# Patient Record
Sex: Female | Born: 1965 | Race: Black or African American | Hispanic: No | Marital: Married | Smoking: Never smoker
Health system: Southern US, Community
[De-identification: ages and names within clinical notes are randomized; demographics above are authoritative.]

## PROBLEM LIST (undated history)

## (undated) DIAGNOSIS — K759 Inflammatory liver disease, unspecified: Secondary | ICD-10-CM

## (undated) DIAGNOSIS — R011 Cardiac murmur, unspecified: Secondary | ICD-10-CM

## (undated) DIAGNOSIS — K219 Gastro-esophageal reflux disease without esophagitis: Secondary | ICD-10-CM

## (undated) DIAGNOSIS — M75100 Unspecified rotator cuff tear or rupture of unspecified shoulder, not specified as traumatic: Secondary | ICD-10-CM

## (undated) DIAGNOSIS — K259 Gastric ulcer, unspecified as acute or chronic, without hemorrhage or perforation: Secondary | ICD-10-CM

## (undated) DIAGNOSIS — Z9289 Personal history of other medical treatment: Secondary | ICD-10-CM

## (undated) DIAGNOSIS — I10 Essential (primary) hypertension: Secondary | ICD-10-CM

## (undated) DIAGNOSIS — R110 Nausea: Secondary | ICD-10-CM

## (undated) DIAGNOSIS — M7750 Other enthesopathy of unspecified foot: Secondary | ICD-10-CM

## (undated) DIAGNOSIS — T7840XA Allergy, unspecified, initial encounter: Secondary | ICD-10-CM

## (undated) DIAGNOSIS — M7660 Achilles tendinitis, unspecified leg: Secondary | ICD-10-CM

## (undated) DIAGNOSIS — M869 Osteomyelitis, unspecified: Secondary | ICD-10-CM

## (undated) DIAGNOSIS — M1990 Unspecified osteoarthritis, unspecified site: Secondary | ICD-10-CM

## (undated) DIAGNOSIS — K8020 Calculus of gallbladder without cholecystitis without obstruction: Secondary | ICD-10-CM

## (undated) HISTORY — DX: Allergy, unspecified, initial encounter: T78.40XA

## (undated) HISTORY — DX: Other enthesopathy of unspecified foot and ankle: M77.50

## (undated) HISTORY — DX: Calculus of gallbladder without cholecystitis without obstruction: K80.20

## (undated) HISTORY — DX: Unspecified rotator cuff tear or rupture of unspecified shoulder, not specified as traumatic: M75.100

## (undated) HISTORY — DX: Achilles tendinitis, unspecified leg: M76.60

## (undated) HISTORY — PX: OTHER SURGICAL HISTORY: SHX169

## (undated) HISTORY — DX: Gastric ulcer, unspecified as acute or chronic, without hemorrhage or perforation: K25.9

## (undated) HISTORY — DX: Nausea: R11.0

---
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
## 1986-01-15 HISTORY — PX: OTHER SURGICAL HISTORY: SHX169

## 1999-11-01 ENCOUNTER — Emergency Department (HOSPITAL_COMMUNITY): Admission: EM | Admit: 1999-11-01 | Discharge: 1999-11-01 | Payer: Self-pay | Admitting: Internal Medicine

## 2004-05-01 ENCOUNTER — Emergency Department (HOSPITAL_COMMUNITY): Admission: EM | Admit: 2004-05-01 | Discharge: 2004-05-01 | Payer: Self-pay | Admitting: Emergency Medicine

## 2004-05-12 ENCOUNTER — Ambulatory Visit: Payer: Self-pay | Admitting: Cardiology

## 2004-05-12 ENCOUNTER — Encounter: Payer: Self-pay | Admitting: Cardiology

## 2004-05-12 ENCOUNTER — Inpatient Hospital Stay (HOSPITAL_COMMUNITY): Admission: AD | Admit: 2004-05-12 | Discharge: 2004-05-16 | Payer: Self-pay | Admitting: Internal Medicine

## 2004-05-17 ENCOUNTER — Other Ambulatory Visit: Admission: RE | Admit: 2004-05-17 | Discharge: 2004-05-17 | Payer: Self-pay | Admitting: Obstetrics and Gynecology

## 2005-03-21 ENCOUNTER — Ambulatory Visit (HOSPITAL_COMMUNITY): Admission: RE | Admit: 2005-03-21 | Discharge: 2005-03-21 | Payer: Self-pay | Admitting: Internal Medicine

## 2005-05-28 ENCOUNTER — Other Ambulatory Visit: Admission: RE | Admit: 2005-05-28 | Discharge: 2005-05-28 | Payer: Self-pay | Admitting: Obstetrics and Gynecology

## 2006-05-30 ENCOUNTER — Other Ambulatory Visit: Admission: RE | Admit: 2006-05-30 | Discharge: 2006-05-30 | Payer: Self-pay | Admitting: Obstetrics and Gynecology

## 2006-05-30 ENCOUNTER — Ambulatory Visit (HOSPITAL_COMMUNITY): Admission: RE | Admit: 2006-05-30 | Discharge: 2006-05-30 | Payer: Self-pay | Admitting: Obstetrics and Gynecology

## 2007-07-12 ENCOUNTER — Emergency Department: Payer: Self-pay | Admitting: Unknown Physician Specialty

## 2007-07-30 ENCOUNTER — Ambulatory Visit (HOSPITAL_COMMUNITY): Admission: RE | Admit: 2007-07-30 | Discharge: 2007-07-30 | Payer: Self-pay | Admitting: Unknown Physician Specialty

## 2008-10-20 ENCOUNTER — Ambulatory Visit (HOSPITAL_COMMUNITY): Admission: RE | Admit: 2008-10-20 | Discharge: 2008-10-20 | Payer: Self-pay | Admitting: Unknown Physician Specialty

## 2008-11-05 ENCOUNTER — Encounter: Admission: RE | Admit: 2008-11-05 | Discharge: 2008-11-05 | Payer: Self-pay | Admitting: Unknown Physician Specialty

## 2009-10-24 ENCOUNTER — Ambulatory Visit (HOSPITAL_COMMUNITY): Admission: RE | Admit: 2009-10-24 | Discharge: 2009-10-24 | Payer: Self-pay | Admitting: Unknown Physician Specialty

## 2010-02-05 ENCOUNTER — Encounter: Payer: Self-pay | Admitting: Unknown Physician Specialty

## 2010-06-02 NOTE — H&P (Signed)
Taylor Hurst, Taylor Hurst             ACCOUNT NO.:  [id number]   MEDICAL RECORD NO.:  [id number]          PATIENT TYPE:  EMS   LOCATION:  MAJO                         FACILITY:  [facility]   PHYSICIAN:  Ladell Pier, M.D.   DATE OF BIRTH:  December 12, 1965   DATE OF ADMISSION:  05/01/2004  DATE OF DISCHARGE:  05/01/2004                                HISTORY & PHYSICAL   CHIEF COMPLAINT:  High blood pressure.   HISTORY OF PRESENT ILLNESS:  The patient is a 45 year old African American  female who has no significant past medical history. She presented to the GYN  office on April 17th with elevated blood pressure. She was sent from GYN  office to the emergency room. In the ED she was given a total of 0.5 mg of  clonidine, blood pressure went from 244/139 to 159/115. She was discharged  on clonidine 0.2 mg b.i.d. In our office her blood pressure was 230/140  after treatment and Diovan/HCTZ 160/25 with blood pressure 240/130. The  patient was admitted for blood pressure control. Blood pressure in the  office also went up as high as 240/170. She had no symptoms of chest pain.  No shortness of breath, headache, dizziness.   PAST MEDICAL HISTORY:  Newly diagnosed hypertension. She also has a history  of osteomyelitis of bilateral lower extremities, the left leg in August 1993  and the right in July 1988.   FAMILY HISTORY:  Her mother has hypertension and her brother has  hypertension. She has a sister in good health.   MEDICATIONS:  1.  Clonidine 0.1 mg b.i.d.  2.  Vitamin C daily.  3.  Zinc daily.  4.  Aspirin daily.   ALLERGIES:  She is allergic to PENICILLIN and BETADINE.   REVIEW OF SYSTEMS:  Negative other than was stated in the HPI.   PHYSICAL EXAMINATION:  VITAL SIGNS: Temperature 98.4, pulse 64 and recheck  136, respirations 12, blood pressure on recheck 230/140. Pulse oximetry 94%  on room air.  HEENT: Head is normocephalic and atraumatic. Pupils equal, round, and  reactive to  light. Throat without erythema.  CARDIOVASCULAR: Regular rate and rhythm. 2/6 systolic murmur.  LUNGS: Clear to auscultation bilaterally. No rales, rhonchi, or wheezes.  ABDOMEN: Soft, nontender, nondistended. Positive bowel sounds.  EXTREMITIES: Trace edema.   LABORATORY DATA:  She is getting CBC, CMP, urine drug screen, fasting lipid  panel, and TSH.   ASSESSMENT/PLAN:  1.  Hypertension. Her blood pressure is severely elevated, even though she      does not have any symptoms. She was recently in the ED, started on      clonidine, and is not responding to clonidine. She was given clonidine      in the office and is not responding to clonidine, so I will admit the      patient to observation, treat her hypertension, and work her up for this      hypertension and put her on good medication control. Obviously we will      have to get her blood pressure under control over a period of time.  2.  Left ventricular hypertrophy with left ventricular strain on      echocardiogram, plus murmur. This will improve with blood pressure      control. Will check 2-D echo and repeat and try to control blood      pressure.      NJ/MEDQ  D:  05/12/2004  T:  05/12/2004  Job:  [id number]

## 2010-06-02 NOTE — Discharge Summary (Signed)
NAMEMALYNDA, Taylor Hurst             ACCOUNT NO.:  [id number]   MEDICAL RECORD NO.:  [id number]          PATIENT TYPE:  INP   LOCATION:  4734                         FACILITY:  [facility]   PHYSICIAN:  Ladell Pier, M.D.   DATE OF BIRTH:  02-05-1965   DATE OF ADMISSION:  05/12/2004  DATE OF DISCHARGE:  05/16/2004                                 DISCHARGE SUMMARY   DISCHARGE DIAGNOSES:  1. Hypertensive urgency.  2. Left ventricular hypertrophy with left ventricular strain.  2-D      echocardiogram showed left ventricular hypertrophy, normal      contractility.     DISCHARGE MEDICATIONS:  1. Diovan/HCTZ 160/25 once a day.  2. Norvasc 10 mg twice a day.     FOLLOW-UP APPOINTMENT:  Patient to follow up in the office within one week.  Patient may return to work on May 18, 2004.   HISTORY OF PRESENT ILLNESS:  The patient is a 45 year old African-American  female.  She had no significant past medical history.  She has represented  to the GYN office on April 17 with elevated blood pressure.  She was sent to  the GYN office to the emergency room where she was treated for hypertension.  She presented to our office with elevated blood pressure in the 240s/130s.  We admitted her to the hospital for treatment of her hypertension.  Past  medical history, family history, social history, medication allergies,  review of systems per admission H&P.   PHYSICAL EXAMINATION:  VITAL SIGNS:  On discharge blood pressure temperature  98.6, pulse 67, respirations 16, blood pressure 125/73, pulse ox 93% on room  air.  HEENT:  Normocephalic, atraumatic.  Pupils are equal, round, and reactive to  light.  Throat without erythema.  CARDIOVASCULAR:  Regular rate and rhythm with a 2/6 systolic murmur.  ABDOMEN:  Positive bowel sounds.  EXTREMITIES:  Without edema.   HOSPITAL COURSE:  #1 - HYPERTENSIVE URGENCY:  She was admitted to the  stepdown unit where she was given IV labetalol to control her blood  pressure.  Her blood pressure took several days to respond.  She developed a  headache during the treatment of her hypertension.  On the second her blood  pressure had normalized to 125/73.  The medications were further adjusted  and she was discharged on the medications listed above.   #2 - HEART MURMUR:  She had an echocardiogram done that showed normal EF.  LVH with mildly dilated left atrium.   DISCHARGE LABORATORIES:  Sodium 139, potassium 3.7, creatinine 1.0, glucose  84.  WBC 6.8, hemoglobin 12.7, platelets 298.  Cholesterol:  Triglycerides  107, total cholesterol 193, HDL 53, LDL 119.  TSH was normal.  UDS was  negative.  TSH 0.842.  UA was also normal.  2-D echocardiogram showed an EF  of 55-65%.  There was no left ventricular regional wall motion abnormality.  LV wall thickness mildly increased and left atrium mildly dilated.      NJ/MEDQ  D:  05/16/2004  T:  05/16/2004  Job:  [id number]

## 2010-09-19 ENCOUNTER — Other Ambulatory Visit (HOSPITAL_COMMUNITY): Payer: Self-pay | Admitting: Unknown Physician Specialty

## 2010-09-19 DIAGNOSIS — Z1231 Encounter for screening mammogram for malignant neoplasm of breast: Secondary | ICD-10-CM

## 2010-10-27 ENCOUNTER — Ambulatory Visit (HOSPITAL_COMMUNITY)
Admission: RE | Admit: 2010-10-27 | Discharge: 2010-10-27 | Disposition: A | Discharge status: Home / Self Care | Payer: Managed Care, Other (non HMO) | Source: Ambulatory Visit | Attending: Unknown Physician Specialty | Admitting: Unknown Physician Specialty

## 2010-10-27 DIAGNOSIS — Z1231 Encounter for screening mammogram for malignant neoplasm of breast: Secondary | ICD-10-CM | POA: Insufficient documentation

## 2011-05-30 ENCOUNTER — Encounter (INDEPENDENT_AMBULATORY_CARE_PROVIDER_SITE_OTHER): Payer: Self-pay | Admitting: Surgery

## 2011-05-30 ENCOUNTER — Ambulatory Visit (INDEPENDENT_AMBULATORY_CARE_PROVIDER_SITE_OTHER): Payer: Managed Care, Other (non HMO) | Admitting: Surgery

## 2011-05-30 VITALS — BP 148/82 | HR 72 | Temp 97.4°F | Resp 18 | Ht 62.0 in | Wt 200.6 lb

## 2011-05-30 DIAGNOSIS — R12 Heartburn: Secondary | ICD-10-CM

## 2011-05-30 DIAGNOSIS — R748 Abnormal levels of other serum enzymes: Secondary | ICD-10-CM

## 2011-05-30 DIAGNOSIS — G4700 Insomnia, unspecified: Secondary | ICD-10-CM | POA: Insufficient documentation

## 2011-05-30 DIAGNOSIS — K732 Chronic active hepatitis, not elsewhere classified: Secondary | ICD-10-CM | POA: Insufficient documentation

## 2011-05-30 DIAGNOSIS — I10 Essential (primary) hypertension: Secondary | ICD-10-CM | POA: Insufficient documentation

## 2011-05-30 DIAGNOSIS — K219 Gastro-esophageal reflux disease without esophagitis: Secondary | ICD-10-CM | POA: Insufficient documentation

## 2011-05-30 DIAGNOSIS — K8020 Calculus of gallbladder without cholecystitis without obstruction: Secondary | ICD-10-CM

## 2011-05-30 DIAGNOSIS — K811 Chronic cholecystitis: Secondary | ICD-10-CM | POA: Insufficient documentation

## 2011-05-30 DIAGNOSIS — E669 Obesity, unspecified: Secondary | ICD-10-CM | POA: Insufficient documentation

## 2011-05-30 HISTORY — DX: Calculus of gallbladder without cholecystitis without obstruction: K80.20

## 2011-05-30 NOTE — Progress Notes (Signed)
Subjective:     Patient ID: Taylor Hurst, female   DOB: 04-22-1965, 46 y.o.   MRN: [id number]  HPI  Taylor Hurst  Nov 15, 1965 [id number]  Patient Care Team: Lavell Islam, MD as PCP - General (Internal Medicine) Theda Belfast, MD as Consulting Physician (Gastroenterology)  This patient is a 46 y.o.female who presents today for surgical evaluation at the request of Dr. Elnoria Howard.   Reason for visit: Elevated liver function tests and gallstones. Possible biliary etiology.  Patient is an obese pleasant female. She's had episodes of intermittent abdominal discomfort and nausea. Occasional pain. Usually supraumbilical. Right or worse than left. She tends to have a high-fiber diet and tries to avoid fatty greasy foods. She went into a care facility. She had elevated liver function test. She'll followup with gastroenterologist. Dr. Elnoria Howard was concern about possible biliary etiology. He sent the patient to [place] for surgical evaluation. An ultrasound showed gallstones. The ultrasound repeated due to imaging issues & I do not have the full report. Her liver function tests were improved but not normal.  The patient notes she gets some mild reflux but no true heartburn (?!). She had a trial of omeprazole and seemed to help. However, she still gets an occasional symptoms of nausea and bloating. It is rather annoying. Feels the right upper quadrant swells up.  Normally has a bowel movement every day. Rather physically active. No history of abdominal surgeries.  Patient Active Problem List  Diagnoses  . HTN (hypertension)  . Insomnia  . Obesity (BMI 30-39.9)  . Abnormal transaminases  . Gallstones, probabale biliary colic  . Heartburn    Past Medical History  Diagnosis Date  . Nausea     Past Surgical History  Procedure Date  . Osteomilitis     right and left leg/surgery; right 1983; left 1988    History   Social History  . Marital Status: Married    Spouse Name: N/A    Number of Children:  N/A  . Years of Education: N/A   Occupational History  . Not on file.   Social History Main Topics  . Smoking status: Never Smoker   . Smokeless tobacco: Not on file  . Alcohol Use: Yes  . Drug Use:   . Sexually Active:    Other Topics Concern  . Not on file   Social History Narrative  . No narrative on file    Family History  Problem Relation Age of Onset  . COPD Mother   . Hypertension Mother   . Hyperlipidemia Mother   . Alcohol abuse Father   . Cancer Maternal Aunt     breast    Current Outpatient Prescriptions  Medication Sig Dispense Refill  . amLODipine (NORVASC) 10 MG tablet Take 10 mg by mouth daily.      Marland Kitchen b complex vitamins tablet Take 1 tablet by mouth daily.      . Black Cohosh 540 MG CAPS Take by mouth daily.      . fish oil-omega-3 fatty acids 1000 MG capsule Take 1,000 mg by mouth daily.      Marland Kitchen levonorgestrel (MIRENA) 20 MCG/24HR IUD 1 each by Intrauterine route once. 20 mcg/24 hr IUD      . Melatonin 5 MG CAPS Take by mouth. Takes 2 at bedtime      . Multiple Vitamin (MULTI-VITAMIN DAILY PO) Take by mouth. Women's      . omeprazole (PRILOSEC) 20 MG capsule Take 20 mg by mouth daily.      Marland Kitchen  valsartan-hydrochlorothiazide (DIOVAN-HCT) 320-25 MG per tablet Take 1 tablet by mouth daily.         Allergies not on file  BP 148/82  Pulse 72  Temp(Src) 97.4 F (36.3 C) (Temporal)  Resp 18  Ht 5\' 2"  (1.575 m)  Wt 200 lb 9.6 oz (90.992 kg)  BMI 36.69 kg/m2  No results found.   Review of Systems  Constitutional: Negative for fever, chills, diaphoresis, appetite change and fatigue.  HENT: Negative for ear pain, sore throat, trouble swallowing, neck pain and ear discharge.   Eyes: Negative for photophobia, discharge and visual disturbance.  Respiratory: Negative for cough, choking, chest tightness and shortness of breath.   Cardiovascular: Negative for chest pain and palpitations.  Gastrointestinal: Negative for nausea, vomiting, abdominal pain,  diarrhea, constipation, anal bleeding and rectal pain.  Genitourinary: Negative for dysuria, frequency and difficulty urinating.  Musculoskeletal: Negative for myalgias and gait problem.  Skin: Negative for color change, pallor and rash.  Neurological: Negative for dizziness, speech difficulty, weakness and numbness.  Hematological: Negative for adenopathy.  Psychiatric/Behavioral: Negative for confusion and agitation. The patient is not nervous/anxious.        Objective:   Physical Exam  Constitutional: She is oriented to person, place, and time. She appears well-developed and well-nourished. No distress.  HENT:  Head: Normocephalic.  Mouth/Throat: Oropharynx is clear and moist. No oropharyngeal exudate.  Eyes: Conjunctivae and EOM are normal. Pupils are equal, round, and reactive to light. No scleral icterus.  Neck: Normal range of motion. Neck supple. No tracheal deviation present.  Cardiovascular: Normal rate, regular rhythm and intact distal pulses.   Pulmonary/Chest: Effort normal and breath sounds normal. No respiratory distress. She exhibits no tenderness.  Abdominal: Soft. She exhibits no distension and no mass. There is tenderness in the epigastric area. There is no CVA tenderness and negative Murphy's sign. No hernia. Hernia confirmed negative in the right inguinal area and confirmed negative in the left inguinal area.         Obese  Genitourinary: No vaginal discharge found.  Musculoskeletal: Normal range of motion. She exhibits no tenderness.  Lymphadenopathy:    She has no cervical adenopathy.       Right: No inguinal adenopathy present.       Left: No inguinal adenopathy present.  Neurological: She is alert and oriented to person, place, and time. No cranial nerve deficit. She exhibits normal muscle tone. Coordination normal.  Skin: Skin is warm and dry. No rash noted. She is not diaphoretic. No erythema.  Psychiatric: She has a normal mood and affect. Her behavior is  normal. Judgment and thought content normal.       Assessment:     Elevated liver function tests.  Gallstones  Mild heartburn  I suspect she has biliary colic on top of heartburn issues. Possible fatty change in liver.    Plan:      Because she's had elevated liver function tests, right-sided swelling bloating pain, and persistent symptoms even while on omeprazole; I think the gallbladder is at least part of the problem.  I want to get the final report on the ultrasound. At the initial liver function tests. I would have a low threshold to consider a Tru-Cut core liver biopsy at the time of surgery if it looks abnormal.  I think she would benefit cholecystectomy. Very seen a gastroenterologist. I noted if she wished to have upper endoscopy in a more aggressive GI evaluation, that is reasonable. However she does not give  me any other symptoms such as cardiopulmonary issues her gastroparesis. No history of hepatitis or every alcohol use. No abnormal medicines. She is leaning towards proceeding with surgery.  The anatomy & physiology of hepatobiliary & pancreatic function was discussed.  The pathophysiology of gallbladder dysfunction was discussed.  Natural history risks without surgery was discussed.   I feel the risks of no intervention will lead to serious problems that outweigh the operative risks; therefore, I recommended cholecystectomy to remove the pathology.  I explained laparoscopic techniques with possible need for an open approach.  Probable cholangiogram to evaluate the bilary tract was explained as well.    Risks such as bleeding, infection, abscess, leak, injury to other organs, need for further treatment, heart attack, death, and other risks were discussed.  I noted a good likelihood this will help address the problem.  Possibility that this will not correct all abdominal symptoms was explained.  Goals of post-operative recovery were discussed as well.  We will work to minimize  complications.  An educational handout further explaining the pathology and treatment options was given as well.  Questions were answered.  The patient expresses understanding & wishes to proceed with surgery.

## 2011-05-30 NOTE — Patient Instructions (Addendum)
See the Handout(s) we gave you.  Consider surgery.  Please call our office at [phone] if you wish to schedule surgery or if you have further questions / concerns.    Cholelithiasis Cholelithiasis (also called gallstones) is a form of gallbladder disease where gallstones form in your gallbladder. The gallbladder is a non-essential organ that stores bile made in the liver, which helps digest fats. Gallstones begin as small crystals and slowly grow into stones. Gallstone pain occurs when the gallbladder spasms, and a gallstone is blocking the duct. Pain can also occur when a stone passes out of the duct.  Women are more likely to develop gallstones than men. Other factors that increase the risk of gallbladder disease are:  Having multiple pregnancies. Physicians sometimes advise removing diseased gallbladders before future pregnancies.   Obesity.   Diets heavy in fried foods and fat.   Increasing age (older than 15).   Prolonged use of medications containing female hormones.   Diabetes mellitus.   Rapid weight loss.   Family history of gallstones (heredity).  SYMPTOMS  Feeling sick to your stomach (nauseous).   Abdominal pain.   Yellowing of the skin (jaundice).   Sudden pain. It may persist from several minutes to several hours.   Worsening pain with deep breathing or when jarred.   Fever.   Tenderness to the touch.  In some cases, when gallstones do not move into the bile duct, people have no pain or symptoms. These are called "silent" gallstones. TREATMENT In severe cases, emergency surgery may be required. HOME CARE INSTRUCTIONS   Only take over-the-counter or prescription medicines for pain, discomfort, or fever as directed by your caregiver.   Follow a low-fat diet until seen again. Fat causes the gallbladder to contract, which can result in pain.   Follow up as instructed. Attacks are almost always recurrent and surgery is usually required for permanent  treatment.  SEEK IMMEDIATE MEDICAL CARE IF:   Your pain increases and is not controlled by medications.   You have an oral temperature above 102 F (38.9 C), not controlled by medication.   You develop nausea and vomiting.  MAKE SURE YOU:   Understand these instructions.   Will watch your condition.   Will get help right away if you are not doing well or get worse.  Document Released: 12/28/2004 Document Revised: 12/21/2010 Document Reviewed: 03/02/2010 [facility] Patient Information 2012 [place], [place].  Heartburn Heartburn is a painful, burning sensation in the chest. It may feel worse in certain positions, such as lying down or bending over. It is caused by stomach acid backing up into the tube that carries food from the mouth down to the stomach (lower esophagus).  CAUSES   Large meals.   Certain foods and drinks.   Exercise.   Increased acid production.   Being overweight or obese.   Certain medicines.  SYMPTOMS   Burning pain in the chest or lower throat.   Bitter taste in the mouth.   Coughing.  DIAGNOSIS  If the usual treatments for heartburn do not improve your symptoms, then tests may be done to see if there is another condition present. Possible tests may include:  X-rays.   Endoscopy. This is when a tube with a light and a camera on the end is used to examine the esophagus and the stomach.   A test to measure the amount of acid in the esophagus (pH test).   A test to see if the esophagus is working properly (  esophageal manometry).   Blood, breath, or stool tests to check for bacteria that cause ulcers.  TREATMENT   Your caregiver may tell you to use certain over-the-counter medicines (antacids, acid reducers) for mild heartburn.   Your caregiver may prescribe medicines to decrease the acid in your stomach or protect your stomach lining.   Your caregiver may recommend certain diet changes.   For severe cases, your caregiver may recommend that the  head of your bed be elevated on blocks. (Sleeping with more pillows is not an effective treatment as it only changes the position of your head and does not improve the main problem of stomach acid refluxing into the esophagus.)  HOME CARE INSTRUCTIONS   Take all medicines as directed by your caregiver.   Raise the head of your bed by putting blocks under the legs if instructed to by your caregiver.   Do not exercise right after eating.   Avoid eating 2 or 3 hours before bed. Do not lie down right after eating.   Eat small meals throughout the day instead of 3 large meals.   Stop smoking if you smoke.   Maintain a healthy weight.   Identify foods and beverages that make your symptoms worse and avoid them. Foods you may want to avoid include:   Peppers.   Chocolate.   High-fat foods, including fried foods.   Spicy foods.   Garlic and onions.   Citrus fruits, including oranges, grapefruit, lemons, and limes.   Food containing tomatoes or tomato products.   Mint.   Carbonated drinks, caffeinated drinks, and alcohol.   Vinegar.  SEEK IMMEDIATE MEDICAL CARE IF:  You have severe chest pain that goes down your arm or into your jaw or neck.   You feel sweaty, dizzy, or lightheaded.   You are short of breath.   You vomit blood.   You have difficulty or pain with swallowing.   You have bloody or black, tarry stools.   You have episodes of heartburn more than 3 times a week for more than 2 weeks.  MAKE SURE YOU:  Understand these instructions.   Will watch your condition.   Will get help right away if you are not doing well or get worse.  Document Released: 05/20/2008 Document Revised: 12/21/2010 Document Reviewed: 06/18/2010 [facility] Patient Information 2012 [place], [place].

## 2011-06-06 ENCOUNTER — Encounter (INDEPENDENT_AMBULATORY_CARE_PROVIDER_SITE_OTHER): Payer: Self-pay

## 2011-07-13 ENCOUNTER — Encounter (INDEPENDENT_AMBULATORY_CARE_PROVIDER_SITE_OTHER): Payer: Self-pay

## 2011-07-13 ENCOUNTER — Encounter (HOSPITAL_COMMUNITY): Payer: Self-pay | Admitting: Pharmacy Technician

## 2011-07-18 ENCOUNTER — Ambulatory Visit (HOSPITAL_COMMUNITY)
Admission: RE | Admit: 2011-07-18 | Discharge: 2011-07-18 | Disposition: A | Discharge status: Home / Self Care | Payer: Managed Care, Other (non HMO) | Source: Ambulatory Visit | Attending: Surgery | Admitting: Surgery

## 2011-07-18 ENCOUNTER — Encounter (HOSPITAL_COMMUNITY): Payer: Self-pay

## 2011-07-18 ENCOUNTER — Encounter (HOSPITAL_COMMUNITY)
Admission: RE | Admit: 2011-07-18 | Discharge: 2011-07-18 | Disposition: A | Discharge status: Home / Self Care | Payer: Managed Care, Other (non HMO) | Source: Ambulatory Visit | Attending: Surgery | Admitting: Surgery

## 2011-07-18 DIAGNOSIS — Z01812 Encounter for preprocedural laboratory examination: Secondary | ICD-10-CM | POA: Insufficient documentation

## 2011-07-18 DIAGNOSIS — Z01810 Encounter for preprocedural cardiovascular examination: Secondary | ICD-10-CM | POA: Insufficient documentation

## 2011-07-18 DIAGNOSIS — K8020 Calculus of gallbladder without cholecystitis without obstruction: Secondary | ICD-10-CM | POA: Insufficient documentation

## 2011-07-18 HISTORY — DX: Unspecified osteoarthritis, unspecified site: M19.90

## 2011-07-18 HISTORY — DX: Gastro-esophageal reflux disease without esophagitis: K21.9

## 2011-07-18 HISTORY — DX: Essential (primary) hypertension: I10

## 2011-07-18 LAB — CBC
MCV: 90.9 fL (ref 78.0–100.0)
Platelets: 318 10*3/uL (ref 150–400)
RBC: 4.3 MIL/uL (ref 3.87–5.11)
RDW: 12.7 % (ref 11.5–15.5)
WBC: 6.8 10*3/uL (ref 4.0–10.5)

## 2011-07-18 LAB — BASIC METABOLIC PANEL
CO2: 30 mEq/L (ref 19–32)
Chloride: 100 mEq/L (ref 96–112)
Creatinine, Ser: 0.85 mg/dL (ref 0.50–1.10)
GFR calc Af Amer: >90 mL/min (ref >90)
Potassium: 4.2 mEq/L (ref 3.5–5.1)
Sodium: 138 mEq/L (ref 135–145)

## 2011-07-18 LAB — SURGICAL PCR SCREEN: Staphylococcus aureus: NEGATIVE

## 2011-07-18 LAB — HCG, SERUM, QUALITATIVE: Preg, Serum: NEGATIVE

## 2011-07-18 NOTE — Patient Instructions (Signed)
20 Taylor Hurst  07/18/2011   Your procedure is scheduled on:  Thursday 07/26/2011 at 0730 am  Report to [facility] at 0530 AM.  Call this number if you have problems the morning of surgery: [phone]   Remember:   Do not eat food:After Midnight.  May have clear liquids:until Midnight .    Take these medicines the morning of surgery with A SIP OF WATER: Norvasc, Prilosec   Do not wear jewelry, make-up or nail polish.  Do not wear lotions, powders, or perfumes.   Do not shave 48 hours prior to surgery. Men may shave face and neck.  Do not bring valuables to the hospital.  Contacts, dentures or bridgework may not be worn into surgery.       Patients discharged the day of surgery will not be allowed to drive home.  Name and phone number of your driver: Sherrilee Gilles [phone]  Special Instructions: CHG Shower Use Special Wash: 1/2 bottle night before surgery and 1/2 bottle morning of surgery.   Please read over the following fact sheets that you were given: MRSA Information, Sleep apnea sheet, Incentive Spirometry Sheet                If you have any questions, call Telford Nab.Georgeanna Lea, RN,BSN at [phone]

## 2011-07-26 ENCOUNTER — Ambulatory Visit (HOSPITAL_COMMUNITY)
Admission: RE | Admit: 2011-07-26 | Discharge: 2011-07-26 | Disposition: A | Discharge status: Home / Self Care | Payer: Managed Care, Other (non HMO) | Source: Ambulatory Visit | Attending: Surgery | Admitting: Surgery

## 2011-07-26 ENCOUNTER — Ambulatory Visit (HOSPITAL_COMMUNITY): Payer: Managed Care, Other (non HMO) | Admitting: Registered Nurse

## 2011-07-26 ENCOUNTER — Encounter (HOSPITAL_COMMUNITY): Payer: Self-pay | Admitting: Registered Nurse

## 2011-07-26 ENCOUNTER — Encounter (HOSPITAL_COMMUNITY): Payer: Self-pay

## 2011-07-26 ENCOUNTER — Ambulatory Visit (HOSPITAL_COMMUNITY): Payer: Managed Care, Other (non HMO)

## 2011-07-26 ENCOUNTER — Encounter (HOSPITAL_COMMUNITY)
Admission: RE | Disposition: A | Discharge status: Home / Self Care | Payer: Self-pay | Source: Ambulatory Visit | Attending: Surgery

## 2011-07-26 DIAGNOSIS — Z6830 Body mass index (BMI) 30.0-30.9, adult: Secondary | ICD-10-CM | POA: Insufficient documentation

## 2011-07-26 DIAGNOSIS — R7401 Elevation of levels of liver transaminase levels: Secondary | ICD-10-CM | POA: Insufficient documentation

## 2011-07-26 DIAGNOSIS — K811 Chronic cholecystitis: Secondary | ICD-10-CM | POA: Diagnosis present

## 2011-07-26 DIAGNOSIS — K732 Chronic active hepatitis, not elsewhere classified: Secondary | ICD-10-CM | POA: Diagnosis present

## 2011-07-26 DIAGNOSIS — E669 Obesity, unspecified: Secondary | ICD-10-CM | POA: Insufficient documentation

## 2011-07-26 DIAGNOSIS — K8010 Calculus of gallbladder with chronic cholecystitis without obstruction: Secondary | ICD-10-CM

## 2011-07-26 DIAGNOSIS — K738 Other chronic hepatitis, not elsewhere classified: Secondary | ICD-10-CM | POA: Insufficient documentation

## 2011-07-26 DIAGNOSIS — R7402 Elevation of levels of lactic acid dehydrogenase (LDH): Secondary | ICD-10-CM | POA: Insufficient documentation

## 2011-07-26 DIAGNOSIS — I10 Essential (primary) hypertension: Secondary | ICD-10-CM | POA: Insufficient documentation

## 2011-07-26 HISTORY — PX: CHOLECYSTECTOMY: SHX55

## 2011-07-26 HISTORY — PX: LIVER BIOPSY: SHX301

## 2011-07-26 HISTORY — PX: INTRAOPERATIVE CHOLANGIOGRAM: SHX5230

## 2011-07-26 SURGERY — LAPAROSCOPIC CHOLECYSTECTOMY SINGLE SITE
Anesthesia: General | Wound class: Clean Contaminated

## 2011-07-26 MED ORDER — CISATRACURIUM BESYLATE (PF) 10 MG/5ML IV SOLN
INTRAVENOUS | Status: DC | PRN
  Administered 2011-07-26: 8 mg via INTRAVENOUS

## 2011-07-26 MED ORDER — DEXAMETHASONE SODIUM PHOSPHATE 10 MG/ML IJ SOLN
INTRAMUSCULAR | Status: DC | PRN
  Administered 2011-07-26: 10 mg via INTRAVENOUS

## 2011-07-26 MED ORDER — HYDROMORPHONE HCL PF 1 MG/ML IJ SOLN
0.2500 mg | INTRAMUSCULAR | Status: DC | PRN
  Administered 2011-07-26: 0.5 mg via INTRAVENOUS

## 2011-07-26 MED ORDER — IOHEXOL 300 MG/ML  SOLN
INTRAMUSCULAR | Status: AC
  Filled 2011-07-26: qty 1

## 2011-07-26 MED ORDER — LACTATED RINGERS IR SOLN
Status: DC | PRN
  Administered 2011-07-26: 1000 mL

## 2011-07-26 MED ORDER — BUPIVACAINE-EPINEPHRINE PF 0.25-1:200000 % IJ SOLN
INTRAMUSCULAR | Status: DC | PRN
  Administered 2011-07-26: 60 mL

## 2011-07-26 MED ORDER — ONDANSETRON HCL 4 MG/2ML IJ SOLN
INTRAMUSCULAR | Status: DC | PRN
  Administered 2011-07-26: 4 mg via INTRAVENOUS

## 2011-07-26 MED ORDER — GLYCOPYRROLATE 0.2 MG/ML IJ SOLN
INTRAMUSCULAR | Status: DC | PRN
  Administered 2011-07-26: .4 mg via INTRAVENOUS

## 2011-07-26 MED ORDER — OXYCODONE HCL 5 MG PO TABS
ORAL_TABLET | ORAL | Status: AC
  Filled 2011-07-26: qty 1

## 2011-07-26 MED ORDER — LIDOCAINE HCL 4 % MT SOLN
OROMUCOSAL | Status: DC | PRN
  Administered 2011-07-26: 4 mL via TOPICAL

## 2011-07-26 MED ORDER — BUPIVACAINE-EPINEPHRINE PF 0.25-1:200000 % IJ SOLN
INTRAMUSCULAR | Status: AC
  Filled 2011-07-26: qty 60

## 2011-07-26 MED ORDER — LIDOCAINE HCL (CARDIAC) 20 MG/ML IV SOLN
INTRAVENOUS | Status: DC | PRN
  Administered 2011-07-26: 70 mg via INTRAVENOUS

## 2011-07-26 MED ORDER — PROPOFOL 10 MG/ML IV EMUL
INTRAVENOUS | Status: DC | PRN
  Administered 2011-07-26: 50 mg via INTRAVENOUS
  Administered 2011-07-26: 200 mg via INTRAVENOUS

## 2011-07-26 MED ORDER — LABETALOL HCL 5 MG/ML IV SOLN
INTRAVENOUS | Status: DC | PRN
  Administered 2011-07-26: 5 mg via INTRAVENOUS

## 2011-07-26 MED ORDER — MIDAZOLAM HCL 5 MG/5ML IJ SOLN
INTRAMUSCULAR | Status: DC | PRN
  Administered 2011-07-26: 2 mg via INTRAVENOUS

## 2011-07-26 MED ORDER — HYDROMORPHONE HCL PF 1 MG/ML IJ SOLN
INTRAMUSCULAR | Status: AC
  Filled 2011-07-26: qty 1

## 2011-07-26 MED ORDER — OXYCODONE HCL 5 MG PO TABS: 5.0000 mg | ORAL_TABLET | ORAL | Status: AC | PRN

## 2011-07-26 MED ORDER — LACTATED RINGERS IV SOLN: INTRAVENOUS | Status: DC

## 2011-07-26 MED ORDER — NEOSTIGMINE METHYLSULFATE 1 MG/ML IJ SOLN
INTRAMUSCULAR | Status: DC | PRN
  Administered 2011-07-26: 3 mg via INTRAVENOUS

## 2011-07-26 MED ORDER — OXYCODONE HCL 5 MG PO TABS
5.0000 mg | ORAL_TABLET | ORAL | Status: DC | PRN
  Administered 2011-07-26: 5 mg via ORAL

## 2011-07-26 MED ORDER — LACTATED RINGERS IV SOLN
INTRAVENOUS | Status: DC | PRN
  Administered 2011-07-26 (×2): via INTRAVENOUS

## 2011-07-26 MED ORDER — IOHEXOL 300 MG/ML  SOLN
INTRAMUSCULAR | Status: DC | PRN
  Administered 2011-07-26: 8.5 mL

## 2011-07-26 MED ORDER — SUCCINYLCHOLINE CHLORIDE 20 MG/ML IJ SOLN
INTRAMUSCULAR | Status: DC | PRN
  Administered 2011-07-26: 100 mg via INTRAVENOUS

## 2011-07-26 MED ORDER — 0.9 % SODIUM CHLORIDE (POUR BTL) OPTIME
TOPICAL | Status: DC | PRN
  Administered 2011-07-26: 1000 mL

## 2011-07-26 MED ORDER — SUFENTANIL CITRATE 50 MCG/ML IV SOLN
INTRAVENOUS | Status: DC | PRN
  Administered 2011-07-26 (×2): 15 ug via INTRAVENOUS
  Administered 2011-07-26 (×2): 10 ug via INTRAVENOUS

## 2011-07-26 SURGICAL SUPPLY — 54 items
APPLIER CLIP 5 13 M/L LIGAMAX5 (MISCELLANEOUS) ×2
APPLIER CLIP ROT 10 11.4 M/L (STAPLE)
APR CLP MED LRG 11.4X10 (STAPLE)
APR CLP MED LRG 5 ANG JAW (MISCELLANEOUS) ×1
BAG SPEC RTRVL LRG 6X4 10 (ENDOMECHANICALS) ×1
CABLE HIGH FREQUENCY MONO STRZ (ELECTRODE) ×2 IMPLANT
CANISTER SUCTION 2500CC (MISCELLANEOUS) ×2 IMPLANT
CHLORAPREP W/TINT 26ML (MISCELLANEOUS) ×1 IMPLANT
CLIP APPLIE 5 13 M/L LIGAMAX5 (MISCELLANEOUS) IMPLANT
CLIP APPLIE ROT 10 11.4 M/L (STAPLE) ×1 IMPLANT
CLOTH BEACON ORANGE TIMEOUT ST (SAFETY) ×2 IMPLANT
COVER MAYO STAND STRL (DRAPES) ×2 IMPLANT
DECANTER SPIKE VIAL GLASS SM (MISCELLANEOUS) ×1 IMPLANT
DRAPE C-ARM 42X72 X-RAY (DRAPES) ×2 IMPLANT
DRAPE LAPAROSCOPIC ABDOMINAL (DRAPES) ×2 IMPLANT
DRAPE UTILITY XL STRL (DRAPES) ×1 IMPLANT
DRAPE WARM FLUID 44X44 (DRAPE) ×2 IMPLANT
DRESSING TELFA 8X3 (GAUZE/BANDAGES/DRESSINGS) ×1 IMPLANT
DRSG TEGADERM 2-3/8X2-3/4 SM (GAUZE/BANDAGES/DRESSINGS) ×1 IMPLANT
DRSG TEGADERM 4X4.75 (GAUZE/BANDAGES/DRESSINGS) ×3 IMPLANT
ELECT REM PT RETURN 9FT ADLT (ELECTROSURGICAL) ×2
ELECTRODE REM PT RTRN 9FT ADLT (ELECTROSURGICAL) ×1 IMPLANT
GAUZE SPONGE 2X2 8PLY STRL LF (GAUZE/BANDAGES/DRESSINGS) IMPLANT
GLOVE BIOGEL PI IND STRL 7.0 (GLOVE) ×1 IMPLANT
GLOVE BIOGEL PI INDICATOR 7.0 (GLOVE)
GLOVE ECLIPSE 8.0 STRL XLNG CF (GLOVE) ×2 IMPLANT
GLOVE INDICATOR 8.0 STRL GRN (GLOVE) ×2 IMPLANT
GOWN STRL NON-REIN LRG LVL3 (GOWN DISPOSABLE) ×2 IMPLANT
GOWN STRL REIN XL XLG (GOWN DISPOSABLE) ×4 IMPLANT
IV LACTATED RINGER IRRG 3000ML (IV SOLUTION)
IV LACTATED RINGERS 1000ML (IV SOLUTION) ×1 IMPLANT
IV LR IRRIG 3000ML ARTHROMATIC (IV SOLUTION) ×1 IMPLANT
KIT BASIN OR (CUSTOM PROCEDURE TRAY) ×2 IMPLANT
NDL BIOPSY 14X6 SOFT TISS (NEEDLE) IMPLANT
NEEDLE BIOPSY 14X6 SOFT TISS (NEEDLE) ×2 IMPLANT
NS IRRIG 1000ML POUR BTL (IV SOLUTION) ×2 IMPLANT
POUCH SPECIMEN RETRIEVAL 10MM (ENDOMECHANICALS) ×1 IMPLANT
SCALPEL HARMONIC ACE (MISCELLANEOUS) ×1 IMPLANT
SCISSORS LAP 5X35 DISP (ENDOMECHANICALS) ×2 IMPLANT
SET CHOLANGIOGRAPH MIX (MISCELLANEOUS) ×1 IMPLANT
SET IRRIG TUBING LAPAROSCOPIC (IRRIGATION / IRRIGATOR) ×2 IMPLANT
SLEEVE Z-THREAD 5X100MM (TROCAR) ×1 IMPLANT
SOLUTION ANTI FOG 6CC (MISCELLANEOUS) ×1 IMPLANT
SPONGE GAUZE 2X2 STER 10/PKG (GAUZE/BANDAGES/DRESSINGS) ×1
STRIP CLOSURE SKIN 1/2X4 (GAUZE/BANDAGES/DRESSINGS) ×1 IMPLANT
SUT MNCRL AB 4-0 PS2 18 (SUTURE) ×2 IMPLANT
SUT VICRYL 0 TIES 12 18 (SUTURE) IMPLANT
TOWEL OR 17X26 10 PK STRL BLUE (TOWEL DISPOSABLE) ×3 IMPLANT
TOWEL OR NON WOVEN STRL DISP B (DISPOSABLE) ×1 IMPLANT
TRAY LAP CHOLE (CUSTOM PROCEDURE TRAY) ×2 IMPLANT
TROCAR 5M 150ML BLDLS (TROCAR) ×2 IMPLANT
TROCAR Z-THREAD FIOS 11X100 BL (TROCAR) IMPLANT
TROCAR Z-THREAD FIOS 5X100MM (TROCAR) ×2 IMPLANT
TUBING INSUFFLATION 10FT LAP (TUBING) ×2 IMPLANT

## 2011-07-26 NOTE — Anesthesia Preprocedure Evaluation (Signed)
Anesthesia Evaluation  Patient identified by MRN, date of birth, ID band Patient awake    Reviewed: Allergy & Precautions, H&P , NPO status , Patient's Chart, lab work & pertinent test results, reviewed documented beta blocker date and time   Airway Mallampati: I TM Distance: >3 FB Neck ROM: Full    Dental  (+) Teeth Intact and Dental Advisory Given   Pulmonary neg pulmonary ROS,  breath sounds clear to auscultation        Cardiovascular hypertension, Pt. on medications Rhythm:Regular Rate:Normal  Denies cardiac symptoms   Neuro/Psych negative neurological ROS  negative psych ROS   GI/Hepatic Neg liver ROS, gallstones   Endo/Other  negative endocrine ROS  Renal/GU negative Renal ROS  negative genitourinary   Musculoskeletal negative musculoskeletal ROS (+)   Abdominal   Peds negative pediatric ROS (+)  Hematology negative hematology ROS (+)   Anesthesia Other Findings   Reproductive/Obstetrics negative OB ROS                           Anesthesia Physical Anesthesia Plan  ASA: II  Anesthesia Plan: General   Post-op Pain Management:    Induction: Intravenous  Airway Management Planned: Oral ETT  Additional Equipment:   Intra-op Plan:   Post-operative Plan: Extubation in OR  Informed Consent: I have reviewed the patients History and Physical, chart, labs and discussed the procedure including the risks, benefits and alternatives for the proposed anesthesia with the patient or authorized representative who has indicated his/her understanding and acceptance.   Dental advisory given  Plan Discussed with: CRNA and Surgeon  Anesthesia Plan Comments:         Anesthesia Quick Evaluation

## 2011-07-26 NOTE — Anesthesia Postprocedure Evaluation (Signed)
  Anesthesia Post-op Note  Patient: Taylor Hurst  Procedure(s) Performed: Procedure(s) (LRB): LAPAROSCOPIC CHOLECYSTECTOMY SINGLE PORT (N/A) INTRAOPERATIVE CHOLANGIOGRAM (N/A) LIVER BIOPSY (N/A)  Patient Location: PACU  Anesthesia Type: General  Level of Consciousness: oriented and sedated  Airway and Oxygen Therapy: Patient Spontanous Breathing and Patient connected to nasal cannula oxygen  Post-op Pain: mild  Post-op Assessment: Post-op Vital signs reviewed, Patient's Cardiovascular Status Stable, Respiratory Function Stable and Patent Airway  Post-op Vital Signs: stable  Complications: No apparent anesthesia complications

## 2011-07-26 NOTE — H&P (Signed)
Andreana Burstein  03-03-1965 [id number]  CARE TEAM:  PCP: Lavell Islam, MD  Outpatient Care Team: Patient Care Team: Lavell Islam, MD as PCP - General (Internal Medicine) Theda Belfast, MD as Consulting Physician (Gastroenterology)  Inpatient Treatment Team: Treatment Team: Attending Provider: Ardeth Sportsman, MD   This patient is a 46 y.o.female who presents today for surgical evaluation at the request of Dr. Elnoria Howard.  Reason for visit: Elevated liver function tests and gallstones. Possible biliary etiology.  Patient is an obese pleasant female. She's had episodes of intermittent abdominal discomfort and nausea. Occasional pain. Usually supraumbilical. Right or worse than left. She tends to have a high-fiber diet and tries to avoid fatty greasy foods. She went into a care facility. She had elevated liver function test. She'll followup with gastroenterologist. Dr. Elnoria Howard was concern about possible biliary etiology. He sent the patient to [place] for surgical evaluation. An ultrasound showed gallstones. The ultrasound repeated due to imaging issues & I do not have the full report. Her liver function tests were improved but not normal.  The patient notes she gets some mild reflux but no true heartburn (?!). She had a trial of omeprazole and seemed to help. However, she still gets an occasional symptoms of nausea and bloating. It is rather annoying. Feels the right upper quadrant swells up.  Normally has a bowel movement every day. Rather physically active. No history of abdominal surgeries.   No new events  Patient Active Problem List  Diagnosis  . HTN (hypertension)  . Insomnia  . Obesity (BMI 30-39.9)  . Abnormal transaminases  . Gallstones, probabale biliary colic  . Chronic heartburn    Past Medical History  Diagnosis Date  . Nausea   . Hypertension   . GERD (gastroesophageal reflux disease)   . Arthritis     Past Surgical History  Procedure Date  . Osteomilitis     right and  left leg/surgery; right 1983; left 1988  . Osteomylitis 4098,1191    right leg, left leg    History   Social History  . Marital Status: Married    Spouse Name: N/A    Number of Children: N/A  . Years of Education: N/A   Occupational History  . Not on file.   Social History Main Topics  . Smoking status: Never Smoker   . Smokeless tobacco: Not on file  . Alcohol Use: No  . Drug Use: No  . Sexually Active: Yes    Birth Control/ Protection: IUD   Other Topics Concern  . Not on file   Social History Narrative  . No narrative on file    Family History  Problem Relation Age of Onset  . COPD Mother   . Hypertension Mother   . Hyperlipidemia Mother   . Alcohol abuse Father   . Cancer Maternal Aunt     breast    No current facility-administered medications for this encounter.     Allergies  Allergen Reactions  . Adhesive (Tape) Itching  . Other     Betadine=blisters skin  ,Cashew Nuts,sunflower seeds-swelling and itching  . Penicillins Hives  . Benadryl (Diphenhydramine Hcl) Rash    BP 130/87  Pulse 71  Temp 98.6 F (37 C) (Oral)  Resp 18  SpO2 98%  Review of Systems  Constitutional: Negative for fever, chills, diaphoresis, appetite change and fatigue.  HENT: Negative for ear pain, sore throat, trouble swallowing, neck pain and ear discharge.  Eyes: Negative for photophobia, discharge and visual  disturbance.  Respiratory: Negative for cough, choking, chest tightness and shortness of breath.  Cardiovascular: Negative for chest pain and palpitations.  Gastrointestinal: Negative for nausea, vomiting, abdominal pain, diarrhea, constipation, anal bleeding and rectal pain.  Genitourinary: Negative for dysuria, frequency and difficulty urinating.  Musculoskeletal: Negative for myalgias and gait problem.  Skin: Negative for color change, pallor and rash.  Neurological: Negative for dizziness, speech difficulty, weakness and numbness.  Hematological: Negative for  adenopathy.  Psychiatric/Behavioral: Negative for confusion and agitation. The patient is not nervous/anxious.  Objective:   Physical Exam  Constitutional: She is oriented to person, place, and time. She appears well-developed and well-nourished. No distress.  HENT:  Head: Normocephalic.  Mouth/Throat: Oropharynx is clear and moist. No oropharyngeal exudate.  Eyes: Conjunctivae and EOM are normal. Pupils are equal, round, and reactive to light. No scleral icterus.  Neck: Normal range of motion. Neck supple. No tracheal deviation present.  Cardiovascular: Normal rate, regular rhythm and intact distal pulses.  Pulmonary/Chest: Effort normal and breath sounds normal. No respiratory distress. She exhibits no tenderness.  Abdominal: Soft. She exhibits no distension and no mass. There is tenderness in the epigastric area. There is no CVA tenderness and negative Murphy's sign. No hernia. Hernia confirmed negative in the right inguinal area and confirmed negative in the left inguinal area.  Obese  Genitourinary: No vaginal discharge found.  Musculoskeletal: Normal range of motion. She exhibits no tenderness.  Lymphadenopathy:  She has no cervical adenopathy.  Right: No inguinal adenopathy present.  Left: No inguinal adenopathy present.  Neurological: She is alert and oriented to person, place, and time. No cranial nerve deficit. She exhibits normal muscle tone. Coordination normal.  Skin: Skin is warm and dry. No rash noted. She is not diaphoretic. No erythema.  Psychiatric: She has a normal mood and affect. Her behavior is normal. Judgment and thought content normal.   Assessment:   Elevated liver function tests.  Gallstones  Mild heartburn  I suspect she has biliary colic on top of heartburn issues. Possible fatty change in liver.    Plan:   Because she's had elevated liver function tests, right-sided swelling bloating pain, and persistent symptoms even while on omeprazole; I think the  gallbladder is at least part of the problem.  Ultrasound showed GS & liver not well seen.  F/u U/s not done as initially scheduled. I would have a low threshold to consider a Tru-Cut core liver biopsy at the time of surgery if it looks abnormal.   I think she would benefit cholecystectomy. Very seen a gastroenterologist. I noted if she wished to have upper endoscopy in a more aggressive GI evaluation, that is reasonable. However she does not give me any other symptoms such as cardiopulmonary issues her gastroparesis. No history of hepatitis or every alcohol use. No abnormal medicines. She is leaning towards proceeding with surgery.   The anatomy & physiology of hepatobiliary & pancreatic function was discussed. The pathophysiology of gallbladder dysfunction was discussed. Natural history risks without surgery was discussed. I feel the risks of no intervention will lead to serious problems that outweigh the operative risks; therefore, I recommended cholecystectomy to remove the pathology. I explained laparoscopic techniques with possible need for an open approach. Probable cholangiogram to evaluate the bilary tract was explained as well.   Risks such as bleeding, infection, abscess, leak, injury to other organs, need for further treatment, heart attack, death, and other risks were discussed. I noted a good likelihood this will help address the  problem. Possibility that this will not correct all abdominal symptoms was explained. Goals of post-operative recovery were discussed as well. We will work to minimize complications. An educational handout further explaining the pathology and treatment options was given as well. Questions were answered. The patient expresses understanding & wishes to proceed with surgery.  I have re-reviewed the the patient's records, history, medications, and allergies.  I have re-examined the patient.  I again discussed intraoperative plans and goals of post-operative recovery.  The  patient agrees to proceed.   Ardeth Sportsman, M.D., F.A.C.S. Gastrointestinal and Minimally Invasive Surgery [facility] [address], [address] [place], [place] [address] [phone] Main / Paging [phone] Voice Mail   Results:   Labs: No results found for this or any previous visit (from the past 48 hour(s)).  Imaging / Studies: Dg Chest 2 View  07/18/2011  *RADIOLOGY REPORT*  Clinical Data: Preop for laparoscopic cholecystectomy  CHEST - 2 VIEW  Comparison: None.  Findings: The lungs are clear.  Mediastinal contours appear normal. The heart is within normal limits in size.  No bony abnormality is seen.  IMPRESSION: No active lung disease.  Original Report Authenticated By: Juline Patch, M.D.    Medications / Allergies: per chart  Antibiotics: Anti-infectives    None          07/26/2011

## 2011-07-26 NOTE — Op Note (Signed)
07/26/2011  9:01 AM  PATIENT:  Taylor Hurst  46 y.o. female  Patient Care Team: Lavell Islam, MD as PCP - General (Internal Medicine) Theda Belfast, MD as Consulting Physician (Gastroenterology)  PRE-OPERATIVE DIAGNOSIS:  Galltones, biliary colic, transaminitis  POST-OPERATIVE DIAGNOSIS:  Galltones, biliary colic, transaminitis,,probable steatohepatitis  PROCEDURE:  Procedure(s): LAPAROSCOPIC CHOLECYSTECTOMY SINGLE PORT INTRAOPERATIVE CHOLANGIOGRAM LIVER BIOPSY Core 14Gauge  SURGEON:  Surgeon(s): Ardeth Sportsman, MD  ASSISTANT: none   ANESTHESIA:   local and general  EBL:  Total I/O In: 1000 [I.V.:1000] Out: -   Delay start of Pharmacological VTE agent (>24hrs) due to surgical blood loss or risk of bleeding:  no  DRAINS: none   SPECIMEN:  Source of Specimen:  1.  Gallbladder  2. Liver biopsies (14Gauge core of R hepatic lobe x3)  DISPOSITION OF SPECIMEN:  PATHOLOGY  COUNTS:  YES  PLAN OF CARE: Discharge to home after PACU  PATIENT DISPOSITION:  PACU - hemodynamically stable.  INDICATION:   Pleasant obese female with intermittent abdominal pains and elevated liver function tests.  Ultrasound showed gallstone.  Reflux less likely.  Rest of differential diagnosis less likely.  I recommended cholecystectomy with possible liver biopsy if clamped gram looks normal:  The anatomy & physiology of hepatobiliary & pancreatic function was discussed.  The pathophysiology of gallbladder dysfunction was discussed.  Natural history risks without surgery was discussed.   I feel the risks of no intervention will lead to serious problems that outweigh the operative risks; therefore, I recommended cholecystectomy to remove the pathology.  I explained laparoscopic techniques with possible need for an open approach.  Probable cholangiogram to evaluate the bilary tract was explained as well.  Possible need for core liver biopsies with increased risk of bleeding discussed as well  Risks  such as bleeding, infection, abscess, leak, injury to other organs, need for further treatment, heart attack, death, and other risks were discussed.  I noted a good likelihood this will help address the problem.  Possibility that this will not correct all abdominal symptoms was explained.  Goals of post-operative recovery were discussed as well.  We will work to minimize complications.  An educational handout further explaining the pathology and treatment options was given as well.  Questions were answered.  The patient expresses understanding & wishes to proceed with surgery.  OR FINDINGS: She had some omental adhesions to the gallbladder consistent with chronic cholecystitis.  She had a solitary rectangular stone in the gallbladder.  Her cholangiogram looked rather normal.  She had some probable fatty change of liver but no frank cirrhosis.  DESCRIPTION:   The patient was identified & brought in the operating room. The patient was positioned supine with arms tucked. SCDs were active during the entire case. The patient underwent general anesthesia without any difficulty.  The abdomen was prepped and draped in a sterile fashion. A Surgical Timeout confirmed our plan.  I made a transverse curvilinear incision through the superior umbilical fold.  I placed a 5mm long port through the supraumbilical fascia using a modified Hassan cutdown technique. I began carbon dioxide insufflation. Camera inspection revealed no injury. There were no adhesions to the anterior abdominal wall supraumbilically.  I proceeded to continue with single site technique. I placed a #5 port in left upper aspect of the wound. I placed a 5 mm atraumatic grasper in the right inferior aspect of the wound.  I turned attention to the right upper quadrant.  There were moderate omental effusions to the  gallbladder on its anterior surface.  I skeletonized and removed then using focused blunt and ultrasonic dissection  The gallbladder fundus  was elevated cephalad. I freed the peritoneal coverings between the gallbladder and the liver on the posteriolateral and anteriomedial walls. I alternated between Harmonic & blunt Maryland dissection to help get a good critical view of the cystic artery and cystic duct. I did further dissection to free a few centimeters of the  gallbladder off the liver bed to get a good critical view of the infundibulum and cystic duct. I mobilized the cystic artery; and, after getting a good 360 view, ligated the cystic artery using the Harmonic ultrasonic dissection. I skeletonized the cystic duct.  I placed a clip on the infundibulum. I did a partial cystic duct-otomy and ensured patency. I placed a 5 [place] cholangiocatheter through a puncture site at the right subcostal ridge of the abdominal wall and directed it into the cystic duct.  I could not get it past the first pass.  It was near a valve.  I dilated it.  Still did not pass.  Therefore I repeated the cystic ductotomy a centimeter more towards the common bile duct.  The catheter threaded more easily there.  We ran a cholangiogram with dilute radio-opaque contrast and continuous fluoroscopy.  Contrast flowed from a side branch consistent with cystic duct cannulization. Contrast flowed up the common hepatic duct into the right and left intrahepatic chains out to secondary radicals. Contrast flowed down the common bile duct easily across the normal ampulla into the duodenum.  This was consistent with a normal cholangiogram.  I removed the cholangiocatheter. I placed clips on the cystic duct x4.   I completed cystic duct transection. I freed the gallbladder from its remaining attachments to the liver. I ensured hemostasis on the gallbladder fossa of the liver and elsewhere. I inspected the rest of the abdomen & detected no injury nor bleeding elsewhere.  Because she had increased liver function tests and only a solitary stone and normal cholangiogram, I proceeded  with liver biopsy.  I used a 14-gauge core needle through the right subcostal ridge onto the anterior surface of the right hepatic lobe.  I did five passes to get three decent cores.  I ensured hemostasis using spatula-tipped cautery.  Hemostasis was excellent.  I removed the gallbladder out the supraumbilical fascia. I closed the fascia transversely using 0 Vicryl interrupted stitches. A closed the skin using 4-0 monocryl stitch.  Sterile dressing was applied. The patient was extubated & arrived in the PACU in stable condition..  I had discussed postoperative care with the patient in the holding area. I am about to locate the patient's family and discuss operative findings and postoperative goals / instructions.  Instructions are written in the chart as well.

## 2011-07-26 NOTE — Transfer of Care (Signed)
Immediate Anesthesia Transfer of Care Note  Patient: Kanijah Alcala  Procedure(s) Performed: Procedure(s) (LRB): LAPAROSCOPIC CHOLECYSTECTOMY SINGLE PORT (N/A) INTRAOPERATIVE CHOLANGIOGRAM (N/A) LIVER BIOPSY (N/A)  Patient Location: PACU  Anesthesia Type: General  Level of Consciousness: awake, oriented and patient cooperative  Airway & Oxygen Therapy: Patient Spontanous Breathing and Patient connected to face mask oxygen  Post-op Assessment: Report given to PACU RN, Post -op Vital signs reviewed and stable and Patient moving all extremities X 4  Post vital signs: stable  Complications: No apparent anesthesia complications

## 2011-07-27 ENCOUNTER — Encounter (HOSPITAL_COMMUNITY): Payer: Self-pay | Admitting: Surgery

## 2011-08-05 ENCOUNTER — Encounter (HOSPITAL_COMMUNITY): Payer: Self-pay | Admitting: Emergency Medicine

## 2011-08-05 ENCOUNTER — Emergency Department (HOSPITAL_COMMUNITY)
Admission: EM | Admit: 2011-08-05 | Discharge: 2011-08-05 | Disposition: A | Discharge status: Home / Self Care | Payer: Managed Care, Other (non HMO) | Attending: Emergency Medicine | Admitting: Emergency Medicine

## 2011-08-05 ENCOUNTER — Emergency Department (HOSPITAL_COMMUNITY): Payer: Managed Care, Other (non HMO)

## 2011-08-05 DIAGNOSIS — Z79899 Other long term (current) drug therapy: Secondary | ICD-10-CM | POA: Insufficient documentation

## 2011-08-05 DIAGNOSIS — R079 Chest pain, unspecified: Secondary | ICD-10-CM | POA: Insufficient documentation

## 2011-08-05 DIAGNOSIS — R0602 Shortness of breath: Secondary | ICD-10-CM | POA: Insufficient documentation

## 2011-08-05 DIAGNOSIS — J9801 Acute bronchospasm: Secondary | ICD-10-CM | POA: Insufficient documentation

## 2011-08-05 DIAGNOSIS — I10 Essential (primary) hypertension: Secondary | ICD-10-CM | POA: Insufficient documentation

## 2011-08-05 LAB — CBC WITH DIFFERENTIAL/PLATELET
Eosinophils Absolute: 0.3 10*3/uL (ref 0.0–0.7)
Eosinophils Relative: 3 % (ref 0–5)
Hemoglobin: 11.9 g/dL — ABNORMAL LOW (ref 12.0–15.0)
Lymphs Abs: 2.4 10*3/uL (ref 0.7–4.0)
MCH: 29.9 pg (ref 26.0–34.0)
MCV: 89.9 fL (ref 78.0–100.0)
Monocytes Relative: 7 % (ref 3–12)
RBC: 3.98 MIL/uL (ref 3.87–5.11)

## 2011-08-05 LAB — BASIC METABOLIC PANEL
BUN: 13 mg/dL (ref 6–23)
CO2: 27 mEq/L (ref 19–32)
Calcium: 9.1 mg/dL (ref 8.4–10.5)
GFR calc non Af Amer: 85 mL/min — ABNORMAL LOW (ref >90)
Glucose, Bld: 91 mg/dL (ref 70–99)
Potassium: 3.5 mEq/L (ref 3.5–5.1)

## 2011-08-05 LAB — URINALYSIS, ROUTINE W REFLEX MICROSCOPIC
Bilirubin Urine: NEGATIVE
Glucose, UA: NEGATIVE mg/dL
Hgb urine dipstick: NEGATIVE
Specific Gravity, Urine: 1.015 (ref 1.005–1.030)
Urobilinogen, UA: 0.2 mg/dL (ref 0.0–1.0)

## 2011-08-05 LAB — URINE MICROSCOPIC-ADD ON

## 2011-08-05 MED ORDER — ASPIRIN 81 MG PO CHEW
162.0000 mg | CHEWABLE_TABLET | Freq: Once | ORAL | Status: AC
  Administered 2011-08-05: 162 mg via ORAL
  Filled 2011-08-05: qty 2

## 2011-08-05 NOTE — ED Notes (Signed)
Pt alert, nad, c/o sob, onset was this am, pt states awoke feeling this way, resp even unlabored, shallow, denies chest pain

## 2011-08-05 NOTE — ED Notes (Signed)
Pt c/o SOB that began this morning. Pt states she feels hot and has "pressure" in her throat "but it's not swollen". Pt denies cough, chest pain, headache. Pt states the cold air from freezer relieved symptoms temporarily.

## 2011-08-05 NOTE — ED Provider Notes (Signed)
History     CSN: [id number]  Arrival date & time 08/05/11  0865   First MD Initiated Contact with Patient 08/05/11 0715      Chief Complaint  Patient presents with  . Shortness of Breath    (Consider location/radiation/quality/duration/timing/severity/associated sxs/prior treatment) HPI Comments: Patient with no medical hx comes in with cc of SOB.  Pt reports that she woke up in the middle of the night as she felt SOB. She had no chest pain - just felt like her throat was being pushed on. She had no cough, wheezing, stridulous breath sounds. Her symptoms improved upon gettig nin the car and having "AC blasted." She has no hx of CAD, no hx of allergic reactions.  Patient is a 46 y.o. female presenting with shortness of breath. The history is provided by the patient.  Shortness of Breath  Associated symptoms include shortness of breath. Pertinent negatives include no chest pain.    Past Medical History  Diagnosis Date  . Nausea   . Hypertension   . GERD (gastroesophageal reflux disease)   . Arthritis     Past Surgical History  Procedure Date  . Osteomilitis     right and left leg/surgery; right 1983; left 1988  . Osteomylitis [id number]    right leg, left leg  . Intraoperative cholangiogram 07/26/2011    Procedure: INTRAOPERATIVE CHOLANGIOGRAM;  Surgeon: Ardeth Sportsman, MD;  Location: [facility];  Service: General;  Laterality: N/A;  . Liver biopsy 07/26/2011    Procedure: LIVER BIOPSY;  Surgeon: Ardeth Sportsman, MD;  Location: [facility];  Service: General;  Laterality: N/A;  core liver biopsy     Family History  Problem Relation Age of Onset  . COPD Mother   . Hypertension Mother   . Hyperlipidemia Mother   . Alcohol abuse Father   . Cancer Maternal Aunt     breast    History  Substance Use Topics  . Smoking status: Never Smoker   . Smokeless tobacco: Not on file  . Alcohol Use: No    OB History    Grav Para Term Preterm Abortions TAB SAB Ect Mult Living         Review of Systems  Constitutional: Positive for activity change.  HENT: Negative for neck pain.   Respiratory: Positive for choking and shortness of breath. Negative for chest tightness.   Cardiovascular: Negative for chest pain.  Gastrointestinal: Negative for nausea, vomiting and abdominal pain.  Genitourinary: Negative for dysuria.  Neurological: Negative for headaches.    Allergies  Adhesive; Other; Penicillins; and Benadryl  Home Medications   Current Outpatient Rx  Name Route Sig Dispense Refill  . AMLODIPINE BESYLATE 10 MG PO TABS Oral Take 10 mg by mouth daily with breakfast.     . B COMPLEX VITAMINS SL LIQD Sublingual Place 1 drop under the tongue daily with breakfast.    . BLACK COHOSH 540 MG PO CAPS Oral Take 540 mg by mouth daily.     Marland Kitchen MELATONIN 5 MG PO CAPS Oral Take 10 mg by mouth at bedtime. Takes 2 at bedtime    . MULTI-VITAMIN DAILY PO Oral Take 1 tablet by mouth daily with breakfast. Women's    . OMEPRAZOLE 20 MG PO CPDR Oral Take 20 mg by mouth 2 (two) times daily.     Marland Kitchen OVER THE COUNTER MEDICATION Oral Take 1 capsule by mouth 2 (two) times daily. Krill,fish oil, flax seed oil, borage=all in one capsule    .  OXYCODONE HCL 5 MG PO TABS Oral Take 1-2 tablets (5-10 mg total) by mouth every 4 (four) hours as needed for pain. 50 tablet 0  . VALSARTAN-HYDROCHLOROTHIAZIDE 320-25 MG PO TABS Oral Take 1 tablet by mouth daily with breakfast.     . LEVONORGESTREL 20 MCG/24HR IU IUD Intrauterine 1 each by Intrauterine route once. 20 mcg/24 hr IUD      BP 118/71  Pulse 62  Temp 98.3 F (36.8 C) (Oral)  Resp 16  SpO2 98%  LMP 04/17/2009  Physical Exam  Constitutional: She is oriented to person, place, and time. She appears well-developed and well-nourished.  HENT:  Head: Normocephalic and atraumatic.  Mouth/Throat: Oropharynx is clear and moist.  Eyes: EOM are normal. Pupils are equal, round, and reactive to light.  Neck: Neck supple.  Cardiovascular:  Normal rate, regular rhythm and normal heart sounds.   No murmur heard. Pulmonary/Chest: Effort normal. No respiratory distress.  Abdominal: Soft. She exhibits no distension. There is no tenderness. There is no rebound and no guarding.  Neurological: She is alert and oriented to person, place, and time.  Skin: Skin is warm and dry.    ED Course  Procedures (including critical care time)  Labs Reviewed  CBC WITH DIFFERENTIAL - Abnormal; Notable for the following:    Hemoglobin 11.9 (*)     HCT 35.8 (*)     All other components within normal limits  BASIC METABOLIC PANEL  URINALYSIS, ROUTINE W REFLEX MICROSCOPIC  TROPONIN I   Dg Chest 2 View  08/05/2011  *RADIOLOGY REPORT*  Clinical Data: Chest pain, shortness of breath  CHEST - 2 VIEW  Comparison:  07/18/2011  Findings:  The heart size and mediastinal contours are within normal limits.  Both lungs are clear.  The visualized skeletal structures are unremarkable.  IMPRESSION: No active cardiopulmonary disease.  Original Report Authenticated By: Judie Petit. Ruel Favors, M.D.     No diagnosis found.    MDM   Date: 08/05/2011  Rate: 68  Rhythm: normal sinus rhythm  QRS Axis: normal  Intervals: normal  ST/T Wave abnormalities: nonspecific T wave changes  Conduction Disutrbances:none  Narrative Interpretation:   Old EKG Reviewed: changes noted  Patient comes in with cc of sob. This appears to be brochospasm like SOB, or upper airway narrowing. Exam is benign. No chest pains at any point, and she has no cardiac risk factors. Pt has no SOB at this time, and applying Wells and PERC's criteria, we have no concerns for PE. We will get the basic workup, and discharge if she continues to feel well.      9:53 AM No SOB, vitals still WNL with no hypoxia. Will discharge. Will NOT Ttreat as an allergic reaction, as no evidence of that seen in the ED.  Derwood Kaplan, MD 08/07/11 [phone]

## 2011-08-14 ENCOUNTER — Encounter (INDEPENDENT_AMBULATORY_CARE_PROVIDER_SITE_OTHER): Payer: Self-pay

## 2011-08-15 ENCOUNTER — Other Ambulatory Visit (HOSPITAL_COMMUNITY): Payer: Self-pay | Admitting: Family Medicine

## 2011-08-15 DIAGNOSIS — Z1231 Encounter for screening mammogram for malignant neoplasm of breast: Secondary | ICD-10-CM

## 2011-08-20 ENCOUNTER — Encounter (INDEPENDENT_AMBULATORY_CARE_PROVIDER_SITE_OTHER): Payer: Managed Care, Other (non HMO) | Admitting: Surgery

## 2011-08-29 ENCOUNTER — Ambulatory Visit (INDEPENDENT_AMBULATORY_CARE_PROVIDER_SITE_OTHER): Payer: Managed Care, Other (non HMO) | Admitting: Surgery

## 2011-08-29 ENCOUNTER — Encounter (INDEPENDENT_AMBULATORY_CARE_PROVIDER_SITE_OTHER): Payer: Self-pay | Admitting: Surgery

## 2011-08-29 VITALS — BP 118/70 | HR 84 | Temp 97.6°F | Ht 62.0 in | Wt 209.4 lb

## 2011-08-29 DIAGNOSIS — K732 Chronic active hepatitis, not elsewhere classified: Secondary | ICD-10-CM

## 2011-08-29 DIAGNOSIS — K811 Chronic cholecystitis: Secondary | ICD-10-CM

## 2011-08-29 DIAGNOSIS — K5900 Constipation, unspecified: Secondary | ICD-10-CM

## 2011-08-29 DIAGNOSIS — K738 Other chronic hepatitis, not elsewhere classified: Secondary | ICD-10-CM

## 2011-08-29 DIAGNOSIS — K5909 Other constipation: Secondary | ICD-10-CM | POA: Insufficient documentation

## 2011-08-29 NOTE — Progress Notes (Signed)
Subjective:     Patient ID: Taylor Hurst, female   DOB: Apr 11, 1965, 46 y.o.   MRN: [id number]  HPI  Taylor Hurst  [birth year] [id number]  Patient Care Team: Laruth Bouchard as PCP - General (Family Medicine) Theda Belfast, MD as Consulting Physician (Gastroenterology)  This patient is a 46 y.o.female who presents today for surgical evaluation.   Procedure: Single site cholecystectomy and liver biopsy 07/26/2011  The patient comes in today feeling better.  No more abdominal pain.  No nausea or vomiting.  Energy level improving.  Back to work.  Struggles with constipation.  Eating more than 30 g of fiber a day.  Trying to use milk of magnesia occasionally as well.  Workup for hepatitis viral studies negative so far  Patient Active Problem List  Diagnosis  . HTN (hypertension)  . Insomnia  . Obesity (BMI 30-39.9)  . Chronic active hepatitis by liver biopsy [id number]  . Chronic heartburn  . Chronic constipation    Past Medical History  Diagnosis Date  . Nausea   . Hypertension   . GERD (gastroesophageal reflux disease)   . Arthritis   . Chronic cholecystitis 05/30/2011    Past Surgical History  Procedure Date  . Osteomilitis     right and left leg/surgery; right 1983; left 1988  . Osteomylitis 1308,6578    right leg, left leg  . Intraoperative cholangiogram 07/26/2011    Procedure: INTRAOPERATIVE CHOLANGIOGRAM;  Surgeon: Ardeth Sportsman, MD;  Location: [facility];  Service: General;  Laterality: N/A;  . Liver biopsy 07/26/2011    Procedure: LIVER BIOPSY;  Surgeon: Ardeth Sportsman, MD;  Location: [facility];  Service: General;  Laterality: N/A;  core liver biopsy   . Cholecystectomy 07/26/11    History   Social History  . Marital Status: Married    Spouse Name: N/A    Number of Children: N/A  . Years of Education: N/A   Occupational History  . Not on file.   Social History Main Topics  . Smoking status: Never Smoker   . Smokeless tobacco: Not on file  . Alcohol Use: No  .  Drug Use: No  . Sexually Active: Yes    Birth Control/ Protection: IUD   Other Topics Concern  . Not on file   Social History Narrative  . No narrative on file    Family History  Problem Relation Age of Onset  . COPD Mother   . Hypertension Mother   . Hyperlipidemia Mother   . Alcohol abuse Father   . Cancer Maternal Aunt     breast    Current Outpatient Prescriptions  Medication Sig Dispense Refill  . amLODipine (NORVASC) 10 MG tablet Take 10 mg by mouth daily with breakfast.       . B Complex Vitamins LIQD Place 1 drop under the tongue daily with breakfast.      . Black Cohosh 540 MG CAPS Take 540 mg by mouth daily.       Marland Kitchen levonorgestrel (MIRENA) 20 MCG/24HR IUD 1 each by Intrauterine route once. 20 mcg/24 hr IUD      . Melatonin 5 MG CAPS Take 10 mg by mouth at bedtime. Takes 2 at bedtime      . Multiple Vitamin (MULTI-VITAMIN DAILY PO) Take 1 tablet by mouth daily with breakfast. Women's      . omeprazole (PRILOSEC) 20 MG capsule Take 20 mg by mouth 2 (two) times daily.       Marland Kitchen OVER THE COUNTER  MEDICATION Take 1 capsule by mouth 2 (two) times daily. Krill,fish oil, flax seed oil, borage=all in one capsule      . valsartan-hydrochlorothiazide (DIOVAN-HCT) 320-25 MG per tablet Take 1 tablet by mouth daily with breakfast.          Allergies  Allergen Reactions  . Adhesive (Tape) Itching  . Other     Betadine=blisters skin  ,Cashew Nuts,sunflower seeds-swelling and itching  . Penicillins Hives  . Benadryl (Diphenhydramine Hcl) Rash    BP 118/70  Pulse 84  Temp 97.6 F (36.4 C) (Temporal)  Ht 5\' 2"  (1.575 m)  Wt 209 lb 6.4 oz (94.983 kg)  BMI 38.30 kg/m2  SpO2 96%  Dg Chest 2 View  08/05/2011  *RADIOLOGY REPORT*  Clinical Data: Chest pain, shortness of breath  CHEST - 2 VIEW  Comparison:  07/18/2011  Findings:  The heart size and mediastinal contours are within normal limits.  Both lungs are clear.  The visualized skeletal structures are unremarkable.  IMPRESSION:  No active cardiopulmonary disease.  Original Report Authenticated By: Judie Petit. Ruel Favors, M.D.     Review of Systems  Constitutional: Negative for fever, chills and diaphoresis.  HENT: Negative for ear pain, sore throat and trouble swallowing.   Eyes: Negative for photophobia and visual disturbance.  Respiratory: Negative for cough and choking.   Cardiovascular: Negative for chest pain and palpitations.  Gastrointestinal: Positive for constipation. Negative for nausea, vomiting, abdominal pain, diarrhea, blood in stool, abdominal distention, anal bleeding and rectal pain.  Genitourinary: Negative for dysuria, frequency and difficulty urinating.  Musculoskeletal: Negative for myalgias and gait problem.  Skin: Negative for color change, pallor and rash.  Neurological: Negative for dizziness, speech difficulty, weakness and numbness.  Hematological: Negative for adenopathy.  Psychiatric/Behavioral: Negative for confusion and agitation. The patient is not nervous/anxious.        Objective:   Physical Exam  Constitutional: She is oriented to person, place, and time. She appears well-developed and well-nourished. No distress.  HENT:  Head: Normocephalic.  Mouth/Throat: Oropharynx is clear and moist. No oropharyngeal exudate.  Eyes: Conjunctivae and EOM are normal. Pupils are equal, round, and reactive to light. No scleral icterus.  Neck: Normal range of motion. No tracheal deviation present.  Cardiovascular: Normal rate and intact distal pulses.   Pulmonary/Chest: Effort normal. No respiratory distress. She exhibits no tenderness.  Abdominal: Soft. She exhibits no distension. There is no tenderness. Hernia confirmed negative in the right inguinal area and confirmed negative in the left inguinal area.       Incisions clean with normal healing ridges.  No hernias  Genitourinary: No vaginal discharge found.  Musculoskeletal: Normal range of motion. She exhibits no tenderness.  Lymphadenopathy:         Right: No inguinal adenopathy present.       Left: No inguinal adenopathy present.  Neurological: She is alert and oriented to person, place, and time. No cranial nerve deficit. She exhibits normal muscle tone. Coordination normal.  Skin: Skin is warm and dry. No rash noted. She is not diaphoretic.  Psychiatric: She has a normal mood and affect. Her behavior is normal.       Assessment:     Chronic cholecystitis with hepatitis on liver biopsy    Plan:     Increase activity as tolerated.  Do not push through pain.  Advanced on diet as tolerated. Bowel regimen to avoid problems.  Consider trying MiraLAX to more aggressively deal with constipation.  Continue workup of hepatitis  per Dr. Elnoria Howard.  I agree is most likely medication-related since viral is negative.  Does not seem to severe.  Return to clinic p.r.n. The patient expressed understanding and appreciation

## 2011-08-29 NOTE — Patient Instructions (Addendum)

## 2011-10-31 ENCOUNTER — Ambulatory Visit (HOSPITAL_COMMUNITY)
Admission: RE | Admit: 2011-10-31 | Discharge: 2011-10-31 | Disposition: A | Discharge status: Home / Self Care | Payer: Managed Care, Other (non HMO) | Source: Ambulatory Visit | Attending: Family Medicine | Admitting: Family Medicine

## 2011-10-31 DIAGNOSIS — Z1231 Encounter for screening mammogram for malignant neoplasm of breast: Secondary | ICD-10-CM | POA: Insufficient documentation

## 2012-02-15 ENCOUNTER — Ambulatory Visit: Payer: Self-pay | Admitting: Family Medicine

## 2012-08-20 ENCOUNTER — Ambulatory Visit: Payer: Self-pay | Admitting: Family Medicine

## 2013-07-24 ENCOUNTER — Other Ambulatory Visit (HOSPITAL_COMMUNITY): Payer: Self-pay | Admitting: Family Medicine

## 2013-07-24 DIAGNOSIS — Z1231 Encounter for screening mammogram for malignant neoplasm of breast: Secondary | ICD-10-CM

## 2013-08-27 ENCOUNTER — Ambulatory Visit (HOSPITAL_COMMUNITY)
Admission: RE | Admit: 2013-08-27 | Discharge: 2013-08-27 | Disposition: A | Discharge status: Home / Self Care | Payer: Managed Care, Other (non HMO) | Source: Ambulatory Visit | Attending: Family Medicine | Admitting: Family Medicine

## 2013-08-27 DIAGNOSIS — Z1231 Encounter for screening mammogram for malignant neoplasm of breast: Secondary | ICD-10-CM | POA: Diagnosis present

## 2014-04-29 ENCOUNTER — Encounter: Payer: Self-pay | Admitting: Podiatry

## 2014-04-29 ENCOUNTER — Ambulatory Visit (INDEPENDENT_AMBULATORY_CARE_PROVIDER_SITE_OTHER): Payer: Managed Care, Other (non HMO)

## 2014-04-29 ENCOUNTER — Ambulatory Visit (INDEPENDENT_AMBULATORY_CARE_PROVIDER_SITE_OTHER): Payer: Managed Care, Other (non HMO) | Admitting: Podiatry

## 2014-04-29 VITALS — BP 131/76 | HR 78 | Resp 16 | Ht 61.0 in | Wt 217.0 lb

## 2014-04-29 DIAGNOSIS — L600 Ingrowing nail: Secondary | ICD-10-CM | POA: Diagnosis not present

## 2014-04-29 DIAGNOSIS — M779 Enthesopathy, unspecified: Secondary | ICD-10-CM | POA: Diagnosis not present

## 2014-04-29 NOTE — Progress Notes (Signed)
Subjective:     Patient ID: Taylor Hurst, female   DOB: October 21, 1965, 49 y.o.   MRN: [id number]  HPI Patient presents to the office today for complaints of a painful ingrown toenail on the left big toe. She states that she previously had injury to the toenail and since then nail grew back curving into the nail border which causes irritation with pressure in shoe gear. She said no prior treatment for this. She has a states of the back of her right ankle hasn't hurting for several months. She states of the areas painful particularly with certain shoes. She denies any history of injury or trauma to the area. Denies any swelling or redness all as she does feel there is some thickening of the tendon. Denies any numbness or tingling. The pain does not wake her up at night. No prior treatment. No other complaints at this time.   Review of Systems  All other systems reviewed and are negative.      Objective:   Physical Exam AAO x3, NAD DP/PT pulses palpable bilaterally, CRT less than 3 seconds Protective sensation intact with Simms Weinstein monofilament, vibratory sensation intact, Achilles tendon reflex intact Tenderness to palpation overlying the posterior aspect of the Achilles tendon along the mid substance of the Achilles tendon. There does appear to be mild thickening of the Achilles tendon. There is no defect noted. Janee Morn test was performed is negative. There is no overlying edema, erythema, increase in warmth. There is no pain with active or passive range of motion and plantar flexion dorsiflexion. No areas of pinpoint bony tenderness to bilateral lower extremities. MMT 5/5, ROM WNL.  There is aggravation on the medial aspect of the left hallux toenail. They're service palpation overlying the ingrowing portion of the nail. There is no overlying edema, erythema, drainage, or other conical signs of infection. The nail does appear to be hypertrophic, dystrophic, discolored. Remaining nails are also  discolored and slightly hypertrophic. There is no tenderness to palpation along the other nails there is no surrounding erythema or drainage.  No open lesions or pre-ulcerative lesions.  No overlying edema, erythema, increase in warmth to bilateral lower extremities.  No pain with calf compression, swelling, warmth, erythema bilaterally.      Assessment:     R achilles tendonitis; left medial hallux ingrown toenail without infection    Plan:     -Treatment options were discussed including all alternatives, risks, complications -X-rays were obtained of the right side and reviewed with the patient -In regards Achilles tendinitis on the right side discussed stretching/rehab exercises, anti-inflammatories as needed, heel lift, shoegear modifications. If symptoms continue will likely refer to PT -At this time, recommended partial nail removal with chemical matricectomy to the left medial hallux nail border. However, she does not want the chemical placed so her nail would not be smaller. I discussed with her the ingrown nail will likely reoccur. She understands. Risks and complications were discussed with the patient for which they understand and verbally consent to the procedure. Under sterile conditions a total of 3 mL of a mixture of 2% lidocaine plain and 0.5% Marcaine plain was infiltrated in a hallux block fashion. Once anesthetized, the skin was prepped in sterile fashion. A tourniquet was then applied. Next the medial border of the left hallux nail border was sharply excised making sure to remove the entire offending nail border. Once the nail was removed, the area was debrided and the underlying skin was intact. The area was irrigated  and hemostasis was obtained.  A dry sterile dressing was applied. After application of the dressing the tourniquet was removed and there is found to be an immediate capillary refill time to the digit. The patient tolerated the procedure well any complications. Post  procedure instructions were discussed the patient for which he verbally understood. Follow-up in one week for nail check or sooner if any problems are to arise. Discussed signs/symptoms of worsening infection and directed to call the office immediately should any occur or go directly to the emergency room. In the meantime, encouraged to call the office with any questions, concerns, changes symptoms.

## 2014-04-29 NOTE — Patient Instructions (Addendum)
Soak Instructions    THE DAY AFTER THE PROCEDURE  Place 1/4 cup of epsom salts in a quart of warm tap water.  Submerge your foot or feet with outer bandage intact for the initial soak; this will allow the bandage to become moist and wet for easy lift off.  Once you remove your bandage, continue to soak in the solution for 20 minutes.  This soak should be done twice a day.  Next, remove your foot or feet from solution, blot dry the affected area and cover.  You may use a band aid large enough to cover the area or use gauze and tape.  Apply other medications to the area as directed by the doctor such as polysporin neosporin.  IF YOUR SKIN BECOMES IRRITATED WHILE USING THESE INSTRUCTIONS, IT IS OKAY TO SWITCH TO  [employer] VINEGAR AND WATER. Or you may use antibacterial soap and water to keep the toe clean  Monitor for any signs/symptoms of infection. Call the office immediately if any occur or go directly to the emergency room. Call with any questions/concerns.    Achilles Tendinitis  with Rehab Achilles tendinitis is a disorder of the Achilles tendon. The Achilles tendon connects the large calf muscles (Gastrocnemius and Soleus) to the heel bone (calcaneus). This tendon is sometimes called the heel cord. It is important for pushing-off and standing on your toes and is important for walking, running, or jumping. Tendinitis is often caused by overuse and repetitive microtrauma. SYMPTOMS  Pain, tenderness, swelling, warmth, and redness may occur over the Achilles tendon even at rest.  Pain with pushing off, or flexing or extending the ankle.  Pain that is worsened after or during activity. CAUSES   Overuse sometimes seen with rapid increase in exercise programs or in sports requiring running and jumping.  Poor physical conditioning (strength and flexibility or endurance).  Running sports, especially training running down hills.  Inadequate warm-up before practice or play or failure to stretch  before participation.  Injury to the tendon. PREVENTION   Warm up and stretch before practice or competition.  Allow time for adequate rest and recovery between practices and competition.  Keep up conditioning.  Keep up ankle and leg flexibility.  Improve or keep muscle strength and endurance.  Improve cardiovascular fitness.  Use proper technique.  Use proper equipment (shoes, skates).  To help prevent recurrence, taping, protective strapping, or an adhesive bandage may be recommended for several weeks after healing is complete. PROGNOSIS   Recovery may take weeks to several months to heal.  Longer recovery is expected if symptoms have been prolonged.  Recovery is usually quicker if the inflammation is due to a direct blow as compared with overuse or sudden strain. RELATED COMPLICATIONS   Healing time will be prolonged if the condition is not correctly treated. The injury must be given plenty of time to heal.  Symptoms can reoccur if activity is resumed too soon.  Untreated, tendinitis may increase the risk of tendon rupture requiring additional time for recovery and possibly surgery. TREATMENT   The first treatment consists of rest anti-inflammatory medication, and ice to relieve the pain.  Stretching and strengthening exercises after resolution of pain will likely help reduce the risk of recurrence. Referral to a physical therapist or athletic trainer for further evaluation and treatment may be helpful.  A walking boot or cast may be recommended to rest the Achilles tendon. This can help break the cycle of inflammation and microtrauma.  Arch supports (orthotics) may be  prescribed or recommended by your caregiver as an adjunct to therapy and rest.  Surgery to remove the inflamed tendon lining or degenerated tendon tissue is rarely necessary and has shown less than predictable results. MEDICATION   Nonsteroidal anti-inflammatory medications, such as aspirin and  ibuprofen, may be used for pain and inflammation relief. Do not take within 7 days before surgery. Take these as directed by your caregiver. Contact your caregiver immediately if any bleeding, stomach upset, or signs of allergic reaction occur. Other minor pain relievers, such as acetaminophen, may also be used.  Pain relievers may be prescribed as necessary by your caregiver. Do not take prescription pain medication for longer than 4 to 7 days. Use only as directed and only as much as you need.  Cortisone injections are rarely indicated. Cortisone injections may weaken tendons and predispose to rupture. It is better to give the condition more time to heal than to use them. HEAT AND COLD  Cold is used to relieve pain and reduce inflammation for acute and chronic Achilles tendinitis. Cold should be applied for 10 to 15 minutes every 2 to 3 hours for inflammation and pain and immediately after any activity that aggravates your symptoms. Use ice packs or an ice massage.  Heat may be used before performing stretching and strengthening activities prescribed by your caregiver. Use a heat pack or a warm soak. SEEK MEDICAL CARE IF:  Symptoms get worse or do not improve in 2 weeks despite treatment.  New, unexplained symptoms develop. Drugs used in treatment may produce side effects. EXERCISES RANGE OF MOTION (ROM) AND STRETCHING EXERCISES - Achilles Tendinitis  These exercises may help you when beginning to rehabilitate your injury. Your symptoms may resolve with or without further involvement from your physician, physical therapist or athletic trainer. While completing these exercises, remember:   Restoring tissue flexibility helps normal motion to return to the joints. This allows healthier, less painful movement and activity.  An effective stretch should be held for at least 30 seconds.  A stretch should never be painful. You should only feel a gentle lengthening or release in the stretched  tissue. STRETCH - Gastroc, Standing   Place hands on wall.  Extend right / left leg, keeping the front knee somewhat bent.  Slightly point your toes inward on your back foot.  Keeping your right / left heel on the floor and your knee straight, shift your weight toward the wall, not allowing your back to arch.  You should feel a gentle stretch in the right / left calf. Hold this position for __________ seconds. Repeat __________ times. Complete this stretch __________ times per day. STRETCH - Soleus, Standing   Place hands on wall.  Extend right / left leg, keeping the other knee somewhat bent.  Slightly point your toes inward on your back foot.  Keep your right / left heel on the floor, bend your back knee, and slightly shift your weight over the back leg so that you feel a gentle stretch deep in your back calf.  Hold this position for __________ seconds. Repeat __________ times. Complete this stretch __________ times per day. STRETCH - Gastrocsoleus, Standing  Note: This exercise can place a lot of stress on your foot and ankle. Please complete this exercise only if specifically instructed by your caregiver.   Place the ball of your right / left foot on a step, keeping your other foot firmly on the same step.  Hold on to the wall or a rail for  balance.  Slowly lift your other foot, allowing your body weight to press your heel down over the edge of the step.  You should feel a stretch in your right / left calf.  Hold this position for __________ seconds.  Repeat this exercise with a slight bend in your knee. Repeat __________ times. Complete this stretch __________ times per day.  STRENGTHENING EXERCISES - Achilles Tendinitis These exercises may help you when beginning to rehabilitate your injury. They may resolve your symptoms with or without further involvement from your physician, physical therapist or athletic trainer. While completing these exercises, remember:    Muscles can gain both the endurance and the strength needed for everyday activities through controlled exercises.  Complete these exercises as instructed by your physician, physical therapist or athletic trainer. Progress the resistance and repetitions only as guided.  You may experience muscle soreness or fatigue, but the pain or discomfort you are trying to eliminate should never worsen during these exercises. If this pain does worsen, stop and make certain you are following the directions exactly. If the pain is still present after adjustments, discontinue the exercise until you can discuss the trouble with your clinician. STRENGTH - Plantar-flexors   Sit with your right / left leg extended. Holding onto both ends of a rubber exercise band/tubing, loop it around the ball of your foot. Keep a slight tension in the band.  Slowly push your toes away from you, pointing them downward.  Hold this position for __________ seconds. Return slowly, controlling the tension in the band/tubing. Repeat __________ times. Complete this exercise __________ times per day.  STRENGTH - Plantar-flexors   Stand with your feet shoulder width apart. Steady yourself with a wall or table using as little support as needed.  Keeping your weight evenly spread over the width of your feet, rise up on your toes.*  Hold this position for __________ seconds. Repeat __________ times. Complete this exercise __________ times per day.  *If this is too easy, shift your weight toward your right / left leg until you feel challenged. Ultimately, you may be asked to do this exercise with your right / left foot only. STRENGTH - Plantar-flexors, Eccentric  Note: This exercise can place a lot of stress on your foot and ankle. Please complete this exercise only if specifically instructed by your caregiver.   Place the balls of your feet on a step. With your hands, use only enough support from a wall or rail to keep your  balance.  Keep your knees straight and rise up on your toes.  Slowly shift your weight entirely to your right / left toes and pick up your opposite foot. Gently and with controlled movement, lower your weight through your right / left foot so that your heel drops below the level of the step. You will feel a slight stretch in the back of your calf at the end position.  Use the healthy leg to help rise up onto the balls of both feet, then lower weight only on the right / left leg again. Build up to 15 repetitions. Then progress to 3 consecutive sets of 15 repetitions.*  After completing the above exercise, complete the same exercise with a slight knee bend (about 30 degrees). Again, build up to 15 repetitions. Then progress to 3 consecutive sets of 15 repetitions.* Perform this exercise __________ times per day.  *When you easily complete 3 sets of 15, your physician, physical therapist or athletic trainer may advise you to add resistance  by wearing a backpack filled with additional weight. STRENGTH - Plantar Flexors, Seated   Sit on a chair that allows your feet to rest flat on the ground. If necessary, sit at the edge of the chair.  Keeping your toes firmly on the ground, lift your right / left heel as far as you can without increasing any discomfort in your ankle. Repeat __________ times. Complete this exercise __________ times a day. *If instructed by your physician, physical therapist or athletic trainer, you may add ____________________ of resistance by placing a weighted object on your right / left knee. Document Released: 08/02/2004 Document Revised: 03/26/2011 Document Reviewed: 04/15/2008 [facility] Patient Information 2015 [employer], [place]. This information is not intended to replace advice given to you by your health care provider. Make sure you discuss any questions you have with your health care provider.

## 2014-05-06 ENCOUNTER — Ambulatory Visit (INDEPENDENT_AMBULATORY_CARE_PROVIDER_SITE_OTHER): Payer: Managed Care, Other (non HMO) | Admitting: Podiatry

## 2014-05-06 DIAGNOSIS — L600 Ingrowing nail: Secondary | ICD-10-CM

## 2014-05-06 DIAGNOSIS — Z9889 Other specified postprocedural states: Secondary | ICD-10-CM

## 2014-05-06 DIAGNOSIS — M779 Enthesopathy, unspecified: Secondary | ICD-10-CM

## 2014-05-06 NOTE — Patient Instructions (Signed)

## 2014-05-09 NOTE — Progress Notes (Signed)
Patient ID: Taylor GibsonJustina F Hurst, female   DOB: 05/31/1965, 49 y.o.   MRN: [id number]  Subjective: 49 year old female presents the office today one-week status post left medial hallux partial nail avulsion with chemical my trajectory. She says overall she is doing well. She denies any pain to the area and denies any surrounding redness, drainage, swelling or other clinical signs of infection. She is continue soaking in Epson salts covering with antibiotic ointment and a Band-Aid. She's been continuing the stretching exercises on the right side and she states that the pain for the Achilles tendon is somewhat better. No other complaints at this time in no acute changes since last appointment.  Objective: AAO 3, NAD DP/PT pulses palpable, CRT less than 3 seconds Protective sensation intact with Taylor Hurst monofilament Status post left medial hallux partial nail avulsion which is healing well. There is no tenderness to palpation over the area and there is no swelling edema, erythema, drainage/purulence, increase in warmth. There continues to mild discomfort overlying the midsubstance of the Achilles tendon on the right foot with mild hypertrophy the tendon. Subjectively the pain has decreased. There is no defect noted in [place] was performed which was negative. No overlying edema, erythema, increase in warmth. There is no pain with range of motion. MMT 5/5, ROM WNL No other open lesions or pre-ulcerative lesions No pain with calf compression, swelling, warmth, erythema.   Assessment: 49 year old female 1 week status post left partial nail avulsion; right Achilles tendinitis  Plan: -Continue soaking in Epson salt soaks twice a day followed by antibody ointment and a Band-Aid. Can leave the area uncovered at night. Continue this to the area has completely healed. -Continue stretching exercises on the right side. Dispensed heel lifts. Continue meloxicam for which other physician already  prescribed. -Follow-up in 2 weeks or sooner any problems are to arise. Call any questions or concerns.

## 2014-05-27 ENCOUNTER — Ambulatory Visit: Payer: Managed Care, Other (non HMO) | Admitting: Podiatry

## 2014-06-03 ENCOUNTER — Encounter: Payer: Self-pay | Admitting: Podiatry

## 2014-06-03 ENCOUNTER — Ambulatory Visit (INDEPENDENT_AMBULATORY_CARE_PROVIDER_SITE_OTHER): Payer: Managed Care, Other (non HMO) | Admitting: Podiatry

## 2014-06-03 VITALS — Ht 61.0 in | Wt 216.0 lb

## 2014-06-03 DIAGNOSIS — B351 Tinea unguium: Secondary | ICD-10-CM

## 2014-06-03 DIAGNOSIS — M7661 Achilles tendinitis, right leg: Secondary | ICD-10-CM

## 2014-06-03 DIAGNOSIS — Z9889 Other specified postprocedural states: Secondary | ICD-10-CM

## 2014-06-03 MED ORDER — TAVABOROLE 5 % EX SOLN: 1.0000 [drp] | CUTANEOUS | Status: DC

## 2014-06-03 NOTE — Progress Notes (Signed)
Patient ID: Taylor GibsonJustina F Hurst, female   DOB: 09/26/1965, 49 y.o.   MRN: [id number]  Subjective: 49 year old female presents the office today for follow-up evaluation of right Achilles tendon pain and for left toenail follow-up. She states that she is a pain to her right Achilles tendon the pain is not improved much. She states that she is continuing stretching and icing and anti-inflammatories without much relief. She also states that the left hallux procedure site is doing well and she has no pain and the area and she denies any redness or swelling. She is inquiring possible toenail fungus treatment. Denies any systemic complaints such as fevers, chills, nausea, vomiting. No acute changes since last appointment, and no other complaints at this time.   Objective: AAO x3, NAD DP/PT pulses palpable bilaterally, CRT less than 3 seconds Protective sensation intact with Simms Weinstein monofilament, vibratory sensation intact, Achilles tendon reflex intact On the posterior aspect of the right ankle on the course of the Achilles tendon there is discomfort along the mid substance. There is an area of hypertrophic tissue within the mid substance of the Achilles tendon with tenderness overlying this area. There is no overlying edema, erythema, increase in warmth. There is no pinpoint bony tenderness or pain the vibratory sensation to the right lower extremity. On the left lower extremity there is no areas of edema, erythema, increase in warmth and no pain. There is evidence of a healed procedure site on the medial aspect left hallux toenail. In the left hallux nails hypertrophic, dystrophic and discolored. No edema, erythema, increase in warmth to bilateral lower extremities.  No open lesions or pre-ulcerative lesions.  No pain with calf compression, swelling, warmth, erythema  Assessment:  49 year old female with continued right Achilles tendinitis, left hallux healed procedure site and likely onychomycosis    Plan: -All treatment options discussed with the patient including all alternatives, risks, complications.  -At this time to the right Achilles tendinitis I recommended physical therapy. A prescription for this was given to the patient.  -Regards to the left hallux toenail on his are consistent with onychomycosis. Because of this prescribed Kerydin and a prescription was sent to [facility]. Application instructions as well as side effects the medication were discussed with the patient.  -Follow-up in 4 weeks or sooner if any problems are to arise. Call with questions or concerns in the meantime.

## 2014-07-30 ENCOUNTER — Ambulatory Visit (INDEPENDENT_AMBULATORY_CARE_PROVIDER_SITE_OTHER): Payer: Managed Care, Other (non HMO) | Admitting: Family Medicine

## 2014-07-30 ENCOUNTER — Encounter (INDEPENDENT_AMBULATORY_CARE_PROVIDER_SITE_OTHER): Payer: Self-pay

## 2014-07-30 ENCOUNTER — Encounter: Payer: Self-pay | Admitting: Family Medicine

## 2014-07-30 VITALS — BP 130/77 | HR 107 | Temp 97.5°F | Resp 18 | Ht 61.0 in | Wt 218.0 lb

## 2014-07-30 DIAGNOSIS — E7800 Pure hypercholesterolemia, unspecified: Secondary | ICD-10-CM | POA: Insufficient documentation

## 2014-07-30 DIAGNOSIS — M170 Bilateral primary osteoarthritis of knee: Secondary | ICD-10-CM | POA: Diagnosis not present

## 2014-07-30 DIAGNOSIS — I10 Essential (primary) hypertension: Secondary | ICD-10-CM

## 2014-07-30 MED ORDER — MELOXICAM 15 MG PO TABS: 15.0000 mg | ORAL_TABLET | Freq: Every day | ORAL | Status: DC

## 2014-07-30 NOTE — Progress Notes (Signed)
Name: Taylor Hurst   MRN: [id number]    DOB: 1966/01/01   Date:07/30/2014       Progress Note  Subjective  Chief Complaint  Chief Complaint  Patient presents with  . Follow-up    6 mo./patient wants to discuss fatigue    Hypertension This is a chronic problem. The problem is controlled. Pertinent negatives include no blurred vision, chest pain, headaches or palpitations. Past treatments include calcium channel blockers, diuretics and angiotensin blockers. The current treatment provides significant improvement. There is no history of angina, kidney disease, CAD/MI or CVA.  Hyperlipidemia This is a chronic problem. The problem is uncontrolled. Recent lipid tests were reviewed and are high. Exacerbating diseases include obesity. She has no history of diabetes or liver disease. Pertinent negatives include no chest pain or myalgias. Current antihyperlipidemic treatment includes herbal therapy and diet change.  Arthritis Presents for follow-up visit. The disease course has been stable. She complains of pain and stiffness. She reports no joint swelling. The symptoms have been stable. Affected locations include the right knee and left knee. Her pain is at a severity of 6/10. Pertinent negatives include no fever. Her pertinent risk factors include overuse. Past treatments include NSAIDs. The treatment provided significant relief. Factors aggravating her arthritis include activity. Compliance with prior treatments has been good.      Past Medical History  Diagnosis Date  . Nausea   . Hypertension   . GERD (gastroesophageal reflux disease)   . Arthritis   . Chronic cholecystitis 05/30/2011    Past Surgical History  Procedure Laterality Date  . Osteomilitis      right and left leg/surgery; right 1983; left 1988  . Osteomylitis  4098,1191    right leg, left leg  . Intraoperative cholangiogram  07/26/2011    Procedure: INTRAOPERATIVE CHOLANGIOGRAM;  Surgeon: Ardeth Sportsman, MD;  Location: [facility]  ORS;  Service: General;  Laterality: N/A;  . Liver biopsy  07/26/2011    Procedure: LIVER BIOPSY;  Surgeon: Ardeth Sportsman, MD;  Location: [facility]S;  Service: General;  Laterality: N/A;  core liver biopsy   . Cholecystectomy  07/26/11    Family History  Problem Relation Age of Onset  . COPD Mother   . Hypertension Mother   . Hyperlipidemia Mother   . Alcohol abuse Father   . Cancer Maternal Aunt     breast    History   Social History  . Marital Status: Married    Spouse Name: N/A  . Number of Children: N/A  . Years of Education: N/A   Occupational History  . Not on file.   Social History Main Topics  . Smoking status: Never Smoker   . Smokeless tobacco: Not on file  . Alcohol Use: No  . Drug Use: No  . Sexual Activity: Yes    Birth Control/ Protection: IUD   Other Topics Concern  . Not on file   Social History Narrative     Current outpatient prescriptions:  .  amLODipine (NORVASC) 10 MG tablet, Take 10 mg by mouth daily with breakfast. , Disp: , Rfl:  .  B Complex Vitamins LIQD, Place 1 drop under the tongue daily with breakfast., Disp: , Rfl:  .  BIOTIN PO, Take 10,000 mg by mouth daily., Disp: , Rfl:  .  Black Cohosh 540 MG CAPS, Take 540 mg by mouth daily. , Disp: , Rfl:  .  levonorgestrel (MIRENA) 20 MCG/24HR IUD, 1 each by Intrauterine route once. 20 mcg/24 hr  IUD, Disp: , Rfl:  .  Melatonin 5 MG CAPS, Take 10 mg by mouth at bedtime. Takes 2 at bedtime, Disp: , Rfl:  .  meloxicam (MOBIC) 15 MG tablet, , Disp: , Rfl:  .  meloxicam (MOBIC) 15 MG tablet, Take 15 mg by mouth daily., Disp: , Rfl:  .  Multiple Vitamin (MULTI-VITAMIN DAILY PO), Take 1 tablet by mouth daily with breakfast. Women's, Disp: , Rfl:  .  Red Yeast Rice Extract 600 MG CAPS, Take 1,200 mg by mouth daily., Disp: , Rfl:  .  Tavaborole (KERYDIN) 5 % SOLN, Apply 1 drop topically 1 day or 1 dose. Apply 1 drop to the toenail daily., Disp: 10 mL, Rfl: 2 .  traMADol (ULTRAM) 50 MG tablet, , Disp: ,  Rfl:  .  valsartan-hydrochlorothiazide (DIOVAN-HCT) 320-25 MG per tablet, Take 1 tablet by mouth daily with breakfast. , Disp: , Rfl:   Allergies  Allergen Reactions  . Adhesive [Tape] Itching  . Betadine  [Povidone Iodine]     blisters  . Other     Betadine=blisters skin  ,Cashew Nuts,sunflower seeds-swelling and itching  . Penicillins Hives  . Benadryl [Diphenhydramine Hcl] Rash     Review of Systems  Constitutional: Negative for fever and chills.  Eyes: Negative for blurred vision.  Cardiovascular: Negative for chest pain and palpitations.  Musculoskeletal: Positive for joint pain, arthritis and stiffness. Negative for myalgias and joint swelling.  Neurological: Negative for headaches.      Objective  Filed Vitals:   07/30/14 1038  BP: 130/77  Pulse: 107  Temp: 97.5 F (36.4 C)  TempSrc: Oral  Resp: 18  Height: 5\' 1"  (1.549 m)  Weight: 218 lb (98.884 kg)  SpO2: 94%    Physical Exam  Constitutional: She is oriented to person, place, and time and well-developed, well-nourished, and in no distress.  HENT:  Head: Normocephalic and atraumatic.  Cardiovascular: Normal rate and regular rhythm.   Pulmonary/Chest: Effort normal and breath sounds normal.  Abdominal: Soft. Bowel sounds are normal.  Musculoskeletal:       Right knee: She exhibits normal range of motion, no swelling and no erythema.       Left knee: She exhibits normal range of motion, no swelling and no erythema.  Crepitus in both knees.  Neurological: She is alert and oriented to person, place, and time.  Skin: Skin is warm and dry.  Nursing note and vitals reviewed.   Assessment & Plan 1. Essential hypertension Blood pressure is stable and controlled on present therapy.  2. Hypercholesterolemia Recheck FLP and follow-up. She'll currently on red Yeast Rice - Lipid Profile - Comprehensive metabolic panel  3. Primary osteoarthritis of both knees  - meloxicam (MOBIC) 15 MG tablet; Take 1 tablet  (15 mg total) by mouth daily.  Dispense: 90 tablet; Refill: 1    Briann Sarchet Asad A. Faylene KurtzShah [facility] 07/30/2014 11:02 AM

## 2014-07-31 LAB — COMPREHENSIVE METABOLIC PANEL
ALBUMIN: 4.4 g/dL (ref 3.5–5.5)
ALK PHOS: 62 IU/L (ref 39–117)
ALT: 25 IU/L (ref 0–32)
AST: 22 IU/L (ref 0–40)
Albumin/Globulin Ratio: 1.7 (ref 1.1–2.5)
BUN/Creatinine Ratio: 15 (ref 9–23)
BUN: 14 mg/dL (ref 6–24)
Bilirubin Total: 0.6 mg/dL (ref 0.0–1.2)
CALCIUM: 9.7 mg/dL (ref 8.7–10.2)
CO2: 27 mmol/L (ref 18–29)
CREATININE: 0.92 mg/dL (ref 0.57–1.00)
Chloride: 99 mmol/L (ref 97–108)
GFR calc Af Amer: 85 mL/min/{1.73_m2} (ref >59)
GFR, EST NON AFRICAN AMERICAN: 73 mL/min/{1.73_m2} (ref >59)
GLOBULIN, TOTAL: 2.6 g/dL (ref 1.5–4.5)
GLUCOSE: 94 mg/dL (ref 65–99)
Potassium: 3.7 mmol/L (ref 3.5–5.2)
Sodium: 143 mmol/L (ref 134–144)
TOTAL PROTEIN: 7 g/dL (ref 6.0–8.5)

## 2014-07-31 LAB — LIPID PANEL
CHOL/HDL RATIO: 3.6 ratio (ref 0.0–4.4)
Cholesterol, Total: 216 mg/dL — ABNORMAL HIGH (ref 100–199)
HDL: 60 mg/dL (ref >39)
LDL Calculated: 130 mg/dL — ABNORMAL HIGH (ref 0–99)
Triglycerides: 129 mg/dL (ref 0–149)
VLDL Cholesterol Cal: 26 mg/dL (ref 5–40)

## 2014-08-09 ENCOUNTER — Encounter: Payer: Self-pay | Admitting: Obstetrics and Gynecology

## 2014-08-31 ENCOUNTER — Other Ambulatory Visit: Payer: Self-pay

## 2014-09-03 ENCOUNTER — Encounter: Payer: Self-pay | Admitting: Family Medicine

## 2014-09-03 ENCOUNTER — Other Ambulatory Visit: Payer: Self-pay

## 2014-09-03 ENCOUNTER — Ambulatory Visit (INDEPENDENT_AMBULATORY_CARE_PROVIDER_SITE_OTHER): Payer: Managed Care, Other (non HMO) | Admitting: Family Medicine

## 2014-09-03 VITALS — BP 138/74 | HR 101 | Temp 98.5°F | Resp 19 | Ht 61.0 in | Wt 223.4 lb

## 2014-09-03 DIAGNOSIS — Z1231 Encounter for screening mammogram for malignant neoplasm of breast: Secondary | ICD-10-CM

## 2014-09-03 DIAGNOSIS — Z0000 Encounter for general adult medical examination without abnormal findings: Secondary | ICD-10-CM

## 2014-09-03 NOTE — Progress Notes (Signed)
Name: Taylor Hurst   MRN: [id number]    DOB: 11-22-1965   Date:09/03/2014       Progress Note  Subjective  Chief Complaint  Chief Complaint  Patient presents with  . Annual Exam    Wellness Visit  . Hypertension    HPI Pt. Is here for Wellness Exam plus lab work for [facility].  Pt. Is also requesting a doctor's statement confirming that she had no recurrence of Osteomyelitis since 1988. She had first episode of Osteomyelitis in 1983 in her right knee which was treated with surgical debridement and Antibiotics at [facility], which healed uneventfully. The second episode was in 1988 in her left hip, which was again treated with surgical debridement in 1988 at [facility]. She had no recurrence of osteomyelitis since then. Records from [facility] are not available but pt. will try to request them for [place].  Past Medical History  Diagnosis Date  . Nausea   . Hypertension   . GERD (gastroesophageal reflux disease)   . Arthritis   . Chronic cholecystitis 05/30/2011    Past Surgical History  Procedure Laterality Date  . Osteomilitis      right and left leg/surgery; right 1983; left 1988  . Osteomylitis  4098,1191    right leg, left leg  . Intraoperative cholangiogram  07/26/2011    Procedure: INTRAOPERATIVE CHOLANGIOGRAM;  Surgeon: Ardeth Sportsman, MD;  Location: [facility]S;  Service: General;  Laterality: N/A;  . Liver biopsy  07/26/2011    Procedure: LIVER BIOPSY;  Surgeon: Ardeth Sportsman, MD;  Location: [facility];  Service: General;  Laterality: N/A;  core liver biopsy   . Cholecystectomy  07/26/11    Family History  Problem Relation Age of Onset  . COPD Mother   . Hypertension Mother   . Hyperlipidemia Mother   . Alcohol abuse Father   . Cancer Maternal Aunt     breast    Social History   Social History  . Marital Status: Married    Spouse Name: N/A  . Number of Children: N/A  . Years of Education: N/A   Occupational History  . Not on file.   Social History Main Topics  .  Smoking status: Never Smoker   . Smokeless tobacco: Not on file  . Alcohol Use: No  . Drug Use: No  . Sexual Activity: Yes    Birth Control/ Protection: IUD   Other Topics Concern  . Not on file   Social History Narrative     Current outpatient prescriptions:  .  ALREX 0.2 % SUSP, INSTILL 1 DROP INTO BOTH EYES QID FOR 2 WEEKS, Disp: , Rfl: 1 .  amLODipine (NORVASC) 10 MG tablet, Take 10 mg by mouth daily with breakfast. , Disp: , Rfl:  .  B Complex Vitamins LIQD, Place 1 drop under the tongue daily with breakfast., Disp: , Rfl:  .  BEPREVE 1.5 % SOLN, INSTILL 1 DROP INTO BOTH EYES BID, Disp: , Rfl: 3 .  BIOTIN PO, Take 10,000 mg by mouth daily., Disp: , Rfl:  .  Black Cohosh 540 MG CAPS, Take 540 mg by mouth daily. , Disp: , Rfl:  .  levonorgestrel (MIRENA) 20 MCG/24HR IUD, 1 each by Intrauterine route once. 20 mcg/24 hr IUD, Disp: , Rfl:  .  Melatonin 5 MG CAPS, Take 10 mg by mouth at bedtime. Takes 2 at bedtime, Disp: , Rfl:  .  meloxicam (MOBIC) 15 MG tablet, Take 1 tablet (15 mg total) by mouth  daily., Disp: 90 tablet, Rfl: 1 .  Multiple Vitamin (MULTI-VITAMIN DAILY PO), Take 1 tablet by mouth daily with breakfast. Women's, Disp: , Rfl:  .  Red Yeast Rice Extract 600 MG CAPS, Take 1,200 mg by mouth daily., Disp: , Rfl:  .  Tavaborole (KERYDIN) 5 % SOLN, Apply 1 drop topically 1 day or 1 dose. Apply 1 drop to the toenail daily., Disp: 10 mL, Rfl: 2 .  traMADol (ULTRAM) 50 MG tablet, , Disp: , Rfl:  .  valsartan-hydrochlorothiazide (DIOVAN-HCT) 320-25 MG per tablet, Take 1 tablet by mouth daily with breakfast. , Disp: , Rfl:   Allergies  Allergen Reactions  . Adhesive [Tape] Itching  . Betadine  [Povidone Iodine]     blisters  . Other     Betadine=blisters skin  ,Cashew Nuts,sunflower seeds-swelling and itching  . Penicillins Hives  . Benadryl [Diphenhydramine Hcl] Rash     Review of Systems  Constitutional: Positive for malaise/fatigue. Negative for fever, chills and  weight loss.  HENT: Negative for congestion and ear pain.   Eyes: Negative for blurred vision and pain.  Respiratory: Negative for cough and shortness of breath.   Cardiovascular: Negative for chest pain, palpitations and leg swelling.  Gastrointestinal: Negative for heartburn, nausea and vomiting.  Genitourinary: Negative for dysuria, urgency and frequency.  Musculoskeletal: Positive for joint pain. Negative for myalgias and back pain.  Skin: Negative for rash.  Neurological: Negative for dizziness, tingling, seizures, loss of consciousness and headaches.  Endo/Heme/Allergies: Negative for environmental allergies.  Psychiatric/Behavioral: Negative for depression. The patient is not nervous/anxious.      Objective  Filed Vitals:   09/03/14 0938  BP: 138/74  Pulse: 101  Temp: 98.5 F (36.9 C)  TempSrc: Oral  Resp: 19  Height:  (1.549 m)  Weight: 223 lb 6.4 oz (101.334 kg)  SpO2: 95%    Physical Exam  Constitutional: She is oriented to person, place, and time and well-developed, well-nourished, and in no distress.  HENT:  Head: Normocephalic and atraumatic.  Eyes: Pupils are equal, round, and reactive to light.  Cardiovascular: Normal rate and regular rhythm.   Pulmonary/Chest: Effort normal and breath sounds normal.  Abdominal: Soft. Bowel sounds are normal.  Genitourinary:  Deferred. Done at GYN  Musculoskeletal:       Right ankle: She exhibits swelling.       Left ankle: She exhibits swelling.  Neurological: She is alert and oriented to person, place, and time. Gait normal.  Skin: Skin is warm and dry.  Nursing note and vitals reviewed.   Assessment & Plan  1. Annual physical exam Annual exam and biometric screening performed. Patient follows up with gynecology for pelvic and breast exams.  - Comprehensive metabolic panel   Giovani Neumeister Asad A. Faylene Kurtz [facility] 09/03/2014 9:49 AM

## 2014-09-04 LAB — COMPREHENSIVE METABOLIC PANEL
ALBUMIN: 4.2 g/dL (ref 3.5–5.5)
ALK PHOS: 61 IU/L (ref 39–117)
ALT: 20 IU/L (ref 0–32)
AST: 18 IU/L (ref 0–40)
Albumin/Globulin Ratio: 1.9 (ref 1.1–2.5)
BILIRUBIN TOTAL: 0.5 mg/dL (ref 0.0–1.2)
BUN / CREAT RATIO: 16 (ref 9–23)
BUN: 14 mg/dL (ref 6–24)
CHLORIDE: 101 mmol/L (ref 97–108)
CO2: 27 mmol/L (ref 18–29)
Calcium: 9.2 mg/dL (ref 8.7–10.2)
Creatinine, Ser: 0.86 mg/dL (ref 0.57–1.00)
GFR calc non Af Amer: 80 mL/min/{1.73_m2} (ref >59)
GFR, EST AFRICAN AMERICAN: 92 mL/min/{1.73_m2} (ref >59)
GLOBULIN, TOTAL: 2.2 g/dL (ref 1.5–4.5)
Glucose: 93 mg/dL (ref 65–99)
Potassium: 4.1 mmol/L (ref 3.5–5.2)
SODIUM: 144 mmol/L (ref 134–144)
TOTAL PROTEIN: 6.4 g/dL (ref 6.0–8.5)

## 2014-09-09 ENCOUNTER — Encounter: Payer: Self-pay | Admitting: Family Medicine

## 2014-09-13 ENCOUNTER — Telehealth: Payer: Self-pay | Admitting: Family Medicine

## 2014-09-13 NOTE — Telephone Encounter (Signed)
Patient states that she was seen on 07-30-14 however she received a bill for this date of service and does not understand why? States she does have insurance on file.

## 2014-09-15 ENCOUNTER — Ambulatory Visit: Payer: Managed Care, Other (non HMO) | Admitting: Family Medicine

## 2014-09-15 ENCOUNTER — Telehealth: Payer: Self-pay | Admitting: Family Medicine

## 2014-09-15 DIAGNOSIS — M25562 Pain in left knee: Principal | ICD-10-CM

## 2014-09-15 DIAGNOSIS — M25561 Pain in right knee: Secondary | ICD-10-CM

## 2014-09-15 DIAGNOSIS — M170 Bilateral primary osteoarthritis of knee: Secondary | ICD-10-CM

## 2014-09-15 NOTE — Telephone Encounter (Signed)
X-ray has been ordered

## 2014-09-15 NOTE — Telephone Encounter (Signed)
This message has been sent to the [facility] billing department for review. Pt was informed at her OV on 09/15/14.

## 2014-09-16 ENCOUNTER — Encounter: Payer: Self-pay | Admitting: Family Medicine

## 2014-09-17 ENCOUNTER — Ambulatory Visit
Admission: RE | Admit: 2014-09-17 | Discharge: 2014-09-17 | Disposition: A | Discharge status: Home / Self Care | Payer: Managed Care, Other (non HMO) | Source: Ambulatory Visit

## 2014-09-17 DIAGNOSIS — Z1231 Encounter for screening mammogram for malignant neoplasm of breast: Secondary | ICD-10-CM

## 2014-09-23 ENCOUNTER — Encounter: Payer: Self-pay | Admitting: Obstetrics and Gynecology

## 2014-09-23 ENCOUNTER — Ambulatory Visit (INDEPENDENT_AMBULATORY_CARE_PROVIDER_SITE_OTHER): Payer: Managed Care, Other (non HMO) | Admitting: Obstetrics and Gynecology

## 2014-09-23 DIAGNOSIS — Z23 Encounter for immunization: Secondary | ICD-10-CM | POA: Diagnosis not present

## 2014-09-23 DIAGNOSIS — M7660 Achilles tendinitis, unspecified leg: Secondary | ICD-10-CM

## 2014-09-23 DIAGNOSIS — M7661 Achilles tendinitis, right leg: Secondary | ICD-10-CM | POA: Diagnosis not present

## 2014-09-23 DIAGNOSIS — Z01419 Encounter for gynecological examination (general) (routine) without abnormal findings: Secondary | ICD-10-CM | POA: Diagnosis not present

## 2014-09-23 DIAGNOSIS — M75100 Unspecified rotator cuff tear or rupture of unspecified shoulder, not specified as traumatic: Secondary | ICD-10-CM

## 2014-09-23 DIAGNOSIS — Z113 Encounter for screening for infections with a predominantly sexual mode of transmission: Secondary | ICD-10-CM

## 2014-09-23 HISTORY — DX: Unspecified rotator cuff tear or rupture of unspecified shoulder, not specified as traumatic: M75.100

## 2014-09-23 HISTORY — DX: Achilles tendinitis, unspecified leg: M76.60

## 2014-09-23 NOTE — Progress Notes (Signed)
GYNECOLOGY PROGRESS NOTE  Subjective:    Taylor Hurst is a 49 y.o. P31 female who presents for an annual exam. The patient has no complaints today. The patient is sexually active. GYN screening history: last pap: approximate date 08/2013 and was normal and last mammogram: approximate date 08/2014 and patient unaware of recent results. The patient wears seatbelts: yes. The patient participates in regular exercise: no. Has the patient ever been transfused or tattooed?: tattooed. The patient reports that there is not domestic violence in her life.   Menstrual History: Obstetric History   G2   P2   T2   P0   A0   TAB0   SAB0   E0   M0   L2     # Outcome Date GA Lbr Len/2nd Weight Sex Delivery Anes PTL Lv  2 Term 1986   7 lb 1 oz (3.204 kg) M Vag-Spont   Y  1 Term 1983   7 lb 6 oz (3.345 kg) F Vag-Spont   Y     Menarche age: 89 No LMP recorded. Patient is not currently having periods (Reason: IUD).  Denies h/o abnormal menses or STIs. Denies h/o abnormal pap smears.   Past Medical History  Diagnosis Date  . Nausea   . Hypertension   . GERD (gastroesophageal reflux disease)   . Arthritis   . Chronic cholecystitis 05/30/2011  . Achilles tendinitis 09/23/2014    Right leg   . Torn rotator cuff 09/23/2014    Right side     Past Surgical History  Procedure Laterality Date  . Osteomilitis      right and left leg/surgery; right 1983; left 1988  . Osteomylitis  [id number]    right leg, left leg  . Intraoperative cholangiogram  07/26/2011    Procedure: INTRAOPERATIVE CHOLANGIOGRAM;  Surgeon: Ardeth Sportsman, MD;  Location: [facility];  Service: General;  Laterality: N/A;  . Liver biopsy  07/26/2011    Procedure: LIVER BIOPSY;  Surgeon: Ardeth Sportsman, MD;  Location: [facility];  Service: General;  Laterality: N/A;  core liver biopsy   . Cholecystectomy  07/26/11    Family History  Problem Relation Age of Onset  . COPD Mother   . Hypertension Mother   . Hyperlipidemia Mother   . Alcohol abuse  Father   . Cancer Maternal Aunt     breast    Social History   Social History  . Marital Status: Married    Spouse Name: N/A  . Number of Children: N/A  . Years of Education: N/A   Occupational History  . Not on file.   Social History Main Topics  . Smoking status: Never Smoker   . Smokeless tobacco: Not on file  . Alcohol Use: No  . Drug Use: No  . Sexual Activity: Yes    Birth Control/ Protection: IUD   Other Topics Concern  . Not on file   Social History Narrative    Current Outpatient Prescriptions on File Prior to Visit  Medication Sig Dispense Refill  . ALREX 0.2 % SUSP INSTILL 1 DROP INTO BOTH EYES QID FOR 2 WEEKS  1  . amLODipine (NORVASC) 10 MG tablet Take 10 mg by mouth daily with breakfast.     . B Complex Vitamins LIQD Place 1 drop under the tongue daily with breakfast.    . BEPREVE 1.5 % SOLN INSTILL 1 DROP INTO BOTH EYES BID  3  . BIOTIN PO Take 10,000 mg by mouth daily.    Marland Kitchen  Black Cohosh 540 MG CAPS Take 540 mg by mouth daily.     Marland Kitchen levonorgestrel (MIRENA) 20 MCG/24HR IUD 1 each by Intrauterine route once. 20 mcg/24 hr IUD    . Melatonin 5 MG CAPS Take 10 mg by mouth at bedtime. Takes 2 at bedtime    . meloxicam (MOBIC) 15 MG tablet Take 1 tablet (15 mg total) by mouth daily. 90 tablet 1  . Multiple Vitamin (MULTI-VITAMIN DAILY PO) Take 1 tablet by mouth daily with breakfast. Women's    . Red Yeast Rice Extract 600 MG CAPS Take 1,200 mg by mouth daily.    . Tavaborole (KERYDIN) 5 % SOLN Apply 1 drop topically 1 day or 1 dose. Apply 1 drop to the toenail daily. 10 mL 2  . traMADol (ULTRAM) 50 MG tablet     . valsartan-hydrochlorothiazide (DIOVAN-HCT) 320-25 MG per tablet Take 1 tablet by mouth daily with breakfast.      No current facility-administered medications on file prior to visit.    Allergies  Allergen Reactions  . Adhesive [Tape] Itching  . Betadine  [Povidone Iodine]     blisters  . Other     Betadine=blisters skin  ,Cashew  Nuts,sunflower seeds-swelling and itching  . Penicillins Hives  . Benadryl [Diphenhydramine Hcl] Rash     Review of Systems Constitutional: negative for chills, fatigue, fevers and sweats Eyes: negative for irritation, redness and visual disturbance Ears, nose, mouth, throat, and face: negative for hearing loss, nasal congestion, snoring and tinnitus Respiratory: negative for asthma, cough, sputum Cardiovascular: negative for chest pain, dyspnea, exertional chest pressure/discomfort, irregular heart beat, palpitations and syncope Gastrointestinal: negative for abdominal pain, change in bowel habits, nausea and vomiting Genitourinary: negative for abnormal menstrual periods, genital lesions, sexual problems and vaginal discharge, dysuria and urinary incontinence Integument/breast: negative for breast lump, breast tenderness and nipple discharge Hematologic/lymphatic: negative for bleeding and easy bruising Musculoskeletal:negative for back pain and muscle weakness Neurological: negative for dizziness, headaches, vertigo and weakness Endocrine: negative for diabetic symptoms including polydipsia, polyuria and skin dryness Allergic/Immunologic: negative for hay fever and urticaria    Objective:   Blood pressure 130/77, pulse 84, height 5\' 1"  (1.549 m), weight 217 lb 12.8 oz (98.793 kg).  General Appearance:    Alert, cooperative, no distress, appears stated age; obese  Head:    Normocephalic, without obvious abnormality, atraumatic  Eyes:    PERRL, conjunctiva/corneas clear, EOM's intact, both eyes  Ears:    Normal external ear canals, both ears  Nose:   Nares normal, septum midline, mucosa normal, no drainage or sinus tenderness  Throat:   Lips, mucosa, and tongue normal; teeth and gums normal  Neck:   Supple, symmetrical, trachea midline, no adenopathy; thyroid: no enlargement/tenderness/nodules; no carotid bruit or JVD  Back:     Symmetric, no curvature, ROM normal, no CVA tenderness   Lungs:     Clear to auscultation bilaterally, respirations unlabored  Chest Wall:    No tenderness or deformity   Heart:    Regular rate and rhythm, S1 and S2 normal, no murmur, rub or gallop  Breast Exam:    No tenderness, masses, or nipple abnormality  Abdomen:     Soft, non-tender, bowel sounds active all four quadrants, no masses, no organomegaly.   Genitalia:    Pelvic:external genitalia normal. Vagina without lesions, discharge, or tenderness; rectovaginal septum  normal. Cervix normal in appearance, no cervical motion tenderness.  Uterus normal size and shape, mobile, nontender.  No adnexal masses  or tenderness.   Rectal:    Normal external sphincter.  No hemorrhoids appreciated. Internal exam not done.   Extremities:   Extremities normal, atraumatic, no cyanosis or edema  Pulses:   2+ and symmetric all extremities  Skin:   Skin color, texture, turgor normal, no rashes or lesions  Lymph nodes:   Cervical, supraclavicular, and axillary nodes normal  Neurologic:   CNII-XII intact, normal strength, sensation and reflexes throughout     Labs:  Office Visit on 09/03/2014  Component Date Value Ref Range Status  . Glucose 09/03/2014 93  65 - 99 mg/dL Final  . BUN 60/45/4098 14  6 - 24 mg/dL Final  . Creatinine, Ser 09/03/2014 0.86  0.57 - 1.00 mg/dL Final  . GFR calc non Af Amer 09/03/2014 80  >59 mL/min/1.73 Final  . GFR calc Af Amer 09/03/2014 92  >59 mL/min/1.73 Final  . BUN/Creatinine Ratio 09/03/2014 16  9 - 23 Final  . Sodium 09/03/2014 144  134 - 144 mmol/L Final  . Potassium 09/03/2014 4.1  3.5 - 5.2 mmol/L Final  . Chloride 09/03/2014 101  97 - 108 mmol/L Final  . CO2 09/03/2014 27  18 - 29 mmol/L Final  . Calcium 09/03/2014 9.2  8.7 - 10.2 mg/dL Final  . Total Protein 09/03/2014 6.4  6.0 - 8.5 g/dL Final  . Albumin 11/91/4782 4.2  3.5 - 5.5 g/dL Final  . Globulin, Total 09/03/2014 2.2  1.5 - 4.5 g/dL Final  . Albumin/Globulin Ratio 09/03/2014 1.9  1.1 - 2.5 Final  .  Bilirubin Total 09/03/2014 0.5  0.0 - 1.2 mg/dL Final  . Alkaline Phosphatase 09/03/2014 61  39 - 117 IU/L Final  . AST 09/03/2014 18  0 - 40 IU/L Final  . ALT 09/03/2014 20  0 - 32 IU/L Final    Assessment:    Healthy female exam.   H/o HTN (controlled).  Obesity   Plan:     Blood tests: CBC with diff, Lipoproteins, TSH, and STI serology (excpet HIV, patient declined). Breast self exam technique reviewed and patient encouraged to perform self-exam monthly. Chlamydia and GC specimen. Discussed healthy lifestyle modifications. Pap smear up to date.   Contraception: Mirena IUD (has been in place x 5 years).  Desires removal and reinsertion of new IUD.  Discussed alternative methods of contraception.  Patient still desires new Mirena.  Will schedule for removal and reinsertion in 2-3 weeks.  Referral for screening colonoscopy.  Desires flu vaccine and Tdap.  Administered today.  Follow up in 1 year for annual exam.    Hildred Laser, MD [facility]

## 2014-09-24 ENCOUNTER — Encounter: Payer: Self-pay | Admitting: Obstetrics and Gynecology

## 2014-09-24 LAB — HEPATITIS C ANTIBODY: Hep C Virus Ab: <0.1 s/co ratio (ref 0.0–0.9)

## 2014-09-24 LAB — GC/CHLAMYDIA PROBE AMP
CHLAMYDIA, DNA PROBE: NEGATIVE
NEISSERIA GONORRHOEAE BY PCR: NEGATIVE

## 2014-09-24 LAB — RPR: RPR Ser Ql: NONREACTIVE

## 2014-09-24 LAB — HEPATITIS B SURFACE ANTIGEN: Hepatitis B Surface Ag: NEGATIVE

## 2014-10-14 ENCOUNTER — Ambulatory Visit (INDEPENDENT_AMBULATORY_CARE_PROVIDER_SITE_OTHER): Payer: Managed Care, Other (non HMO) | Admitting: Obstetrics and Gynecology

## 2014-10-14 ENCOUNTER — Encounter: Payer: Self-pay | Admitting: Obstetrics and Gynecology

## 2014-10-14 VITALS — BP 118/74 | HR 75 | Ht 61.0 in | Wt 216.8 lb

## 2014-10-14 DIAGNOSIS — Z30433 Encounter for removal and reinsertion of intrauterine contraceptive device: Secondary | ICD-10-CM | POA: Diagnosis not present

## 2014-10-14 LAB — POCT URINE PREGNANCY: PREG TEST UR: NEGATIVE

## 2014-10-14 NOTE — Progress Notes (Signed)
    GYNECOLOGY CLINIC PROCEDURE NOTE  Taylor Hurst is a 49 y.o. [id number] here for Mirena IUD removal and reinsertion. No GYN concerns.  Last pap smear was on 08/2013 and was normal.   Filed Vitals:   10/14/14 0939  BP: 118/74  Pulse: 75    IUD Removal and Reinsertion  Patient identified, informed consent performed, consent signed.   Discussed risks of irregular bleeding, cramping, infection, malpositioning or misplacement of the IUD outside the uterus which may require further procedures. Also advised to use backup contraception for one week as the risk of pregnancy is higher during the transition period of removing an IUD and replacing it with another one. Time out was performed. Speculum placed in the vagina. The strings of the IUD were not easily visible, and the cervical os was noted to be stenotic.  A cytobrush was used to attempt to grasp the strings, however was unsuccessful.  The cervix was then dilated and a IUD hook was passed with retrieval of the IUD strings.  The strings were then grasped and pulled using ring forceps. The IUD was successfully removed in its entirety. The cervix was cleaned with a soapy water mix times 2 and grasped anteriorly with a single tooth tenaculum.  The uterus was sounded to 7 cm.  The new Mirena IUD insertion apparatus was then placed per manufacturer's recommendations. Strings trimmed to 3 cm. Tenaculum was removed, good hemostasis noted. Patient tolerated procedure well.   Patient was given post-procedure instructions.  She was reminded to have backup contraception for one week during this transition period between IUDs.  Patient was also asked to check IUD strings periodically and follow up in 4 weeks for IUD check.    Hildred Laser, MD [facility]

## 2014-10-18 ENCOUNTER — Ambulatory Visit: Payer: Self-pay | Admitting: Orthopedic Surgery

## 2014-10-20 ENCOUNTER — Encounter: Payer: Self-pay | Admitting: Obstetrics and Gynecology

## 2014-11-10 ENCOUNTER — Ambulatory Visit (INDEPENDENT_AMBULATORY_CARE_PROVIDER_SITE_OTHER): Payer: Managed Care, Other (non HMO) | Admitting: Obstetrics and Gynecology

## 2014-11-10 ENCOUNTER — Encounter: Payer: Self-pay | Admitting: Obstetrics and Gynecology

## 2014-11-10 VITALS — BP 140/83 | HR 78 | Resp 16 | Ht 61.0 in | Wt 211.8 lb

## 2014-11-10 DIAGNOSIS — Z30431 Encounter for routine checking of intrauterine contraceptive device: Secondary | ICD-10-CM | POA: Diagnosis not present

## 2014-11-10 NOTE — Progress Notes (Signed)
GYNECOLOGY CLINIC PROGRESS NOTE  History:  49 y.o. [id number] here today for today for IUD string check; Mirena IUD was placed 4 weeks ago. No complaints about the IUD, no concerning side effects.  The following portions of the patient's history were reviewed and updated as appropriate: allergies, current medications, past family history, past medical history, past social history, past surgical history and problem list. Last pap smear on 08/2013 was normal, negative HRHPV.  Review of Systems:  Pertinent items are noted in HPI.  Objective:  Physical Exam Blood pressure 140/83, pulse 78, resp. rate 16, height 5\' 1"  (1.549 m), weight 211 lb 12.8 oz (96.072 kg). CONSTITUTIONAL: Well-developed, well-nourished female in no acute distress.  ABDOMEN: Soft, no distention noted.   PELVIC: Normal appearing external genitalia; normal appearing vaginal mucosa and cervix.  IUD strings visualized, about 2 cm in length outside cervix.   Assessment & Plan:  Normal IUD check. Patient to keep IUD in place for five years; can come in for removal if she desires within the next five years. Routine preventative health maintenance measures emphasized.   Hildred LaserAnika Prabhleen Montemayor, MD [facility]

## 2014-11-11 ENCOUNTER — Other Ambulatory Visit (HOSPITAL_COMMUNITY): Payer: Self-pay | Admitting: *Deleted

## 2014-11-11 ENCOUNTER — Ambulatory Visit: Payer: Managed Care, Other (non HMO) | Admitting: Obstetrics and Gynecology

## 2014-11-11 ENCOUNTER — Ambulatory Visit (INDEPENDENT_AMBULATORY_CARE_PROVIDER_SITE_OTHER): Payer: Managed Care, Other (non HMO) | Admitting: Podiatry

## 2014-11-11 ENCOUNTER — Encounter: Payer: Self-pay | Admitting: Podiatry

## 2014-11-11 VITALS — BP 120/72 | HR 89 | Resp 18

## 2014-11-11 DIAGNOSIS — B351 Tinea unguium: Secondary | ICD-10-CM | POA: Diagnosis not present

## 2014-11-11 DIAGNOSIS — M7661 Achilles tendinitis, right leg: Secondary | ICD-10-CM

## 2014-11-11 MED ORDER — TAVABOROLE 5 % EX SOLN: 1.0000 [drp] | CUTANEOUS | Status: DC

## 2014-11-11 NOTE — Patient Instructions (Addendum)
Taylor Hurst  11/11/2014   Your procedure is scheduled on: 11-18-14  Report to [facility] Main  Entrance take [facility]  elevators to 3rd floor to  Short Stay Center at 830 AM.  Call this number if you have problems the morning of surgery [phone]   Remember: ONLY 1 PERSON MAY GO WITH YOU TO SHORT STAY TO GET  READY MORNING OF YOUR SURGERY.  Do not eat food or drink liquids :After Midnight.     Take these medicines the morning of surgery with A SIP OF WATER: amlodipine (norvasc), may use eye drops if needed (bring with you day of surgery)                               You may not have any metal on your body including hair pins and              piercings  Do not wear jewelry, make-up, lotions, powders or perfumes, deodorant             Do not wear nail polish.  Do not shave  48 hours prior to surgery.             Do not bring valuables to the hospital. [facility] IS NOT             RESPONSIBLE   FOR VALUABLES.  Contacts, dentures or bridgework may not be worn into surgery.  Leave suitcase in the car. After surgery it may be brought to your room.              Please read over the following fact sheets you were given:INCENTIVE SPIROMETER _____________________________________________________________________             [facility] - Preparing for Surgery Before surgery, you can play an important role.  Because skin is not sterile, your skin needs to be as free of germs as possible.  You can reduce the number of germs on your skin by washing with CHG (chlorahexidine gluconate) soap before surgery.  CHG is an antiseptic cleaner which kills germs and bonds with the skin to continue killing germs even after washing. Please DO NOT use if you have an allergy to CHG or antibacterial soaps.  If your skin becomes reddened/irritated stop using the CHG and inform your nurse when you arrive at Short Stay. Do not shave (including legs and underarms) for at least 48 hours  prior to the first CHG shower.  You may shave your face/neck. Please follow these instructions carefully:  1.  Shower with CHG Soap the night before surgery and the  morning of Surgery.  2.  If you choose to wash your hair, wash your hair first as usual with your  normal  shampoo.  3.  After you shampoo, rinse your hair and body thoroughly to remove the  shampoo.                           4.  Use CHG as you would any other liquid soap.  You can apply chg directly  to the skin and wash                       Gently with a scrungie or clean washcloth.  5.  Apply the CHG Soap  to your body ONLY FROM THE NECK DOWN.   Do not use on face/ open                           Wound or open sores. Avoid contact with eyes, ears mouth and genitals (private parts).                       Wash face,  Genitals (private parts) with your normal soap.             6.  Wash thoroughly, paying special attention to the area where your surgery  will be performed.  7.  Thoroughly rinse your body with warm water from the neck down.  8.  DO NOT shower/wash with your normal soap after using and rinsing off  the CHG Soap.                9.  Pat yourself dry with a clean towel.            10.  Wear clean pajamas.            11.  Place clean sheets on your bed the night of your first shower and do not  sleep with pets. Day of Surgery : Do not apply any lotions/deodorants the morning of surgery.  Please wear clean clothes to the hospital/surgery center.  FAILURE TO FOLLOW THESE INSTRUCTIONS MAY RESULT IN THE CANCELLATION OF YOUR SURGERY PATIENT SIGNATURE_________________________________  NURSE SIGNATURE__________________________________  ________________________________________________________________________   Rogelia Mire  An incentive spirometer is a tool that can help keep your lungs clear and active. This tool measures how well you are filling your lungs with each breath. Taking long deep breaths may help reverse  or decrease the chance of developing breathing (pulmonary) problems (especially infection) following:  A long period of time when you are unable to move or be active. BEFORE THE PROCEDURE   If the spirometer includes an indicator to show your best effort, your nurse or respiratory therapist will set it to a desired goal.  If possible, sit up straight or lean slightly forward. Try not to slouch.  Hold the incentive spirometer in an upright position. INSTRUCTIONS FOR USE  1. Sit on the edge of your bed if possible, or sit up as far as you can in bed or on a chair. 2. Hold the incentive spirometer in an upright position. 3. Breathe out normally. 4. Place the mouthpiece in your mouth and seal your lips tightly around it. 5. Breathe in slowly and as deeply as possible, raising the piston or the ball toward the top of the column. 6. Hold your breath for 3-5 seconds or for as long as possible. Allow the piston or ball to fall to the bottom of the column. 7. Remove the mouthpiece from your mouth and breathe out normally. 8. Rest for a few seconds and repeat Steps 1 through 7 at least 10 times every 1-2 hours when you are awake. Take your time and take a few normal breaths between deep breaths. 9. The spirometer may include an indicator to show your best effort. Use the indicator as a goal to work toward during each repetition. 10. After each set of 10 deep breaths, practice coughing to be sure your lungs are clear. If you have an incision (the cut made at the time of surgery), support your incision when coughing by placing a pillow or rolled up towels firmly  against it. Once you are able to get out of bed, walk around indoors and cough well. You may stop using the incentive spirometer when instructed by your caregiver.  RISKS AND COMPLICATIONS  Take your time so you do not get dizzy or light-headed.  If you are in pain, you may need to take or ask for pain medication before doing incentive  spirometry. It is harder to take a deep breath if you are having pain. AFTER USE  Rest and breathe slowly and easily.  It can be helpful to keep track of a log of your progress. Your caregiver can provide you with a simple table to help with this. If you are using the spirometer at home, follow these instructions: SEEK MEDICAL CARE IF:   You are having difficultly using the spirometer.  You have trouble using the spirometer as often as instructed.  Your pain medication is not giving enough relief while using the spirometer.  You develop fever of 100.5 F (38.1 C) or higher. SEEK IMMEDIATE MEDICAL CARE IF:   You cough up bloody sputum that had not been present before.  You develop fever of 102 F (38.9 C) or greater.  You develop worsening pain at or near the incision site. MAKE SURE YOU:   Understand these instructions.  Will watch your condition.  Will get help right away if you are not doing well or get worse. Document Released: 05/14/2006 Document Revised: 03/26/2011 Document Reviewed: 07/15/2006 [facility] Patient Information 2014 [employer] [place].   ________________________________________________________________________

## 2014-11-11 NOTE — Progress Notes (Signed)
Patient ID: Taylor GibsonJustina F Hurst, female   DOB: 07/16/1965, 49 y.o.   MRN: [id number]  Subjective:  49 year old female presents the office they for follow-up evaluation of right Achilles tendinitis hypertrophy of Achilles tendon. She states that she went to physical therapy has helped some. She does continue to have pain to the Achilles tendon and she continues have a knot overlying the tendon. The pain is becoming less frequent but does continue. She also states that she has been continuing with Kerydin  On the left hallux toenail the nail appears to be growing on doing well. She denies a pain or redness or drainage to the nail sites. No other complaints at this time in no acute changes since last appointment. Denies any systemic complaints such as fevers, chills, nausea, vomiting. No calf pain, chest pain, shortness of breath.   Objective:  AAO 3, NAD  DP/PT pulses palpable 2/4, CRT less than 3 seconds  Protective sensation intact with Taylor Hurst Weinstein monofilament  There is continuation of tenderness in the mid substance the right Achilles tendon there does appear to be hypertrophy of the tendon. There is no overlying edema, erythema, increase in warmth. Calf and test is negative. There is no specific defect of the Achilles tendon this time. Is no tenderness the foot or ankle. MMT 5/5, ROM WNL.  The left hallux nail  Appears to be growing out. There is discoloration of the distal portion of the nail however the proximal nail appears to be clear. There is no tenderness palpation on the toenails versus running erythema or drainage. There is no other areas of tenderness bilateral lower extremitiies. No open lesions or pre-ulcerative lesions. No pain with calf compression, swelling, warmth, erythema.  Assessment:  49 year old female continuation of right Achilles tendinitis;  Onychomycosis which is resolving  Plan: -Treatment options discussed including all alternatives, risks, and complications -At this  time as she continues to have tenderness and hypertrophy of the tendon I recommended MRI. This is for surgical planning. She agrees to the plan -Refilled Jodi GeraldsKerydin -Follow-up after MRI or sooner if any problems arise. In the meantime, encouraged to call the office with any questions, concerns, change in symptoms.   Ovid CurdMatthew Jone Panebianco, DPM

## 2014-11-12 ENCOUNTER — Telehealth: Payer: Self-pay | Admitting: *Deleted

## 2014-11-12 ENCOUNTER — Encounter (HOSPITAL_COMMUNITY)
Admission: RE | Admit: 2014-11-12 | Discharge: 2014-11-12 | Disposition: A | Discharge status: Home / Self Care | Payer: Managed Care, Other (non HMO) | Source: Ambulatory Visit | Attending: Specialist | Admitting: Specialist

## 2014-11-12 ENCOUNTER — Encounter (HOSPITAL_COMMUNITY): Payer: Self-pay

## 2014-11-12 DIAGNOSIS — M75120 Complete rotator cuff tear or rupture of unspecified shoulder, not specified as traumatic: Secondary | ICD-10-CM | POA: Diagnosis not present

## 2014-11-12 DIAGNOSIS — Z01818 Encounter for other preprocedural examination: Secondary | ICD-10-CM | POA: Insufficient documentation

## 2014-11-12 DIAGNOSIS — S86011A Strain of right Achilles tendon, initial encounter: Secondary | ICD-10-CM

## 2014-11-12 HISTORY — DX: Inflammatory liver disease, unspecified: K75.9

## 2014-11-12 HISTORY — DX: Personal history of other medical treatment: Z92.89

## 2014-11-12 HISTORY — DX: Cardiac murmur, unspecified: R01.1

## 2014-11-12 HISTORY — DX: Osteomyelitis, unspecified: M86.9

## 2014-11-12 LAB — COMPREHENSIVE METABOLIC PANEL
ALBUMIN: 4.3 g/dL (ref 3.5–5.0)
ALK PHOS: 57 U/L (ref 38–126)
ALT: 22 U/L (ref 14–54)
ANION GAP: 7 (ref 5–15)
AST: 20 U/L (ref 15–41)
BILIRUBIN TOTAL: 0.8 mg/dL (ref 0.3–1.2)
BUN: 21 mg/dL — AB (ref 6–20)
CALCIUM: 9.4 mg/dL (ref 8.9–10.3)
CO2: 32 mmol/L (ref 22–32)
Chloride: 102 mmol/L (ref 101–111)
Creatinine, Ser: 0.86 mg/dL (ref 0.44–1.00)
GFR calc Af Amer: >60 mL/min (ref >60)
GFR calc non Af Amer: >60 mL/min (ref >60)
GLUCOSE: 100 mg/dL — AB (ref 65–99)
Potassium: 3.6 mmol/L (ref 3.5–5.1)
SODIUM: 141 mmol/L (ref 135–145)
TOTAL PROTEIN: 7.6 g/dL (ref 6.5–8.1)

## 2014-11-12 LAB — CBC
HEMATOCRIT: 36.5 % (ref 36.0–46.0)
HEMOGLOBIN: 11.7 g/dL — AB (ref 12.0–15.0)
MCH: 28.7 pg (ref 26.0–34.0)
MCHC: 32.1 g/dL (ref 30.0–36.0)
MCV: 89.7 fL (ref 78.0–100.0)
Platelets: 299 10*3/uL (ref 150–400)
RBC: 4.07 MIL/uL (ref 3.87–5.11)
RDW: 13.5 % (ref 11.5–15.5)
WBC: 5.7 10*3/uL (ref 4.0–10.5)

## 2014-11-12 LAB — HCG, SERUM, QUALITATIVE: Preg, Serum: NEGATIVE

## 2014-11-12 NOTE — Progress Notes (Signed)
ECHO per epic 05/12/2004

## 2014-11-12 NOTE — Telephone Encounter (Addendum)
-----   Message from Vivi BarrackMatthew R Wagoner, DPM sent at 11/11/2014  7:51 PM EDT ----- Can you please order an MRI of the right ankle to rule out achilles tendon tear? She can have this done in [place]. Thanks.  [facility]

## 2014-11-15 ENCOUNTER — Ambulatory Visit: Payer: Self-pay | Admitting: Orthopedic Surgery

## 2014-11-15 ENCOUNTER — Telehealth: Payer: Self-pay | Admitting: *Deleted

## 2014-11-15 NOTE — H&P (Signed)
Taylor Hurst is an 49 y.o. female.   Chief Complaint: chronic R shoulder pain HPI: The patient is a 49 year old female who presents today for follow up of their shoulder. The patient is being followed for their right shoulder rotator cuff tear and impingement. They are year(s) out from when symptoms began. Symptoms reported today include: pain. Current treatment includes: activity modification and pain medications. The following medication has been used for pain control: Ultram. The patient presents today following MRI.  Taylor Hurst follows up for her right shoulder. She is here to review her new MRI. Her last MRI was done nearly a year and a half ago, which did show a retracted rotator cuff tear. Symptoms have been ongoing for years at this point. She reports no significant change since her last visit. She is right handed. She is modified her activities that she cannot work. She is taking Ultram as needed for pain, which gives her some relief. She cannot take Percocet. She has an adverse reaction to it. She has difficulty breathing when she took that before. She believes she has taken Norco in the past after a dental procedure without any issues. Ongoing weakness and pain interfering with ADLs.  Past Medical History  Diagnosis Date  . Nausea   . Hypertension   . Arthritis   . Chronic cholecystitis 05/30/2011  . Achilles tendinitis 09/23/2014    Right leg   . Torn rotator cuff 09/23/2014    Right side   . Heart murmur   . GERD (gastroesophageal reflux disease)     hx of pt currently has no issues since gallbladder surgery   . Hepatitis     2013 chronic active hepatitis during gallbladder issues  . History of blood transfusion     19983  . Osteomyelitis (HCC)     1983 right leg;1988 left leg    Past Surgical History  Procedure Laterality Date  . Osteomilitis      right and left leg/surgery; right 1983; left 1988  . Osteomylitis  1610,9604    right leg, left leg  . Intraoperative  cholangiogram  07/26/2011    Procedure: INTRAOPERATIVE CHOLANGIOGRAM;  Surgeon: Ardeth Sportsman, MD;  Location: [facility];  Service: General;  Laterality: N/A;  . Liver biopsy  07/26/2011    Procedure: LIVER BIOPSY;  Surgeon: Ardeth Sportsman, MD;  Location: [facility];  Service: General;  Laterality: N/A;  core liver biopsy   . Cholecystectomy  07/26/11    Family History  Problem Relation Age of Onset  . COPD Mother   . Hypertension Mother   . Hyperlipidemia Mother   . Alcohol abuse Father   . Cancer Maternal Aunt     breast   Social History:  reports that she has never smoked. She has never used smokeless tobacco. She reports that she drinks alcohol. She reports that she does not use illicit drugs.  Allergies:  Allergies  Allergen Reactions  . Percocet [Oxycodone-Acetaminophen] Anaphylaxis    bronchospasms  . Adhesive [Tape] Itching  . Betadine  [Povidone Iodine]     blisters  . Other     Betadine=blisters skin  ,Cashew Nuts,sunflower seeds-swelling and itching  . Penicillins Hives    Has patient had a PCN reaction causing immediate rash, facial/tongue/throat swelling, SOB or lightheadedness with hypotension: no Has patient had a PCN reaction causing severe rash involving mucus membranes or skin necrosis: no Has patient had a PCN reaction that required hospitalization happened in the hospital Has  patient had a PCN reaction occurring within the last 10 years: no If all of the above answers are "NO", then may proceed with Cephalosporin use.   . Benadryl [Diphenhydramine Hcl] Rash     (Not in a hospital admission)  No results found for this or any previous visit (from the past 48 hour(s)). No results found.  Review of Systems  Constitutional: Negative.   HENT: Negative.   Eyes: Negative.   Respiratory: Negative.   Cardiovascular: Negative.   Gastrointestinal: Negative.   Genitourinary: Negative.   Musculoskeletal: Positive for joint pain.  Skin: Negative.   Neurological:  Negative.     There were no vitals taken for this visit. Physical Exam  Constitutional: She is oriented to person, place, and time.  HENT:  Head: Normocephalic.  Eyes: Pupils are equal, round, and reactive to light.  Neck: Normal range of motion.  Cardiovascular: Normal rate.   Respiratory: Effort normal.  GI: Soft.  Musculoskeletal:  On exam, obese, pleasant, awake, alert, and oriented x3, no acute distress. She is seated comfortably. She is tender to palpation in the right shoulder through the subacromial space and the deltoid. Mild decrease in internal rotation and forward flexion as well as abduction secondary to pain. Positive impingement and secondary impingement and weakness. 4+/5 with external rotation and abduction. Otherwise 5/5 with bicep and triceps internal rotation.  Neurological: She is alert and oriented to person, place, and time. She has normal reflexes.  Skin: Skin is warm and dry.   MRI images and report reviewed today by Dr. Shelle IronBeane with a full thickness tear of the supraspinatus which is retracted 3 cm of the articular fibers and 1.5 cm of the bursal-sided fibers. There is mild-to-moderate supraspinatus muscle atrophy and fatty replacement. This was compared to her prior MRI without significant worsening. It does also appear that she has a medial subluxation of the long head of the biceps tendon from the bicipital groove. The supraspinatus tear does appear to extend posteriorly to the infraspinatus tendon footprint. Mild to moderate AC arthrosis with undersurface spurring.  Assessment/Plan Retracted chronic right rotator cuff tear.  We discussed treatment options with the patient. She has had ongoing severe pain interfering with ADLs, refractory to cortisone injections, physical therapy, activity modifications, pain medications. Discussed at this point the nature of her injury given the fact that this is a chronic tear to retract it. There is muscle atrophy and fatty  replacement. It does still appear to be a repairable tear, but she may not regain full range of motion of this shoulder. The goal will be functional range of motion and strength without pain. She will need to restrict her activity long term to avoid future injuring. At this point, after discussion, we will proceed with scheduling her for right shoulder mini-open rotator cuff repair, subacromial decompression, and biceps tenodesis versus repair. We discussed the procedure itself as well as the risks, complications, and alternatives including but not limited to DVT, PE, infection, bleeding, failure of procedure, need for secondary procedure, anesthesia risks, even death. Discussed again the possibility that she may not regain full motion of this arm in three to five months until she is at [facility]. We discussed disability/FMLA. Also discussed time off of work phases for physical therapy, need for activity restrictions. She does essentially type at work, so it would be able to return fairly early. I am warning she is not lifting, pushing, and pulling. She will continue with Ultram in the interim for pain. We will  plan to use Norco postoperatively for her pain. We will proceed accordingly. The patient was seen in conjunction with Dr. Beane today.  Plan right shoulder mini-open RCR/SAD, biceps tenodesis vs. repair  Lani Havlik M. PA-C for Dr. Beane 11/15/2014, 8:43 AM    

## 2014-11-15 NOTE — Telephone Encounter (Signed)
[facility] prior authorized MRI [id number] of right lower extremity, for dx M76.61 - Case Id: [id number], Authorization ID:  [id number] valid to 02/10/2015.  [facility].

## 2014-11-18 ENCOUNTER — Encounter (HOSPITAL_COMMUNITY): Payer: Self-pay | Admitting: *Deleted

## 2014-11-18 ENCOUNTER — Ambulatory Visit (HOSPITAL_COMMUNITY): Payer: Managed Care, Other (non HMO) | Admitting: Anesthesiology

## 2014-11-18 ENCOUNTER — Encounter (HOSPITAL_COMMUNITY)
Admission: RE | Disposition: A | Discharge status: Home / Self Care | Payer: Self-pay | Source: Ambulatory Visit | Attending: Specialist

## 2014-11-18 ENCOUNTER — Ambulatory Visit (HOSPITAL_COMMUNITY)
Admission: RE | Admit: 2014-11-18 | Discharge: 2014-11-19 | Disposition: A | Discharge status: Home / Self Care | Payer: Managed Care, Other (non HMO) | Source: Ambulatory Visit | Attending: Specialist | Admitting: Specialist

## 2014-11-18 DIAGNOSIS — Z885 Allergy status to narcotic agent status: Secondary | ICD-10-CM | POA: Diagnosis not present

## 2014-11-18 DIAGNOSIS — K219 Gastro-esophageal reflux disease without esophagitis: Secondary | ICD-10-CM | POA: Insufficient documentation

## 2014-11-18 DIAGNOSIS — I10 Essential (primary) hypertension: Secondary | ICD-10-CM | POA: Insufficient documentation

## 2014-11-18 DIAGNOSIS — M1990 Unspecified osteoarthritis, unspecified site: Secondary | ICD-10-CM | POA: Insufficient documentation

## 2014-11-18 DIAGNOSIS — Z880 Allergy status to penicillin: Secondary | ICD-10-CM | POA: Insufficient documentation

## 2014-11-18 DIAGNOSIS — Z6841 Body Mass Index (BMI) 40.0 and over, adult: Secondary | ICD-10-CM | POA: Diagnosis not present

## 2014-11-18 DIAGNOSIS — Z888 Allergy status to other drugs, medicaments and biological substances status: Secondary | ICD-10-CM | POA: Insufficient documentation

## 2014-11-18 DIAGNOSIS — M75120 Complete rotator cuff tear or rupture of unspecified shoulder, not specified as traumatic: Secondary | ICD-10-CM | POA: Diagnosis present

## 2014-11-18 DIAGNOSIS — K739 Chronic hepatitis, unspecified: Secondary | ICD-10-CM | POA: Diagnosis not present

## 2014-11-18 DIAGNOSIS — M75101 Unspecified rotator cuff tear or rupture of right shoulder, not specified as traumatic: Secondary | ICD-10-CM | POA: Diagnosis present

## 2014-11-18 DIAGNOSIS — M7541 Impingement syndrome of right shoulder: Secondary | ICD-10-CM | POA: Insufficient documentation

## 2014-11-18 DIAGNOSIS — M75121 Complete rotator cuff tear or rupture of right shoulder, not specified as traumatic: Secondary | ICD-10-CM | POA: Diagnosis not present

## 2014-11-18 HISTORY — PX: SHOULDER OPEN ROTATOR CUFF REPAIR: SHX2407

## 2014-11-18 SURGERY — REPAIR, ROTATOR CUFF, OPEN
Anesthesia: General | Site: Shoulder | Laterality: Right

## 2014-11-18 MED ORDER — SODIUM CHLORIDE 0.9 % IV SOLN
10.0000 mg | INTRAVENOUS | Status: DC | PRN
  Administered 2014-11-18: 25 ug/min via INTRAVENOUS

## 2014-11-18 MED ORDER — METOCLOPRAMIDE HCL 5 MG/ML IJ SOLN: 5.0000 mg | Freq: Three times a day (TID) | INTRAMUSCULAR | Status: DC | PRN

## 2014-11-18 MED ORDER — SENNOSIDES-DOCUSATE SODIUM 8.6-50 MG PO TABS: 1.0000 | ORAL_TABLET | Freq: Every evening | ORAL | Status: DC | PRN

## 2014-11-18 MED ORDER — LIDOCAINE HCL (CARDIAC) 20 MG/ML IV SOLN
INTRAVENOUS | Status: DC | PRN
  Administered 2014-11-18: 100 mg via INTRAVENOUS

## 2014-11-18 MED ORDER — HYDROMORPHONE HCL 1 MG/ML IJ SOLN: 1.0000 mg | INTRAMUSCULAR | Status: DC | PRN

## 2014-11-18 MED ORDER — KETOROLAC TROMETHAMINE 15 MG/ML IJ SOLN
15.0000 mg | Freq: Four times a day (QID) | INTRAMUSCULAR | Status: DC
  Administered 2014-11-18 – 2014-11-19 (×3): 15 mg via INTRAVENOUS
  Filled 2014-11-18 (×3): qty 1

## 2014-11-18 MED ORDER — ACETAMINOPHEN 650 MG RE SUPP: 650.0000 mg | Freq: Four times a day (QID) | RECTAL | Status: DC | PRN

## 2014-11-18 MED ORDER — LIDOCAINE HCL (CARDIAC) 20 MG/ML IV SOLN
INTRAVENOUS | Status: AC
  Filled 2014-11-18: qty 5

## 2014-11-18 MED ORDER — MAGNESIUM CITRATE PO SOLN: 1.0000 | Freq: Once | ORAL | Status: DC | PRN

## 2014-11-18 MED ORDER — SUFENTANIL CITRATE 50 MCG/ML IV SOLN
INTRAVENOUS | Status: DC | PRN
  Administered 2014-11-18: 20 ug via INTRAVENOUS

## 2014-11-18 MED ORDER — LACTATED RINGERS IV SOLN: INTRAVENOUS | Status: DC

## 2014-11-18 MED ORDER — AMLODIPINE BESYLATE 10 MG PO TABS
10.0000 mg | ORAL_TABLET | Freq: Every day | ORAL | Status: DC
  Administered 2014-11-19: 10 mg via ORAL
  Filled 2014-11-18 (×2): qty 1

## 2014-11-18 MED ORDER — SUFENTANIL CITRATE 50 MCG/ML IV SOLN
INTRAVENOUS | Status: AC
  Filled 2014-11-18: qty 1

## 2014-11-18 MED ORDER — BISACODYL 5 MG PO TBEC: 5.0000 mg | DELAYED_RELEASE_TABLET | Freq: Every day | ORAL | Status: DC | PRN

## 2014-11-18 MED ORDER — GLYCOPYRROLATE 0.2 MG/ML IJ SOLN
INTRAMUSCULAR | Status: DC | PRN
  Administered 2014-11-18: 0.6 mg via INTRAVENOUS

## 2014-11-18 MED ORDER — LACTATED RINGERS IV SOLN
INTRAVENOUS | Status: DC
  Administered 2014-11-18: 1000 mL via INTRAVENOUS

## 2014-11-18 MED ORDER — PHENOL 1.4 % MT LIQD
1.0000 | OROMUCOSAL | Status: DC | PRN
Start: 2014-11-18 — End: 2014-11-19

## 2014-11-18 MED ORDER — ROPIVACAINE HCL 5 MG/ML IJ SOLN
INTRAMUSCULAR | Status: DC | PRN
  Administered 2014-11-18: 30 mL via PERINEURAL

## 2014-11-18 MED ORDER — ONDANSETRON HCL 4 MG/2ML IJ SOLN
INTRAMUSCULAR | Status: DC | PRN
  Administered 2014-11-18: 4 mg via INTRAVENOUS

## 2014-11-18 MED ORDER — VALSARTAN-HYDROCHLOROTHIAZIDE 320-25 MG PO TABS: 1.0000 | ORAL_TABLET | Freq: Every day | ORAL | Status: DC

## 2014-11-18 MED ORDER — HYDROCODONE-ACETAMINOPHEN 10-325 MG PO TABS: 1.0000 | ORAL_TABLET | Freq: Four times a day (QID) | ORAL | Status: DC | PRN

## 2014-11-18 MED ORDER — BUPIVACAINE-EPINEPHRINE 0.5% -1:200000 IJ SOLN
INTRAMUSCULAR | Status: DC | PRN
  Administered 2014-11-18: 17 mL
  Administered 2014-11-18: 3 mL

## 2014-11-18 MED ORDER — SUCCINYLCHOLINE CHLORIDE 20 MG/ML IJ SOLN
INTRAMUSCULAR | Status: DC | PRN
  Administered 2014-11-18: 100 mg via INTRAVENOUS

## 2014-11-18 MED ORDER — KCL IN DEXTROSE-NACL 20-5-0.45 MEQ/L-%-% IV SOLN
INTRAVENOUS | Status: AC
  Administered 2014-11-18: 50 mL/h via INTRAVENOUS
  Filled 2014-11-18 (×2): qty 1000

## 2014-11-18 MED ORDER — CLINDAMYCIN PHOSPHATE 900 MG/50ML IV SOLN
900.0000 mg | INTRAVENOUS | Status: AC
  Administered 2014-11-18: 900 mg via INTRAVENOUS

## 2014-11-18 MED ORDER — NEOSTIGMINE METHYLSULFATE 10 MG/10ML IV SOLN
INTRAVENOUS | Status: AC
  Filled 2014-11-18: qty 1

## 2014-11-18 MED ORDER — MENTHOL 3 MG MT LOZG: 1.0000 | LOZENGE | OROMUCOSAL | Status: DC | PRN

## 2014-11-18 MED ORDER — FENTANYL CITRATE (PF) 100 MCG/2ML IJ SOLN
INTRAMUSCULAR | Status: AC
  Filled 2014-11-18: qty 2

## 2014-11-18 MED ORDER — GLYCOPYRROLATE 0.2 MG/ML IJ SOLN
INTRAMUSCULAR | Status: AC
  Filled 2014-11-18: qty 3

## 2014-11-18 MED ORDER — ENOXAPARIN SODIUM 40 MG/0.4ML ~~LOC~~ SOLN
40.0000 mg | SUBCUTANEOUS | Status: DC
  Administered 2014-11-19: 40 mg via SUBCUTANEOUS
  Filled 2014-11-18 (×2): qty 0.4

## 2014-11-18 MED ORDER — SODIUM CHLORIDE 0.9 % IR SOLN
Status: DC | PRN
  Administered 2014-11-18: 500 mL

## 2014-11-18 MED ORDER — IRBESARTAN 300 MG PO TABS
300.0000 mg | ORAL_TABLET | Freq: Every day | ORAL | Status: DC
  Administered 2014-11-19: 300 mg via ORAL
  Filled 2014-11-18: qty 1

## 2014-11-18 MED ORDER — EPHEDRINE SULFATE 50 MG/ML IJ SOLN
INTRAMUSCULAR | Status: DC | PRN
  Administered 2014-11-18 (×2): 10 mg via INTRAVENOUS

## 2014-11-18 MED ORDER — METHOCARBAMOL 500 MG PO TABS
500.0000 mg | ORAL_TABLET | Freq: Four times a day (QID) | ORAL | Status: DC | PRN
  Administered 2014-11-19: 500 mg via ORAL
  Filled 2014-11-18: qty 1

## 2014-11-18 MED ORDER — ONDANSETRON HCL 4 MG/2ML IJ SOLN: 4.0000 mg | Freq: Four times a day (QID) | INTRAMUSCULAR | Status: DC | PRN

## 2014-11-18 MED ORDER — CLINDAMYCIN PHOSPHATE 900 MG/50ML IV SOLN
INTRAVENOUS | Status: AC
  Filled 2014-11-18: qty 50

## 2014-11-18 MED ORDER — METHOCARBAMOL 500 MG PO TABS: 500.0000 mg | ORAL_TABLET | Freq: Three times a day (TID) | ORAL | Status: DC

## 2014-11-18 MED ORDER — HYDROMORPHONE HCL 1 MG/ML IJ SOLN: 0.2500 mg | INTRAMUSCULAR | Status: DC | PRN

## 2014-11-18 MED ORDER — NEOSTIGMINE METHYLSULFATE 10 MG/10ML IV SOLN
INTRAVENOUS | Status: DC | PRN
  Administered 2014-11-18: 4 mg via INTRAVENOUS

## 2014-11-18 MED ORDER — DOCUSATE SODIUM 100 MG PO CAPS: 100.0000 mg | ORAL_CAPSULE | Freq: Two times a day (BID) | ORAL | Status: DC | PRN

## 2014-11-18 MED ORDER — TRAMADOL HCL 50 MG PO TABS: 50.0000 mg | ORAL_TABLET | Freq: Every evening | ORAL | Status: DC | PRN

## 2014-11-18 MED ORDER — FENTANYL CITRATE (PF) 100 MCG/2ML IJ SOLN
50.0000 ug | INTRAMUSCULAR | Status: AC | PRN
Start: 2014-11-18 — End: 2014-11-18
  Administered 2014-11-18: 50 ug via INTRAVENOUS

## 2014-11-18 MED ORDER — ROPIVACAINE HCL 5 MG/ML IJ SOLN
INTRAMUSCULAR | Status: AC
  Filled 2014-11-18: qty 30

## 2014-11-18 MED ORDER — DOCUSATE SODIUM 100 MG PO CAPS
100.0000 mg | ORAL_CAPSULE | Freq: Two times a day (BID) | ORAL | Status: DC
  Administered 2014-11-18 – 2014-11-19 (×2): 100 mg via ORAL

## 2014-11-18 MED ORDER — ONDANSETRON HCL 4 MG PO TABS: 4.0000 mg | ORAL_TABLET | Freq: Four times a day (QID) | ORAL | Status: DC | PRN

## 2014-11-18 MED ORDER — HYDROCODONE-ACETAMINOPHEN 10-325 MG PO TABS
1.0000 | ORAL_TABLET | ORAL | Status: DC | PRN
  Administered 2014-11-19: 1 via ORAL
  Filled 2014-11-18: qty 1

## 2014-11-18 MED ORDER — BUPIVACAINE-EPINEPHRINE (PF) 0.5% -1:200000 IJ SOLN
INTRAMUSCULAR | Status: AC
  Filled 2014-11-18: qty 30

## 2014-11-18 MED ORDER — PROPOFOL 10 MG/ML IV BOLUS
INTRAVENOUS | Status: DC | PRN
  Administered 2014-11-18: 150 mg via INTRAVENOUS

## 2014-11-18 MED ORDER — DEXAMETHASONE SODIUM PHOSPHATE 10 MG/ML IJ SOLN
INTRAMUSCULAR | Status: DC | PRN
  Administered 2014-11-18: 10 mg via INTRAVENOUS

## 2014-11-18 MED ORDER — MIDAZOLAM HCL 2 MG/2ML IJ SOLN
INTRAMUSCULAR | Status: AC
  Filled 2014-11-18: qty 2

## 2014-11-18 MED ORDER — ONDANSETRON HCL 4 MG/2ML IJ SOLN
INTRAMUSCULAR | Status: AC
  Filled 2014-11-18: qty 2

## 2014-11-18 MED ORDER — ACETAMINOPHEN 325 MG PO TABS: 650.0000 mg | ORAL_TABLET | Freq: Four times a day (QID) | ORAL | Status: DC | PRN

## 2014-11-18 MED ORDER — PROPOFOL 10 MG/ML IV BOLUS
INTRAVENOUS | Status: AC
  Filled 2014-11-18: qty 20

## 2014-11-18 MED ORDER — CALCIUM POLYCARBOPHIL 625 MG PO TABS
625.0000 mg | ORAL_TABLET | Freq: Two times a day (BID) | ORAL | Status: DC
  Administered 2014-11-18 – 2014-11-19 (×2): 625 mg via ORAL
  Filled 2014-11-18 (×3): qty 1

## 2014-11-18 MED ORDER — METHOCARBAMOL 1000 MG/10ML IJ SOLN
500.0000 mg | Freq: Four times a day (QID) | INTRAVENOUS | Status: DC | PRN
  Filled 2014-11-18: qty 5

## 2014-11-18 MED ORDER — PHENYLEPHRINE HCL 10 MG/ML IJ SOLN: 10.0000 mg | INTRAMUSCULAR | Status: DC | PRN

## 2014-11-18 MED ORDER — CEFAZOLIN SODIUM-DEXTROSE 2-3 GM-% IV SOLR
2.0000 g | Freq: Four times a day (QID) | INTRAVENOUS | Status: AC
  Administered 2014-11-18 – 2014-11-19 (×3): 2 g via INTRAVENOUS
  Filled 2014-11-18 (×3): qty 50

## 2014-11-18 MED ORDER — HYDROCHLOROTHIAZIDE 25 MG PO TABS
25.0000 mg | ORAL_TABLET | Freq: Every day | ORAL | Status: DC
  Administered 2014-11-19: 25 mg via ORAL
  Filled 2014-11-18: qty 1

## 2014-11-18 MED ORDER — POLYMYXIN B SULFATE 500000 UNITS IJ SOLR
INTRAMUSCULAR | Status: AC
  Filled 2014-11-18: qty 1

## 2014-11-18 MED ORDER — METOCLOPRAMIDE HCL 10 MG PO TABS: 5.0000 mg | ORAL_TABLET | Freq: Three times a day (TID) | ORAL | Status: DC | PRN

## 2014-11-18 MED ORDER — MELATONIN 5 MG PO CAPS: 10.0000 mg | ORAL_CAPSULE | Freq: Every day | ORAL | Status: DC

## 2014-11-18 MED ORDER — TAVABOROLE 5 % EX SOLN: 1.0000 [drp] | CUTANEOUS | Status: DC

## 2014-11-18 MED ORDER — OLOPATADINE HCL 0.1 % OP SOLN
1.0000 [drp] | Freq: Two times a day (BID) | OPHTHALMIC | Status: DC
  Filled 2014-11-18: qty 5

## 2014-11-18 MED ORDER — CISATRACURIUM BESYLATE (PF) 10 MG/5ML IV SOLN
INTRAVENOUS | Status: DC | PRN
  Administered 2014-11-18: 6 mg via INTRAVENOUS

## 2014-11-18 MED ORDER — CISATRACURIUM BESYLATE 20 MG/10ML IV SOLN
INTRAVENOUS | Status: AC
  Filled 2014-11-18: qty 10

## 2014-11-18 MED ORDER — MIDAZOLAM HCL 2 MG/2ML IJ SOLN
1.0000 mg | INTRAMUSCULAR | Status: DC | PRN
  Administered 2014-11-18: 2 mg via INTRAVENOUS

## 2014-11-18 MED ORDER — RISAQUAD PO CAPS
1.0000 | ORAL_CAPSULE | Freq: Every day | ORAL | Status: DC
  Administered 2014-11-19: 1 via ORAL
  Filled 2014-11-18 (×2): qty 1

## 2014-11-18 MED ORDER — DEXAMETHASONE SODIUM PHOSPHATE 10 MG/ML IJ SOLN
INTRAMUSCULAR | Status: AC
  Filled 2014-11-18: qty 1

## 2014-11-18 SURGICAL SUPPLY — 49 items
ANCH SUT SWLK 19.1X4.75 VT (Anchor) ×3 IMPLANT
ANCHOR NDL 9/16 CIR SZ 8 (NEEDLE) IMPLANT
ANCHOR NEEDLE 9/16 CIR SZ 8 (NEEDLE) IMPLANT
ANCHOR PEEK 4.75X19.1 SWLK C (Anchor) ×5 IMPLANT
BAG SPEC THK2 15X12 ZIP CLS (MISCELLANEOUS)
BAG ZIPLOCK 12X15 (MISCELLANEOUS) IMPLANT
BLADE OSCILLATING/SAGITTAL (BLADE)
BLADE SW THK.38XMED LNG THN (BLADE) IMPLANT
CLOTH 2% CHLOROHEXIDINE 3PK (PERSONAL CARE ITEMS) ×2 IMPLANT
DRAPE POUCH INSTRU U-SHP 10X18 (DRAPES) ×2 IMPLANT
DRSG AQUACEL AG ADV 3.5X 4 (GAUZE/BANDAGES/DRESSINGS) ×1 IMPLANT
DRSG AQUACEL AG ADV 3.5X 6 (GAUZE/BANDAGES/DRESSINGS) IMPLANT
ELECT NDL TIP 2.8 STRL (NEEDLE) ×1 IMPLANT
ELECT NEEDLE TIP 2.8 STRL (NEEDLE) ×2 IMPLANT
ELECT REM PT RETURN 9FT ADLT (ELECTROSURGICAL) ×2
ELECTRODE REM PT RTRN 9FT ADLT (ELECTROSURGICAL) ×1 IMPLANT
GLOVE BIOGEL PI IND STRL 7.0 (GLOVE) ×1 IMPLANT
GLOVE BIOGEL PI IND STRL 8 (GLOVE) ×1 IMPLANT
GLOVE BIOGEL PI INDICATOR 7.0 (GLOVE) ×1
GLOVE BIOGEL PI INDICATOR 8 (GLOVE) ×1
GLOVE SURG SS PI 7.0 STRL IVOR (GLOVE) ×2 IMPLANT
GLOVE SURG SS PI 7.5 STRL IVOR (GLOVE) ×2 IMPLANT
GLOVE SURG SS PI 8.0 STRL IVOR (GLOVE) ×2 IMPLANT
GOWN STRL REUS W/TWL XL LVL3 (GOWN DISPOSABLE) ×4 IMPLANT
KIT BASIN OR (CUSTOM PROCEDURE TRAY) ×2 IMPLANT
KIT POSITION SHOULDER SCHLEI (MISCELLANEOUS) ×2 IMPLANT
MANIFOLD NEPTUNE II (INSTRUMENTS) ×2 IMPLANT
NDL SCORPION MULTI FIRE (NEEDLE) IMPLANT
NEEDLE SCORPION MULTI FIRE (NEEDLE) ×2 IMPLANT
PACK SHOULDER (CUSTOM PROCEDURE TRAY) ×2 IMPLANT
PEN SKIN MARKING BROAD (MISCELLANEOUS) ×2 IMPLANT
POSITIONER SURGICAL ARM (MISCELLANEOUS) ×2 IMPLANT
SLING ARM IMMOBILIZER LRG (SOFTGOODS) IMPLANT
SLING ULTRA II L (ORTHOPEDIC SUPPLIES) IMPLANT
STRIP CLOSURE SKIN 1/2X4 (GAUZE/BANDAGES/DRESSINGS) ×1 IMPLANT
STRIP CLOSURE SKIN 1/4X4 (GAUZE/BANDAGES/DRESSINGS) ×1 IMPLANT
SUT BONE WAX W31G (SUTURE) IMPLANT
SUT ETHIBOND NAB CT1 #1 30IN (SUTURE) IMPLANT
SUT FIBERWIRE #2 38 T-5 BLUE (SUTURE)
SUT PROLENE 3 0 PS 2 (SUTURE) ×2 IMPLANT
SUT TIGER TAPE 7 IN WHITE (SUTURE) ×1 IMPLANT
SUT VIC AB 1-0 CT2 27 (SUTURE) ×2 IMPLANT
SUT VIC AB 2-0 CT2 27 (SUTURE) ×2 IMPLANT
SUT VICRYL 0-0 OS 2 NEEDLE (SUTURE) IMPLANT
SUTURE FIBERWR #2 38 T-5 BLUE (SUTURE) IMPLANT
TAPE FIBER 2MM 7IN #2 BLUE (SUTURE) ×1 IMPLANT
TOWEL OR 17X26 10 PK STRL BLUE (TOWEL DISPOSABLE) ×2 IMPLANT
TOWEL OR NON WOVEN STRL DISP B (DISPOSABLE) ×2 IMPLANT
YANKAUER SUCT BULB TIP NO VENT (SUCTIONS) ×2 IMPLANT

## 2014-11-18 NOTE — Discharge Instructions (Signed)
Aquacel dressing may remain in place until follow up. May shower with aquacel dressing in place. If the dressing becomes saturated or peels off, you may remove it and place a new dressing with gauze and tape which should be kept clean and dry and changed daily. °Use sling at times except when exercising or showering °No driving for 4-6 weeks °No lifting for 6 weeks operative arm °Pendulum exercises as instructed. °Ok to move wrist, elbow, and hand. °See Dr. Lydon Vansickle in 10-14 days. Take one aspirin per day with a meal if not on a blood thinner or allergic to aspirin. °

## 2014-11-18 NOTE — Anesthesia Postprocedure Evaluation (Signed)
  Anesthesia Post-op Note  Patient: Taylor GibsonJustina F Hurst  Procedure(s) Performed: Procedure(s) (LRB): RIGHT SHOULDER MINI OPEN ROTATOR CUFF REPAIR SUBACROMIAL DECOMPRESSION   (Right)  Patient Location: PACU  Anesthesia Type: GA combined with regional for post-op pain  Level of Consciousness: awake and alert   Airway and Oxygen Therapy: Patient Spontanous Breathing  Post-op Pain: mild  Post-op Assessment: Post-op Vital signs reviewed, Patient's Cardiovascular Status Stable, Respiratory Function Stable, Patent Airway and No signs of Nausea or vomiting  Last Vitals:  Filed Vitals:   11/18/14 1406  BP: 111/57  Pulse: 74  Temp: 36.6 C  Resp: 16    Post-op Vital Signs: stable   Complications: No apparent anesthesia complications

## 2014-11-18 NOTE — Progress Notes (Signed)
PHARMACIST - PHYSICIAN ORDER COMMUNICATION  CONCERNING: P&T Medication Policy on Herbal Medications  DESCRIPTION:  This patient's order for:  melatonin  has been noted.  This product(s) is classified as an "herbal" or natural product. Due to a lack of definitive safety studies or FDA approval, nonstandard manufacturing practices, plus the potential risk of unknown drug-drug interactions while on inpatient medications, the Pharmacy and Therapeutics Committee does not permit the use of "herbal" or natural products of this type within [facility].   ACTION TAKEN: The pharmacy department is unable to verify this order at this time and your patient has been informed of this safety policy. Please reevaluate patient's clinical condition at discharge and address if the herbal or natural product(s) should be resumed at that time.  Arley PhenixEllen Eran Windish RPh 11/18/2014, 1:36 PM Pager [phone]

## 2014-11-18 NOTE — Anesthesia Preprocedure Evaluation (Signed)
Anesthesia Evaluation  Patient identified by MRN, date of birth, ID band Patient awake    Reviewed: Allergy & Precautions, H&P , NPO status , Patient's Chart, lab work & pertinent test results, reviewed documented beta blocker date and time   Airway Mallampati: II  TM Distance: >3 FB Neck ROM: full    Dental no notable dental hx. (+) Dental Advisory Given   Pulmonary neg pulmonary ROS,    Pulmonary exam normal breath sounds clear to auscultation       Cardiovascular Exercise Tolerance: Good hypertension, Pt. on medications Normal cardiovascular exam Rhythm:regular Rate:Normal     Neuro/Psych negative neurological ROS  negative psych ROS   GI/Hepatic negative GI ROS, Neg liver ROS, GERD  Medicated and Controlled,(+) Hepatitis -, UnspecifiedChronic active hepatitis   Endo/Other  negative endocrine ROSMorbid obesity  Renal/GU negative Renal ROS  negative genitourinary   Musculoskeletal   Abdominal (+) + obese,   Peds  Hematology negative hematology ROS (+)   Anesthesia Other Findings   Reproductive/Obstetrics negative OB ROS                             Anesthesia Physical Anesthesia Plan  ASA: III  Anesthesia Plan: General   Post-op Pain Management: GA combined w/ Regional for post-op pain   Induction: Intravenous  Airway Management Planned: Oral ETT  Additional Equipment:   Intra-op Plan:   Post-operative Plan: Extubation in OR  Informed Consent: I have reviewed the patients History and Physical, chart, labs and discussed the procedure including the risks, benefits and alternatives for the proposed anesthesia with the patient or authorized representative who has indicated his/her understanding and acceptance.   Dental Advisory Given  Plan Discussed with: CRNA and Surgeon  Anesthesia Plan Comments:         Anesthesia Quick Evaluation

## 2014-11-18 NOTE — Brief Op Note (Signed)
11/18/2014  11:41 AM  PATIENT:  Taylor Hurst  49 y.o. female  PRE-OPERATIVE DIAGNOSIS:  RIGHT SHOULDER ROTATOR CUFF TEAR  POST-OPERATIVE DIAGNOSIS:  RIGHT SHOULDER ROTATOR CUFF TEAR  PROCEDURE:  Procedure(s): RIGHT SHOULDER MINI OPEN ROTATOR CUFF REPAIR SUBACROMIAL DECOMPRESSION   (Right)  SURGEON:  Surgeon(s) and Role:    * Jene EveryJeffrey Kennis Buell, MD - Primary  PHYSICIAN ASSISTANT:   ASSISTANTS: Bissell   ANESTHESIA:   general  EBL:     BLOOD ADMINISTERED:none  DRAINS: none   LOCAL MEDICATIONS USED:  MARCAINE     SPECIMEN:  No Specimen  DISPOSITION OF SPECIMEN:  N/A  COUNTS:  YES  TOURNIQUET:  * No tourniquets in log *  DICTATION: .Other Dictation: Dictation Number [id number]  PLAN OF CARE: Admit for overnight observation  PATIENT DISPOSITION:  PACU - hemodynamically stable.   Delay start of Pharmacological VTE agent (>24hrs) due to surgical blood loss or risk of bleeding: no

## 2014-11-18 NOTE — Interval H&P Note (Signed)
History and Physical Interval Note:  11/18/2014 7:28 AM  Taylor Hurst  has presented today for surgery, with the diagnosis of RIGHT SHOULDER ROTATOR CUFF TEAR  The various methods of treatment have been discussed with the patient and family. After consideration of risks, benefits and other options for treatment, the patient has consented to  Procedure(s): RIGHT SHOULDER MINI OPEN ROTATOR CUFF REPAIR SUBACROMIAL DECOMPRESSION  BICEP TENODESIS VERSES REPAIR (Right) as a surgical intervention .  The patient's history has been reviewed, patient examined, no change in status, stable for surgery.  I have reviewed the patient's chart and labs.  Questions were answered to the patient's satisfaction.     Lalah Durango C

## 2014-11-18 NOTE — Transfer of Care (Signed)
Immediate Anesthesia Transfer of Care Note  Patient: Taylor GibsonJustina F Hurst  Procedure(s) Performed: Procedure(s): RIGHT SHOULDER MINI OPEN ROTATOR CUFF REPAIR SUBACROMIAL DECOMPRESSION   (Right)  Patient Location: PACU  Anesthesia Type:General and Regional  Level of Consciousness: awake, alert  and oriented  Airway & Oxygen Therapy: Patient Spontanous Breathing and Patient connected to face mask oxygen  Post-op Assessment: Report given to RN and Post -op Vital signs reviewed and stable  Post vital signs: Reviewed and stable  Last Vitals:  Filed Vitals:   11/18/14 1025  BP:   Pulse: 65  Temp:   Resp: 13    Complications: No apparent anesthesia complications

## 2014-11-18 NOTE — H&P (View-Only) (Signed)
Taylor Hurst is an 49 y.o. female.   Chief Complaint: chronic R shoulder pain HPI: The patient is a 49 year old female who presents today for follow up of their shoulder. The patient is being followed for their right shoulder rotator cuff tear and impingement. They are year(s) out from when symptoms began. Symptoms reported today include: pain. Current treatment includes: activity modification and pain medications. The following medication has been used for pain control: Ultram. The patient presents today following MRI.  Taylor Hurst follows up for her right shoulder. She is here to review her new MRI. Her last MRI was done nearly a year and a half ago, which did show a retracted rotator cuff tear. Symptoms have been ongoing for years at this point. She reports no significant change since her last visit. She is right handed. She is modified her activities that she cannot work. She is taking Ultram as needed for pain, which gives her some relief. She cannot take Percocet. She has an adverse reaction to it. She has difficulty breathing when she took that before. She believes she has taken Norco in the past after a dental procedure without any issues. Ongoing weakness and pain interfering with ADLs.  Past Medical History  Diagnosis Date  . Nausea   . Hypertension   . Arthritis   . Chronic cholecystitis 05/30/2011  . Achilles tendinitis 09/23/2014    Right leg   . Torn rotator cuff 09/23/2014    Right side   . Heart murmur   . GERD (gastroesophageal reflux disease)     hx of pt currently has no issues since gallbladder surgery   . Hepatitis     2013 chronic active hepatitis during gallbladder issues  . History of blood transfusion     19983  . Osteomyelitis (HCC)     1983 right leg;1988 left leg    Past Surgical History  Procedure Laterality Date  . Osteomilitis      right and left leg/surgery; right 1983; left 1988  . Osteomylitis  1610,9604    right leg, left leg  . Intraoperative  cholangiogram  07/26/2011    Procedure: INTRAOPERATIVE CHOLANGIOGRAM;  Surgeon: Ardeth Sportsman, MD;  Location: [facility];  Service: General;  Laterality: N/A;  . Liver biopsy  07/26/2011    Procedure: LIVER BIOPSY;  Surgeon: Ardeth Sportsman, MD;  Location: [facility];  Service: General;  Laterality: N/A;  core liver biopsy   . Cholecystectomy  07/26/11    Family History  Problem Relation Age of Onset  . COPD Mother   . Hypertension Mother   . Hyperlipidemia Mother   . Alcohol abuse Father   . Cancer Maternal Aunt     breast   Social History:  reports that she has never smoked. She has never used smokeless tobacco. She reports that she drinks alcohol. She reports that she does not use illicit drugs.  Allergies:  Allergies  Allergen Reactions  . Percocet [Oxycodone-Acetaminophen] Anaphylaxis    bronchospasms  . Adhesive [Tape] Itching  . Betadine  [Povidone Iodine]     blisters  . Other     Betadine=blisters skin  ,Cashew Nuts,sunflower seeds-swelling and itching  . Penicillins Hives    Has patient had a PCN reaction causing immediate rash, facial/tongue/throat swelling, SOB or lightheadedness with hypotension: no Has patient had a PCN reaction causing severe rash involving mucus membranes or skin necrosis: no Has patient had a PCN reaction that required hospitalization happened in the hospital Has  patient had a PCN reaction occurring within the last 10 years: no If all of the above answers are "NO", then may proceed with Cephalosporin use.   . Benadryl [Diphenhydramine Hcl] Rash     (Not in a hospital admission)  No results found for this or any previous visit (from the past 48 hour(s)). No results found.  Review of Systems  Constitutional: Negative.   HENT: Negative.   Eyes: Negative.   Respiratory: Negative.   Cardiovascular: Negative.   Gastrointestinal: Negative.   Genitourinary: Negative.   Musculoskeletal: Positive for joint pain.  Skin: Negative.   Neurological:  Negative.     There were no vitals taken for this visit. Physical Exam  Constitutional: She is oriented to person, place, and time.  HENT:  Head: Normocephalic.  Eyes: Pupils are equal, round, and reactive to light.  Neck: Normal range of motion.  Cardiovascular: Normal rate.   Respiratory: Effort normal.  GI: Soft.  Musculoskeletal:  On exam, obese, pleasant, awake, alert, and oriented x3, no acute distress. She is seated comfortably. She is tender to palpation in the right shoulder through the subacromial space and the deltoid. Mild decrease in internal rotation and forward flexion as well as abduction secondary to pain. Positive impingement and secondary impingement and weakness. 4+/5 with external rotation and abduction. Otherwise 5/5 with bicep and triceps internal rotation.  Neurological: She is alert and oriented to person, place, and time. She has normal reflexes.  Skin: Skin is warm and dry.   MRI images and report reviewed today by Dr. Shelle IronBeane with a full thickness tear of the supraspinatus which is retracted 3 cm of the articular fibers and 1.5 cm of the bursal-sided fibers. There is mild-to-moderate supraspinatus muscle atrophy and fatty replacement. This was compared to her prior MRI without significant worsening. It does also appear that she has a medial subluxation of the long head of the biceps tendon from the bicipital groove. The supraspinatus tear does appear to extend posteriorly to the infraspinatus tendon footprint. Mild to moderate AC arthrosis with undersurface spurring.  Assessment/Plan Retracted chronic right rotator cuff tear.  We discussed treatment options with the patient. She has had ongoing severe pain interfering with ADLs, refractory to cortisone injections, physical therapy, activity modifications, pain medications. Discussed at this point the nature of her injury given the fact that this is a chronic tear to retract it. There is muscle atrophy and fatty  replacement. It does still appear to be a repairable tear, but she may not regain full range of motion of this shoulder. The goal will be functional range of motion and strength without pain. She will need to restrict her activity long term to avoid future injuring. At this point, after discussion, we will proceed with scheduling her for right shoulder mini-open rotator cuff repair, subacromial decompression, and biceps tenodesis versus repair. We discussed the procedure itself as well as the risks, complications, and alternatives including but not limited to DVT, PE, infection, bleeding, failure of procedure, need for secondary procedure, anesthesia risks, even death. Discussed again the possibility that she may not regain full motion of this arm in three to five months until she is at [facility]. We discussed disability/FMLA. Also discussed time off of work phases for physical therapy, need for activity restrictions. She does essentially type at work, so it would be able to return fairly early. I am warning she is not lifting, pushing, and pulling. She will continue with Ultram in the interim for pain. We will  plan to use Norco postoperatively for her pain. We will proceed accordingly. The patient was seen in conjunction with Dr. Shelle Iron today.  Plan right shoulder mini-open RCR/SAD, biceps tenodesis vs. repair  Breeona Waid M. PA-C for Dr. Shelle Iron 11/15/2014, 8:43 AM

## 2014-11-18 NOTE — Progress Notes (Signed)
AssistedDr. Leta JunglingEwell with right, interscalene  block. Side rails up, monitors on throughout procedure. See vital signs in flow sheet. Tolerated Procedure well.

## 2014-11-18 NOTE — Anesthesia Procedure Notes (Addendum)
Anesthesia Regional Block:  Interscalene brachial plexus block  Pre-Anesthetic Checklist: ,, timeout performed, Correct Patient, Correct Site, Correct Laterality, Correct Procedure, Correct Position, site marked, Risks and benefits discussed,  Surgical consent,  Pre-op evaluation,  At surgeon's request and post-op pain management  Laterality: Right  Prep: chloraprep       Needles:  Injection technique: Single-shot  Needle Type: Echogenic Needle     Needle Length: 15cm 15 cm Needle Gauge: 21 and 21 G    Additional Needles:  Procedures: ultrasound guided (picture in chart) Interscalene brachial plexus block Narrative:  Start time: 11/18/2014 9:55 AM End time: 11/18/2014 10:05 AM Injection made incrementally with aspirations every 5 mL.  Performed by: Personally  Anesthesiologist: Ronelle NighEWELL, CHARLES  Additional Notes: Patient tolerated the procedure well without complications   Procedure Name: Intubation Date/Time: 11/18/2014 10:46 AM Performed by: Leroy LibmanEARDON, Zilpha Mcandrew L Patient Re-evaluated:Patient Re-evaluated prior to inductionOxygen Delivery Method: Circle system utilized Preoxygenation: Pre-oxygenation with 100% oxygen Intubation Type: IV induction Ventilation: Mask ventilation without difficulty and Oral airway inserted - appropriate to patient size Laryngoscope Size: Hyacinth MeekerMiller and 2 Grade View: Grade I Tube type: Oral Tube size: 7.5 mm Number of attempts: 1 Airway Equipment and Method: Stylet Placement Confirmation: ETT inserted through vocal cords under direct vision,  breath sounds checked- equal and bilateral and positive ETCO2 Secured at: 21 cm Tube secured with: Tape Dental Injury: Teeth and Oropharynx as per pre-operative assessment

## 2014-11-19 DIAGNOSIS — M75121 Complete rotator cuff tear or rupture of right shoulder, not specified as traumatic: Secondary | ICD-10-CM | POA: Diagnosis not present

## 2014-11-19 LAB — BASIC METABOLIC PANEL
Anion gap: 11 (ref 5–15)
BUN: 17 mg/dL (ref 6–20)
CALCIUM: 9.1 mg/dL (ref 8.9–10.3)
CO2: 26 mmol/L (ref 22–32)
CREATININE: 0.93 mg/dL (ref 0.44–1.00)
Chloride: 104 mmol/L (ref 101–111)
GFR calc Af Amer: >60 mL/min (ref >60)
Glucose, Bld: 140 mg/dL — ABNORMAL HIGH (ref 65–99)
POTASSIUM: 3.6 mmol/L (ref 3.5–5.1)
SODIUM: 141 mmol/L (ref 135–145)

## 2014-11-19 LAB — CBC
HCT: 36.4 % (ref 36.0–46.0)
Hemoglobin: 11.7 g/dL — ABNORMAL LOW (ref 12.0–15.0)
MCH: 28.7 pg (ref 26.0–34.0)
MCHC: 32.1 g/dL (ref 30.0–36.0)
MCV: 89.2 fL (ref 78.0–100.0)
PLATELETS: 289 10*3/uL (ref 150–400)
RBC: 4.08 MIL/uL (ref 3.87–5.11)
RDW: 13.4 % (ref 11.5–15.5)
WBC: 12.2 10*3/uL — ABNORMAL HIGH (ref 4.0–10.5)

## 2014-11-19 NOTE — Progress Notes (Signed)
   11/19/14 1200  OT Visit Information  Last OT Received On 11/19/14  Assistance Needed +1  History of Present Illness s/p R RCR  OT Time Calculation  OT Start Time (ACUTE ONLY) 1232  OT Stop Time (ACUTE ONLY) 1242  OT Time Calculation (min) 10 min  Precautions  Precautions Shoulder  Type of Shoulder Precautions sling on except for bathing, dressing, pendulums (per Kennyth Lose, [place])  Pain Assessment  Faces Pain Scale 2  Pain Location R shoulder  Pain Descriptors / Indicators Sore  Pain Intervention(s) Limited activity within patient's tolerance;Monitored during session;Premedicated before session;Repositioned  Cognition  Arousal/Alertness Awake/alert  Behavior During Therapy WFL for tasks assessed/performed  Overall Cognitive Status Within Functional Limits for tasks assessed  ADL  General ADL Comments husband present:  he donned sling with supervision.  Reviewed shoulder protocol and pt performed pendulum exercises.  Husband verbalizes understanding of all education  Restrictions  Other Position/Activity Restrictions NWB  OT Assessment/Plan  Follow Up Recommendations (pt will follow up with Dr Tonita Cong)  OT Equipment None recommended by OT  OT Goal Progression  Progress towards OT goals Goals met/education completed, patient discharged from OT  OT G-codes **NOT [place]**  Self Care Discharge Status [phone]) CL  OT General Charges  $OT Visit 1 Procedure  OT Treatments  $Self Care/Home Management  8-22 mins  Lesle Chris, OTR/L [phone] 11/19/2014

## 2014-11-19 NOTE — Progress Notes (Signed)
Subjective: 1 Day Post-Op Procedure(s) (LRB): RIGHT SHOULDER MINI OPEN ROTATOR CUFF REPAIR SUBACROMIAL DECOMPRESSION   (Right) Patient reports pain as 3 on 0-10 scale.    Objective: Vital signs in last 24 hours: Temp:  [97.4 F (36.3 C)-98.4 F (36.9 C)] 98.1 F (36.7 C) (11/04 0947) Pulse Rate:  [60-95] 60 (11/04 0947) Resp:  [15-17] 16 (11/04 0947) BP: (110-159)/(53-94) 133/58 mmHg (11/04 0947) SpO2:  [94 %-100 %] 100 % (11/04 0947) Weight:  [96.616 kg (213 lb)] 96.616 kg (213 lb) (11/03 1308)  Intake/Output from previous day: 11/03 0701 - 11/04 0700 In: 2007.5 [P.O.:480; I.V.:1427.5; IV Piggyback:100] Out: 1325 [Urine:1300; Blood:25] Intake/Output this shift: Total I/O In: 240 [P.O.:240] Out: -    Recent Labs  11/19/14 0521  HGB 11.7*    Recent Labs  11/19/14 0521  WBC 12.2*  RBC 4.08  HCT 36.4  PLT 289    Recent Labs  11/19/14 0521  NA 141  K 3.6  CL 104  CO2 26  BUN 17  CREATININE 0.93  GLUCOSE 140*  CALCIUM 9.1   No results for input(s): LABPT, INR in the last 72 hours.  Neurologically intact Sensation intact distally Intact pulses distally Dorsiflexion/Plantar flexion intact Compartment soft  Assessment/Plan: 1 Day Post-Op Procedure(s) (LRB): RIGHT SHOULDER MINI OPEN ROTATOR CUFF REPAIR SUBACROMIAL DECOMPRESSION   (Right) Advance diet Up with therapy Discharge home with home health instr given  Cresencio Reesor C 11/19/2014, 12:10 PM

## 2014-11-19 NOTE — Evaluation (Signed)
Occupational Therapy Evaluation Patient Details Name: Taylor GibsonJustina F Lupinacci MRN: [id number] DOB: 05/04/1965 Today's Date: 11/19/2014    History of Present Illness s/p R RCR   Clinical Impression   This 49 year old female was admitted for the above surgery.  Completed education with her but will follow in acute to educate family and make sure they are comfortable donning/doffing sling without moving pt's RUE.    Follow Up Recommendations   (pt will follow up with dr Shelle Ironbeane)    Equipment Recommendations  None recommended by OT    Recommendations for Other Services       Precautions / Restrictions Precautions Precautions: Shoulder Type of Shoulder Precautions: sling on except for bathing, dressing, pendulums ([place] Restrictions Weight Bearing Restrictions: Yes Other Position/Activity Restrictions: NWB      Mobility Bed Mobility Overal bed mobility: Modified Independent             General bed mobility comments: to lie back down.  Assisted with positioning RUE on pillow only  Transfers Overall transfer level: Independent                    Balance                                            ADL Overall ADL's : Needs assistance/impaired     Grooming: Set up;Sitting   Upper Body Bathing: Moderate assistance;Sitting   Lower Body Bathing: Moderate assistance;Sit to/from stand   Upper Body Dressing : Maximal assistance;Sitting   Lower Body Dressing: Maximal assistance;Sit to/from stand                 General ADL Comments: educated on shoulder protocol, performed ADL, pendulum exercises and educated on donning sling/positioning in bed.  Husband will be in later.  Pt needed cues not to move RUE during adls. She has used commode several times and ambulated in room with supervision to perform pendulums.  Protocol given and pt verbalizes understanding.  Will return to review protocol/sling with family later. Pt demonstrated  pendulums and handout given     Vision     Perception     Praxis      Pertinent Vitals/Pain Pain Assessment: Faces Faces Pain Scale: Hurts little more Pain Location: R shoulder Pain Descriptors / Indicators: Aching Pain Intervention(s): Limited activity within patient's tolerance;Monitored during session;Premedicated before session;Repositioned;Ice applied     Hand Dominance Right (but has had to use L more due to pain)   Extremity/Trunk Assessment Upper Extremity Assessment Upper Extremity Assessment: RUE deficits/detail RUE Deficits / Details: immobilized.  elbow, wrist, fingers wfls           Communication Communication Communication: No difficulties   Cognition Arousal/Alertness: Awake/alert Behavior During Therapy: WFL for tasks assessed/performed Overall Cognitive Status: Within Functional Limits for tasks assessed                     General Comments       Exercises       Shoulder Instructions      Home Living Family/patient expects to be discharged to:: Private residence Living Arrangements: Spouse/significant other                               Additional Comments: pt has a walk in shower  but feels she will sponge bathe initially.  Is able to get off toilet without using arms      Prior Functioning/Environment Level of Independence: Independent             OT Diagnosis: Acute pain   OT Problem List: Decreased strength;Decreased knowledge of precautions;Pain   OT Treatment/Interventions: Self-care/ADL training;DME and/or AE instruction;Patient/family education    OT Goals(Current goals can be found in the care plan section) Acute Rehab OT Goals Patient Stated Goal: get arm healed and use it better without pain OT Goal Formulation: With patient Time For Goal Achievement: 11/26/14 Potential to Achieve Goals: Good ADL Goals Additional ADL Goal #1: family will verbalize understanding of shoulder protocol and cueing pt  not to use RUE during adls Additional ADL Goal #2: family will don sling with supervision  OT Frequency: Min 2X/week   Barriers to D/C:            Co-evaluation              End of Session    Activity Tolerance: Patient tolerated treatment well Patient left: in bed;with call bell/phone within reach;with family/visitor present   Time: 7829-5621 OT Time Calculation (min): 40 min Charges:  OT General Charges $OT Visit: 1 Procedure OT Evaluation $Initial OT Evaluation Tier I: 1 Procedure OT Treatments $Self Care/Home Management : 8-22 mins $Therapeutic Exercise: 8-22 mins G-Codes: OT G-codes **NOT FOR INPATIENT CLASS** Functional Assessment Tool Used: clinical observation Functional Limitation: Self care Self Care Current Status (H0865): At least 60 percent but less than 80 percent impaired, limited or restricted Self Care Goal Status (H8469): At least 60 percent but less than 80 percent impaired, limited or restricted  [facility] 11/19/2014, 10:54 AM  Marica Otter, OTR/L [phone] 11/19/2014

## 2014-11-19 NOTE — Progress Notes (Signed)
Discharge instructions and prescriptions given to patient .  Questions answered 

## 2014-11-19 NOTE — Op Note (Signed)
[name], Taylor               ACCOUNT NO.:  [id number]  MEDICAL RECORD NO.:  [id number]  LOCATION:  1621                         [facility]  PHYSICIAN:  Taylor EveryJeffrey Toi Hurst, M.D.    DATE OF BIRTH:  09-02-1965  DATE OF PROCEDURE:  11/18/2014 DATE OF DISCHARGE:                              OPERATIVE REPORT   PREOPERATIVE DIAGNOSIS:  Retracted rotator cuff tear, impingement syndrome of the right shoulder.  POSTOPERATIVE DIAGNOSIS:  Retracted rotator cuff tear, impingement syndrome of the right shoulder.  PROCEDURE PERFORMED: 1. Mini-open rotator cuff repair utilizing SwiveLock suture anchors. 2. Subacromial decompression. 3. Acromioplasty.  ANESTHESIA:  General.  A scalene block of the upper extremity was employed.  ASSISTANT:  Taylor PocheJacqueline Hurst, P.A.  HISTORY:  A 49 year old with retracted tear of the rotator cuff, impingement pain, subluxation of biceps, indicated for repair of the rotator cuff, possible repair of the biceps.  Risks and benefits were discussed including bleeding, infection, suboptimal range of motion, DVT, PE, anesthetic complications, etc.  TECHNIQUE:  With the patient in supine beach-chair position, after induction of adequate general anesthesia, 2 g Kefzol, the right shoulder and upper extremities prepped and draped in usual sterile fashion. Surgical marker utilized to delineate the acromion, AC joint, coracoid. A 2 cm incision over the anterolateral aspect of the acromion was made through the skin only.  Subcutaneous tissue was dissected. Electrocautery was utilized to achieve hemostasis.  The raphe between the anterolateral heads of the deltoid was identified and divided in line with skin incision.  We infiltrated the musculature with 0.25% Marcaine with epinephrine.  Self-retaining retractor was placed.  We preserved the deltoid attachment.  I skeletonized the remainder of the anterolateral aspect of the acromion, released CA ligament.  I  removed the spurs off the anterolateral aspect of the acromion with 3 mm Kerrison.  Digitally lysed subacromial adhesions and subdeltoid adhesions.  Hypertrophic bursa was noted and excised.  A full-thickness retracted tear of the rotator cuff was identified, 2 x 2 cm.  Fashioned the trough into the greater tuberosity lateral to the articular surface. I placed a single SwiveLock just lateral to the articular surface in the greater tuberosity, fashioning the pilot hole with an awl, inserted the SwiveLock with excellent resistance to pull out.  Used a FiberTape and the rescue suture to grasp the leaflets of the infraspinatus and supraspinatus.  Mobilized them to the lateral aspect of the greater tuberosity.  The biceps tendon was in its groove and reduced.  No evidence of tearing.  Secured over the lateral aspect of the greater tuberosity in a double row fashion with 2 SwiveLock as the appropriate fashioning the pilot hole with an awl and insertion across the leaflets of the suture and insertion into the SwiveLock.  The arm at its side without undue tension.  This advanced the tendon about a centimeter and a half and full coverage was noted.  There was some attenuation prior to this noted of the tendon itself.  We had mobilized the articular and bursal side.  Following this, inspection of the remainder of the cuff was unremarkable.  Copiously irrigated the wound.  No active bleeding. I then repaired the raphe with #  1 Vicryl, subcu with 2, and skin with subcuticular Prolene.  Sterile dressing was applied.  Placed in a sling, extubated without difficulty, and transported to the recovery room in satisfactory condition.  The patient tolerated the procedure well.  No complications.  Assistant, Taylor Hurst, P.A., was used for patient positioning, gentle traction, and insertion of the devices.  ESTIMATED BLOOD LOSS:  Minimal.     Taylor Hurst, M.D.     JB/MEDQ  D:  11/18/2014   T:  11/19/2014  Job:  [id number]

## 2014-11-22 NOTE — Discharge Summary (Signed)
Patient ID: Taylor GibsonJustina F Hurst MRN: [id number] DOB/AGE: 49/03/1965 49 y.o.  Admit date: 11/18/2014 Discharge date: 11/22/2014  Admission Diagnoses:  Active Problems:   Complete rotator cuff tear   Discharge Diagnoses:  Same  Past Medical History  Diagnosis Date  . Nausea   . Hypertension   . Arthritis   . Chronic cholecystitis 05/30/2011  . Achilles tendinitis 09/23/2014    Right leg   . Torn rotator cuff 09/23/2014    Right side   . Heart murmur   . GERD (gastroesophageal reflux disease)     hx of pt currently has no issues since gallbladder surgery   . Hepatitis     2013 chronic active hepatitis during gallbladder issues  . History of blood transfusion     19983  . Osteomyelitis (HCC)     1983 right leg;1988 left leg    Surgeries: Procedure(s): RIGHT SHOULDER MINI OPEN ROTATOR CUFF REPAIR SUBACROMIAL DECOMPRESSION   on 11/18/2014   Consultants:    Discharged Condition: Improved  Hospital Course: Taylor Hurst is an 49 y.o. female who was admitted 11/18/2014 for operative treatment of right shoulder rotator cuff tear. Patient has severe unremitting pain that affects sleep, daily activities, and work/hobbies. After pre-op clearance the patient was taken to the operating room on 11/18/2014 and underwent  Procedure(s): RIGHT SHOULDER MINI OPEN ROTATOR CUFF REPAIR SUBACROMIAL DECOMPRESSION  .    Patient was given perioperative antibiotics:  Anti-infectives    Start     Dose/Rate Route Frequency Ordered Stop   11/18/14 1500  ceFAZolin (ANCEF) IVPB 2 g/50 mL premix     2 g 100 mL/hr over 30 Minutes Intravenous Every 6 hours 11/18/14 1318 11/19/14 0254   11/18/14 1128  polymyxin B 500,000 Units, bacitracin 50,000 Units in sodium chloride irrigation 0.9 % 500 mL irrigation  Status:  Discontinued       As needed 11/18/14 1128 11/18/14 1200   11/18/14 0900  clindamycin (CLEOCIN) IVPB 900 mg     900 mg 100 mL/hr over 30 Minutes Intravenous On call to O.R. 11/18/14 0855 11/18/14 1125        Patient was given sequential compression devices, early ambulation, and chemoprophylaxis to prevent DVT.  Patient benefited maximally from hospital stay and there were no complications.    Recent vital signs: No data found.    Recent laboratory studies: No results for input(s): WBC, HGB, HCT, PLT, NA, K, CL, CO2, BUN, CREATININE, GLUCOSE, INR, CALCIUM in the last 72 hours.  Invalid input(s): PT, 2   Discharge Medications:     Medication List    TAKE these medications        amLODipine 10 MG tablet  Commonly known as:  NORVASC  Take 10 mg by mouth daily with breakfast.     b complex vitamins tablet  Take 1 tablet by mouth daily.     BEPREVE 1.5 % Soln  Generic drug:  Bepotastine Besilate  INSTILL 1 DROP INTO BOTH EYES BID     BIOTIN PO  Take 10,000 mg by mouth daily.     Black Cohosh 540 MG Caps  Take 540 mg by mouth daily.     docusate sodium 100 MG capsule  Commonly known as:  COLACE  Take 1 capsule (100 mg total) by mouth 2 (two) times daily as needed for mild constipation.     FIBER PO  Take 5 tablets by mouth 2 (two) times daily.     Fish Oil 1000 MG Caps  Take 1,000 mg by mouth 2 (two) times daily.     HYDROcodone-acetaminophen 10-325 MG tablet  Commonly known as:  NORCO  Take 1 tablet by mouth every 6 (six) hours as needed.     levonorgestrel 20 MCG/24HR IUD  Commonly known as:  MIRENA  1 each by Intrauterine route once.     Melatonin 5 MG Caps  Take 10 mg by mouth at bedtime.     meloxicam 15 MG tablet  Commonly known as:  MOBIC  Take 1 tablet (15 mg total) by mouth daily.     methocarbamol 500 MG tablet  Commonly known as:  ROBAXIN  Take 1 tablet (500 mg total) by mouth 3 (three) times daily.     MULTI-VITAMIN DAILY PO  Take 1 tablet by mouth daily with breakfast. Women's     Red Yeast Rice Extract 600 MG Caps  Take 600 mg by mouth 2 (two) times daily.     Tavaborole 5 % Soln  Commonly known as:  KERYDIN  Apply 1 drop topically  1 day or 1 dose. Apply 1 drop to the toenail daily.     traMADol 50 MG tablet  Commonly known as:  ULTRAM  Take 50 mg by mouth at bedtime as needed for moderate pain.     valsartan-hydrochlorothiazide 320-25 MG tablet  Commonly known as:  DIOVAN-HCT  Take 1 tablet by mouth daily with breakfast.        Diagnostic Studies: No results found.  Disposition: 01-Home or Self Care      Discharge Instructions    Call MD / Call 911    Complete by:  As directed   If you experience chest pain or shortness of breath, CALL 911 and be transported to the hospital emergency room.  If you develope a fever above 101 F, pus (Swingler drainage) or increased drainage or redness at the wound, or calf pain, call your surgeon's office.     Constipation Prevention    Complete by:  As directed   Drink plenty of fluids.  Prune juice may be helpful.  You may use a stool softener, such as Colace (over the counter) 100 mg twice a day.  Use MiraLax (over the counter) for constipation as needed.     Diet - low sodium heart healthy    Complete by:  As directed      Increase activity slowly as tolerated    Complete by:  As directed            Follow-up Information    Follow up with BEANE,JEFFREY C, MD In 2 weeks.   Specialty:  Orthopedic Surgery   Why:  For suture removal   Contact information:   [address] [place] [address] [phone]        Signed: Dorothy Spark. 11/22/2014, 8:45 AM

## 2014-12-03 ENCOUNTER — Encounter: Payer: Self-pay | Admitting: Family Medicine

## 2014-12-03 ENCOUNTER — Ambulatory Visit (INDEPENDENT_AMBULATORY_CARE_PROVIDER_SITE_OTHER): Payer: Managed Care, Other (non HMO) | Admitting: Family Medicine

## 2014-12-03 ENCOUNTER — Ambulatory Visit
Admission: RE | Admit: 2014-12-03 | Discharge: 2014-12-03 | Disposition: A | Discharge status: Home / Self Care | Payer: Managed Care, Other (non HMO) | Source: Ambulatory Visit | Attending: Family Medicine | Admitting: Family Medicine

## 2014-12-03 VITALS — BP 122/78 | HR 84 | Temp 98.2°F | Resp 16 | Ht 61.0 in | Wt 218.3 lb

## 2014-12-03 DIAGNOSIS — R0789 Other chest pain: Secondary | ICD-10-CM

## 2014-12-03 NOTE — Progress Notes (Signed)
Name: Taylor Hurst   MRN: [id number]    DOB: 03-19-1965   Date:12/03/2014       Progress Note  Subjective  Chief Complaint  Chief Complaint  Patient presents with  . Anxiety    Patient states she is unable to breathe at night sometimes   Anxiety Presents for initial visit. Onset was 1 to 4 weeks ago (2 weeks ago). Symptoms include chest pain, feeling of choking, nervous/anxious behavior and shortness of breath. Primary symptoms comment: Pt. had a CXR done at [facility] which was normal.. Symptoms occur most days.   Her past medical history is significant for anxiety/panic attacks. Past treatments include nothing.    Past Medical History  Diagnosis Date  . Nausea   . Hypertension   . Arthritis   . Chronic cholecystitis 05/30/2011  . Achilles tendinitis 09/23/2014    Right leg   . Torn rotator cuff 09/23/2014    Right side   . Heart murmur   . GERD (gastroesophageal reflux disease)     hx of pt currently has no issues since gallbladder surgery   . Hepatitis     2013 chronic active hepatitis during gallbladder issues  . History of blood transfusion     19983  . Osteomyelitis (HCC)     1983 right leg;1988 left leg    Past Surgical History  Procedure Laterality Date  . Osteomilitis      right and left leg/surgery; right 1983; left 1988  . Osteomylitis  4098,1191    right leg, left leg  . Intraoperative cholangiogram  07/26/2011    Procedure: INTRAOPERATIVE CHOLANGIOGRAM;  Surgeon: Ardeth Sportsman, MD;  Location: [facility];  Service: General;  Laterality: N/A;  . Liver biopsy  07/26/2011    Procedure: LIVER BIOPSY;  Surgeon: Ardeth Sportsman, MD;  Location: [facility];  Service: General;  Laterality: N/A;  core liver biopsy   . Cholecystectomy  07/26/11  . Shoulder open rotator cuff repair Right 11/18/2014    Procedure: RIGHT SHOULDER MINI OPEN ROTATOR CUFF REPAIR SUBACROMIAL DECOMPRESSION  ;  Surgeon: Jene Every, MD;  Location: [facility];  Service: Orthopedics;  Laterality:  Right;    Family History  Problem Relation Age of Onset  . COPD Mother   . Hypertension Mother   . Hyperlipidemia Mother   . Alcohol abuse Father   . Cancer Maternal Aunt     breast    Social History   Social History  . Marital Status: Married    Spouse Name: N/A  . Number of Children: N/A  . Years of Education: N/A   Occupational History  . Not on file.   Social History Main Topics  . Smoking status: Never Smoker   . Smokeless tobacco: Never Used  . Alcohol Use: 0.0 oz/week    0 Standard drinks or equivalent per week     Comment: occas  . Drug Use: No  . Sexual Activity: Yes    Birth Control/ Protection: IUD   Other Topics Concern  . Not on file   Social History Narrative     Current outpatient prescriptions:  .  amLODipine (NORVASC) 10 MG tablet, Take 10 mg by mouth daily with breakfast. , Disp: , Rfl:  .  b complex vitamins tablet, Take 1 tablet by mouth daily., Disp: , Rfl:  .  BEPREVE 1.5 % SOLN, INSTILL 1 DROP INTO BOTH EYES BID, Disp: , Rfl: 3 .  BIOTIN PO, Take 10,000 mg by mouth daily.,  Disp: , Rfl:  .  Black Cohosh 540 MG CAPS, Take 540 mg by mouth daily. , Disp: , Rfl:  .  docusate sodium (COLACE) 100 MG capsule, Take 1 capsule (100 mg total) by mouth 2 (two) times daily as needed for mild constipation., Disp: 20 capsule, Rfl: 1 .  FIBER PO, Take 5 tablets by mouth 2 (two) times daily., Disp: , Rfl:  .  HYDROcodone-acetaminophen (NORCO) 10-325 MG tablet, Take 1 tablet by mouth every 6 (six) hours as needed., Disp: 50 tablet, Rfl: 0 .  levonorgestrel (MIRENA) 20 MCG/24HR IUD, 1 each by Intrauterine route once. , Disp: , Rfl:  .  Melatonin 5 MG CAPS, Take 10 mg by mouth at bedtime. , Disp: , Rfl:  .  meloxicam (MOBIC) 15 MG tablet, Take 1 tablet (15 mg total) by mouth daily., Disp: 90 tablet, Rfl: 1 .  methocarbamol (ROBAXIN) 500 MG tablet, Take 1 tablet (500 mg total) by mouth 3 (three) times daily., Disp: 30 tablet, Rfl: 1 .  Multiple Vitamin  (MULTI-VITAMIN DAILY PO), Take 1 tablet by mouth daily with breakfast. Women's, Disp: , Rfl:  .  Omega-3 Fatty Acids (FISH OIL) 1000 MG CAPS, Take 1,000 mg by mouth 2 (two) times daily., Disp: , Rfl:  .  Red Yeast Rice Extract 600 MG CAPS, Take 600 mg by mouth 2 (two) times daily. , Disp: , Rfl:  .  Tavaborole (KERYDIN) 5 % SOLN, Apply 1 drop topically 1 day or 1 dose. Apply 1 drop to the toenail daily., Disp: 1 Bottle, Rfl: 2 .  traMADol (ULTRAM) 50 MG tablet, Take 50 mg by mouth at bedtime as needed for moderate pain. , Disp: , Rfl:  .  valsartan-hydrochlorothiazide (DIOVAN-HCT) 320-25 MG per tablet, Take 1 tablet by mouth daily with breakfast. , Disp: , Rfl:   Allergies  Allergen Reactions  . Percocet [Oxycodone-Acetaminophen] Anaphylaxis    bronchospasms  . Adhesive [Tape] Itching  . Betadine  [Povidone Iodine]     blisters  . Other     Betadine=blisters skin  ,Cashew Nuts,sunflower seeds-swelling and itching  . Penicillins Hives    Has patient had a PCN reaction causing immediate rash, facial/tongue/throat swelling, SOB or lightheadedness with hypotension: no Has patient had a PCN reaction causing severe rash involving mucus membranes or skin necrosis: no Has patient had a PCN reaction that required hospitalization happened in the hospital Has patient had a PCN reaction occurring within the last 10 years: no If all of the above answers are "NO", then may proceed with Cephalosporin use.   . Benadryl [Diphenhydramine Hcl] Rash     Review of Systems  Constitutional: Negative for fever and chills.  Respiratory: Positive for shortness of breath.   Cardiovascular: Positive for chest pain.  Psychiatric/Behavioral: The patient is nervous/anxious.    Objective  Filed Vitals:   12/03/14 1130  BP: 122/78  Pulse: 84  Temp: 98.2 F (36.8 C)  TempSrc: Oral  Resp: 16  Height: 5\' 1"  (1.549 m)  Weight: 218 lb 4.8 oz (99.02 kg)  SpO2: 96%    Physical Exam  Constitutional: She is  oriented to person, place, and time and well-developed, well-nourished, and in no distress.  Cardiovascular: Normal rate, regular rhythm and normal heart sounds.   No murmur heard. Pulmonary/Chest: Effort normal and breath sounds normal. She has no wheezes.  Neurological: She is alert and oriented to person, place, and time.  Skin: Skin is warm and dry.  Psychiatric: Mood, memory, affect and judgment  normal.  Nursing note and vitals reviewed.   Assessment & Plan  1. Chest tightness Given her symptoms of chest tightness and sensation of choking,we will obtain a chest x-ray to rule out any abnormality. EKG reviewed. If chest x-ray is negative, we will start patient on an anxiolytic. - DG Chest 2 View; Future   Trinitey Roache Asad A[facility] 12/03/2014 12:13 PM

## 2014-12-07 ENCOUNTER — Telehealth: Payer: Self-pay

## 2014-12-07 DIAGNOSIS — F419 Anxiety disorder, unspecified: Secondary | ICD-10-CM

## 2014-12-07 MED ORDER — ALPRAZOLAM 0.25 MG PO TABS: 0.2500 mg | ORAL_TABLET | Freq: Two times a day (BID) | ORAL | Status: DC | PRN

## 2014-12-07 NOTE — Telephone Encounter (Signed)
Assessment patient's symptoms of anxiety. Chest x-ray is negative. We'll start on alprazolam 0.25 mg twice a day as needed for relief. Educated patient on at adverse effects and tolerance potential for alprazolam and that it should be taken only as needed and as directed. Patient verbalized understanding and will pick up prescription tomorrow afternoon.

## 2014-12-07 NOTE — Telephone Encounter (Signed)
Routed to Dr. Shah for prescription approval 

## 2014-12-30 ENCOUNTER — Ambulatory Visit
Admission: RE | Admit: 2014-12-30 | Discharge: 2014-12-30 | Disposition: A | Discharge status: Home / Self Care | Payer: Managed Care, Other (non HMO) | Source: Ambulatory Visit | Attending: Podiatry | Admitting: Podiatry

## 2014-12-30 DIAGNOSIS — S86011A Strain of right Achilles tendon, initial encounter: Secondary | ICD-10-CM

## 2015-01-06 ENCOUNTER — Ambulatory Visit: Payer: Managed Care, Other (non HMO) | Admitting: Family Medicine

## 2015-01-07 ENCOUNTER — Ambulatory Visit: Payer: Managed Care, Other (non HMO) | Admitting: Family Medicine

## 2015-01-11 ENCOUNTER — Encounter: Payer: Self-pay | Admitting: Podiatry

## 2015-01-11 ENCOUNTER — Ambulatory Visit (INDEPENDENT_AMBULATORY_CARE_PROVIDER_SITE_OTHER): Payer: Managed Care, Other (non HMO) | Admitting: Podiatry

## 2015-01-11 VITALS — BP 128/77 | HR 94 | Resp 18

## 2015-01-11 DIAGNOSIS — S86011A Strain of right Achilles tendon, initial encounter: Secondary | ICD-10-CM

## 2015-01-11 DIAGNOSIS — M7661 Achilles tendinitis, right leg: Secondary | ICD-10-CM

## 2015-01-11 DIAGNOSIS — S86019A Strain of unspecified Achilles tendon, initial encounter: Secondary | ICD-10-CM | POA: Insufficient documentation

## 2015-01-11 MED ORDER — METHYLPREDNISOLONE 4 MG PO TBPK: ORAL_TABLET | ORAL | Status: DC

## 2015-01-11 NOTE — Progress Notes (Signed)
Patient ID: Taylor GibsonJustina F Hurst, female   DOB: 12/03/1965, 49 y.o.   MRN: [id number]  Subjective:  49 year old female presents the office they for follow-up evaluation of right Achilles tendinitis hypertrophy of Achilles tendon. She's currently still going to physical therapy for her shoulder after ongoing rotator cuff surgery but they have completed physical therapy for the foot. She can DC have pain to the Achilles tendon. She presents today to discuss MRI results as well. She has been continuing with home exercises as well. No other complaints at this time in no acute changes since last appointment. Denies any systemic complaints such as fevers, chills, nausea, vomiting. No calf pain, chest pain, shortness of breath.  Objective: AAO 3, NAD DP/PT pulses palpable 2/4, CRT less than 3 seconds Protective sensation intact with Dorann OuSimms Weinstein monofilament There is continuation of tenderness in the mid substance the right Achilles tendon there does appear to be hypertrophy of the tendon. Exam appears to be grossly unchanged. There is no overlying edema, erythema, increase in warmth. Thompson test is negative. There is no specific defect of the Achilles tendon this time. There is no tenderness the foot or ankle. MMT 5/5, ROM WNL. There are no other areas of tenderness bilateral lower extremitiies. No open lesions or pre-ulcerative lesions. No pain with calf compression, swelling, warmth, erythema.  Assessment:  49 year old female continuation of right Achilles tendinitis  Plan: -Treatment options discussed including all alternatives, risks, and complications -MRI results discussed the patient which does reveal mild  appearing tendinosis without tear. The study is otherwise negative. -Discussed with her conservative and surgical treatment. She'll continue with conservative treatment. Dispensed night splint. Medrol Dosepak. Continue home stretching exercises. Discussed with her along with the inflammation goes  on there is a possibility of tearing. Discussed EPAT -Follow-up in 4 weeks or sooner if any problems arise. In the meantime, encouraged to call the office with any questions, concerns, change in symptoms.   Ovid CurdMatthew Wagoner, DPM

## 2015-01-13 ENCOUNTER — Ambulatory Visit: Payer: Managed Care, Other (non HMO) | Admitting: Family Medicine

## 2015-01-24 ENCOUNTER — Ambulatory Visit: Payer: Managed Care, Other (non HMO) | Admitting: Family Medicine

## 2015-02-09 ENCOUNTER — Ambulatory Visit (INDEPENDENT_AMBULATORY_CARE_PROVIDER_SITE_OTHER): Payer: Managed Care, Other (non HMO) | Admitting: Family Medicine

## 2015-02-09 ENCOUNTER — Encounter: Payer: Self-pay | Admitting: Family Medicine

## 2015-02-09 VITALS — BP 128/71 | HR 102 | Temp 98.5°F | Resp 20 | Ht 61.0 in | Wt 219.2 lb

## 2015-02-09 DIAGNOSIS — F411 Generalized anxiety disorder: Secondary | ICD-10-CM | POA: Diagnosis not present

## 2015-02-09 DIAGNOSIS — I10 Essential (primary) hypertension: Secondary | ICD-10-CM

## 2015-02-09 DIAGNOSIS — M170 Bilateral primary osteoarthritis of knee: Secondary | ICD-10-CM | POA: Diagnosis not present

## 2015-02-09 MED ORDER — MELOXICAM 15 MG PO TABS
15.0000 mg | ORAL_TABLET | Freq: Every day | ORAL | Status: DC
Start: 2015-02-09 — End: 2015-06-16

## 2015-02-09 MED ORDER — ALPRAZOLAM 0.25 MG PO TABS: 0.2500 mg | ORAL_TABLET | Freq: Two times a day (BID) | ORAL | Status: DC | PRN

## 2015-02-09 MED ORDER — AMLODIPINE BESYLATE 10 MG PO TABS: 10.0000 mg | ORAL_TABLET | Freq: Every day | ORAL | Status: DC

## 2015-02-09 MED ORDER — VALSARTAN-HYDROCHLOROTHIAZIDE 320-25 MG PO TABS: 1.0000 | ORAL_TABLET | Freq: Every day | ORAL | Status: DC

## 2015-02-09 NOTE — Progress Notes (Signed)
Name: Taylor Hurst   MRN: [id number]    DOB: Jul 13, 1965   Date:02/09/2015       Progress Note  Subjective  Chief Complaint  Chief Complaint  Patient presents with  . Follow-up    4 mo  . Hypertension  . Anxiety  . Medication Refill    Hypertension This is a chronic problem. Associated symptoms include anxiety. Pertinent negatives include no blurred vision, chest pain, headaches or palpitations. Past treatments include calcium channel blockers, angiotensin blockers and diuretics.  Anxiety Presents for follow-up visit. The problem has been unchanged. Symptoms include excessive worry and nervous/anxious behavior. Patient reports no chest pain, insomnia or palpitations.   Her past medical history is significant for anxiety/panic attacks. Past treatments include benzodiazephines.  Arthritis Presents for follow-up visit. The disease course has been stable. She complains of pain, stiffness and joint swelling. Affected locations include the right knee and left knee. Pertinent negatives include no fever. Past treatments include NSAIDs. The treatment provided significant relief.    Past Medical History  Diagnosis Date  . Nausea   . Hypertension   . Arthritis   . Chronic cholecystitis 05/30/2011  . Achilles tendinitis 09/23/2014    Right leg   . Torn rotator cuff 09/23/2014    Right side   . Heart murmur   . GERD (gastroesophageal reflux disease)     hx of pt currently has no issues since gallbladder surgery   . Hepatitis     2013 chronic active hepatitis during gallbladder issues  . History of blood transfusion     19983  . Osteomyelitis (HCC)     1983 right leg;1988 left leg    Past Surgical History  Procedure Laterality Date  . Osteomilitis      right and left leg/surgery; right 1983; left 1988  . Osteomylitis  4098,1191    right leg, left leg  . Intraoperative cholangiogram  07/26/2011    Procedure: INTRAOPERATIVE CHOLANGIOGRAM;  Surgeon: Ardeth Sportsman, MD;  Location: [facility];  Service: General;  Laterality: N/A;  . Liver biopsy  07/26/2011    Procedure: LIVER BIOPSY;  Surgeon: Ardeth Sportsman, MD;  Location: [facility];  Service: General;  Laterality: N/A;  core liver biopsy   . Cholecystectomy  07/26/11  . Shoulder open rotator cuff repair Right 11/18/2014    Procedure: RIGHT SHOULDER MINI OPEN ROTATOR CUFF REPAIR SUBACROMIAL DECOMPRESSION  ;  Surgeon: Jene Every, MD;  Location: [facility];  Service: Orthopedics;  Laterality: Right;    Family History  Problem Relation Age of Onset  . COPD Mother   . Hypertension Mother   . Hyperlipidemia Mother   . Alcohol abuse Father   . Cancer Maternal Aunt     breast    Social History   Social History  . Marital Status: Married    Spouse Name: N/A  . Number of Children: N/A  . Years of Education: N/A   Occupational History  . Not on file.   Social History Main Topics  . Smoking status: Never Smoker   . Smokeless tobacco: Never Used  . Alcohol Use: 0.0 oz/week    0 Standard drinks or equivalent per week     Comment: occas  . Drug Use: No  . Sexual Activity: Yes    Birth Control/ Protection: IUD   Other Topics Concern  . Not on file   Social History Narrative     Current outpatient prescriptions:  .  ALPRAZolam (XANAX) 0.25 MG tablet,  Take 1 tablet (0.25 mg total) by mouth 2 (two) times daily as needed for anxiety., Disp: 60 tablet, Rfl: 0 .  amLODipine (NORVASC) 10 MG tablet, Take 10 mg by mouth daily with breakfast. , Disp: , Rfl:  .  b complex vitamins tablet, Take 1 tablet by mouth daily., Disp: , Rfl:  .  BEPREVE 1.5 % SOLN, INSTILL 1 DROP INTO BOTH EYES BID, Disp: , Rfl: 3 .  BIOTIN PO, Take 10,000 mg by mouth daily., Disp: , Rfl:  .  Black Cohosh 540 MG CAPS, Take 540 mg by mouth daily. , Disp: , Rfl:  .  FIBER PO, Take 5 tablets by mouth 2 (two) times daily., Disp: , Rfl:  .  levonorgestrel (MIRENA) 20 MCG/24HR IUD, 1 each by Intrauterine route once. , Disp: , Rfl:  .  Melatonin 5 MG CAPS,  Take 10 mg by mouth at bedtime. , Disp: , Rfl:  .  meloxicam (MOBIC) 15 MG tablet, Take 1 tablet (15 mg total) by mouth daily., Disp: 90 tablet, Rfl: 1 .  methocarbamol (ROBAXIN) 500 MG tablet, Take 1 tablet (500 mg total) by mouth 3 (three) times daily., Disp: 30 tablet, Rfl: 1 .  Multiple Vitamin (MULTI-VITAMIN DAILY PO), Take 1 tablet by mouth daily with breakfast. Women's, Disp: , Rfl:  .  Omega-3 Fatty Acids (FISH OIL) 1000 MG CAPS, Take 1,000 mg by mouth 2 (two) times daily., Disp: , Rfl:  .  Red Yeast Rice Extract 600 MG CAPS, Take 600 mg by mouth 2 (two) times daily. , Disp: , Rfl:  .  valsartan-hydrochlorothiazide (DIOVAN-HCT) 320-25 MG per tablet, Take 1 tablet by mouth daily with breakfast. , Disp: , Rfl:   Allergies  Allergen Reactions  . Percocet [Oxycodone-Acetaminophen] Anaphylaxis    bronchospasms  . Adhesive [Tape] Itching  . Allergy Relief  [Chlorpheniramine Maleate]     Heart flutters  . Betadine  [Povidone Iodine]     blisters blisters  . Other     Betadine=blisters skin  ,Cashew Nuts,sunflower seeds-swelling and itching  . Penicillins Hives    Has patient had a PCN reaction causing immediate rash, facial/tongue/throat swelling, SOB or lightheadedness with hypotension: no Has patient had a PCN reaction causing severe rash involving mucus membranes or skin necrosis: no Has patient had a PCN reaction that required hospitalization happened in the hospital Has patient had a PCN reaction occurring within the last 10 years: no If all of the above answers are "NO", then may proceed with Cephalosporin use.   . Benadryl [Diphenhydramine Hcl] Rash     Review of Systems  Constitutional: Negative for fever and chills.  Eyes: Negative for blurred vision.  Cardiovascular: Negative for chest pain and palpitations.  Musculoskeletal: Positive for joint pain, joint swelling, arthritis and stiffness.  Neurological: Negative for headaches.  Psychiatric/Behavioral: The patient is  nervous/anxious. The patient does not have insomnia.      Objective  Filed Vitals:   02/09/15 1039  BP: 128/71  Pulse: 102  Temp: 98.5 F (36.9 C)  TempSrc: Oral  Resp: 20  Height:  (1.549 m)  Weight: 219 lb 3.2 oz (99.428 kg)  SpO2: 93%    Physical Exam  Constitutional: She is oriented to person, place, and time and well-developed, well-nourished, and in no distress.  HENT:  Head: Normocephalic and atraumatic.  Eyes: Pupils are equal, round, and reactive to light.  Cardiovascular: Normal rate and regular rhythm.   Pulmonary/Chest: Effort normal and breath sounds normal.  Musculoskeletal:  Right knee: She exhibits normal range of motion and no swelling. No tenderness found.       Left knee: She exhibits normal range of motion and no swelling. No tenderness found.  Neurological: She is alert and oriented to person, place, and time.  Psychiatric: Mood, memory, affect and judgment normal.  Nursing note and vitals reviewed.     Assessment & Plan  1. Essential hypertension BP responsive to antihypertensive therapy. - amLODipine (NORVASC) 10 MG tablet; Take 1 tablet (10 mg total) by mouth daily.  Dispense: 90 tablet; Refill: 1 - valsartan-hydrochlorothiazide (DIOVAN-HCT) 320-25 MG tablet; Take 1 tablet by mouth daily.  Dispense: 90 tablet; Refill: 0  2. Primary osteoarthritis of both knees  - meloxicam (MOBIC) 15 MG tablet; Take 1 tablet (15 mg total) by mouth daily.  Dispense: 90 tablet; Refill: 1  3. Generalized anxiety disorder Symptoms stable and controlled on alprazolam. Refills provided. - ALPRAZolam (XANAX) 0.25 MG tablet; Take 1 tablet (0.25 mg total) by mouth 2 (two) times daily as needed for anxiety.  Dispense: 60 tablet; Refill: 0   Aracelys Glade Asad [facility] 02/09/2015 10:59 AM

## 2015-02-10 ENCOUNTER — Encounter: Payer: Self-pay | Admitting: Podiatry

## 2015-02-10 ENCOUNTER — Ambulatory Visit (INDEPENDENT_AMBULATORY_CARE_PROVIDER_SITE_OTHER): Payer: Managed Care, Other (non HMO) | Admitting: Podiatry

## 2015-02-10 VITALS — BP 118/73 | HR 101 | Resp 18

## 2015-02-10 DIAGNOSIS — M7661 Achilles tendinitis, right leg: Secondary | ICD-10-CM | POA: Diagnosis not present

## 2015-02-13 NOTE — Progress Notes (Signed)
Patient ID: Taylor Hurst, female   DOB: [birth year], 50 y.o.   MRN: [id number]  Subjective:  50 year old female presents the office they for follow-up evaluation of right Achilles tendinitis hypertrophy of Achilles tendon. She states that she has continued pain to the area. 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 She does wear heel lift which helps. She does continue with stretching and icing exercises. No recent injury or trauma. Denies any systemic complaints such as fevers, chills, nausea, vomiting. No calf pain, chest pain, shortness of breath.  Objective: AAO 3, NAD DP/PT pulses palpable 2/4, CRT less than 3 seconds Protective sensation intact with Taylor Hurst monofilament There is continuation of tenderness in the mid substance the right Achilles tendon there does appear to be hypertrophy of the tendon. Exam appears to be grossly unchanged from previous appointments. There is no overlying edema, erythema, increase in warmth. Thompson test is negative. There is no defect of the Achilles tendon this time. There is no tenderness the foot or ankle. MMT 5/5, ROM WNL. There are no other areas of tenderness bilateral lower extremitiies. No open lesions or pre-ulcerative lesions. No pain with calf compression, swelling, warmth, erythema.  Assessment:  50 year old female continuation of right Achilles tendinitis  Plan: -Treatment options discussed including all alternatives, risks, and complications -At this point I discussed conservative and surgical treatment options. She wishes to hold off on surgery. I discussed EPAT. She wishes to hold off on those options as well. Discussed doing more physical therapy however she wishes to hold off. She states that she'll continue with stretching and icing exercises as well as a night splint daily. She states  that she will consider her options and call the office. -I discussed with the longer that the inflammation goes on this a risk of tearing or worsening injury. -Follow-up in the next several weeks or sooner if any problems arise. In the meantime, encouraged to call the office with any questions, concerns, change in symptoms.   Ovid Curd, DPM

## 2015-06-09 ENCOUNTER — Ambulatory Visit: Payer: Managed Care, Other (non HMO) | Admitting: Family Medicine

## 2015-06-09 ENCOUNTER — Other Ambulatory Visit: Payer: Self-pay | Admitting: Family Medicine

## 2015-06-09 LAB — HM COLONOSCOPY

## 2015-06-16 ENCOUNTER — Ambulatory Visit (INDEPENDENT_AMBULATORY_CARE_PROVIDER_SITE_OTHER): Payer: Managed Care, Other (non HMO) | Admitting: Family Medicine

## 2015-06-16 ENCOUNTER — Encounter: Payer: Self-pay | Admitting: Family Medicine

## 2015-06-16 VITALS — BP 120/76 | HR 88 | Temp 98.5°F | Resp 17 | Ht 61.0 in | Wt 218.0 lb

## 2015-06-16 DIAGNOSIS — I10 Essential (primary) hypertension: Secondary | ICD-10-CM | POA: Diagnosis not present

## 2015-06-16 DIAGNOSIS — M170 Bilateral primary osteoarthritis of knee: Secondary | ICD-10-CM | POA: Diagnosis not present

## 2015-06-16 DIAGNOSIS — F411 Generalized anxiety disorder: Secondary | ICD-10-CM | POA: Diagnosis not present

## 2015-06-16 MED ORDER — VALSARTAN-HYDROCHLOROTHIAZIDE 320-25 MG PO TABS: 1.0000 | ORAL_TABLET | Freq: Every day | ORAL | Status: DC

## 2015-06-16 MED ORDER — TRAMADOL HCL 50 MG PO TABS: 50.0000 mg | ORAL_TABLET | Freq: Three times a day (TID) | ORAL | Status: DC | PRN

## 2015-06-16 MED ORDER — ALPRAZOLAM 0.25 MG PO TABS: 0.2500 mg | ORAL_TABLET | Freq: Two times a day (BID) | ORAL | Status: DC | PRN

## 2015-06-16 MED ORDER — AMLODIPINE BESYLATE 10 MG PO TABS: 10.0000 mg | ORAL_TABLET | Freq: Every day | ORAL | Status: DC

## 2015-06-16 NOTE — Progress Notes (Signed)
Name: Taylor Hurst   MRN: [id number]    DOB: 1965-04-30   Date:06/16/2015       Progress Note  Subjective  Chief Complaint  Chief Complaint  Patient presents with  . Follow-up    4 mo  . Medication Refill    Hypertension This is a chronic problem. The problem is controlled. Associated symptoms include anxiety. Pertinent negatives include no chest pain, headaches, palpitations or shortness of breath. Past treatments include angiotensin blockers, diuretics and calcium channel blockers.  Anxiety Presents for follow-up visit. The problem has been unchanged. Symptoms include excessive worry and nervous/anxious behavior. Patient reports no chest pain, palpitations or shortness of breath.   Past treatments include benzodiazephines. The treatment provided significant relief. Compliance with prior treatments has been good.  Arthritis Presents for follow-up visit. The disease course has been stable. She complains of pain. Affected locations include the left knee and right knee. Past treatments include NSAIDs. The treatment provided significant relief. Compliance with prior treatments has been good.       Past Medical History  Diagnosis Date  . Nausea   . Hypertension   . Arthritis   . Chronic cholecystitis 05/30/2011  . Achilles tendinitis 09/23/2014    Right leg   . Torn rotator cuff 09/23/2014    Right side   . Heart murmur   . GERD (gastroesophageal reflux disease)     hx of pt currently has no issues since gallbladder surgery   . Hepatitis     2013 chronic active hepatitis during gallbladder issues  . History of blood transfusion     19983  . Osteomyelitis (HCC)     1983 right leg;1988 left leg    Past Surgical History  Procedure Laterality Date  . Osteomilitis      right and left leg/surgery; right 1983; left 1988  . Osteomylitis  [id number]    right leg, left leg  . Intraoperative cholangiogram  07/26/2011    Procedure: INTRAOPERATIVE CHOLANGIOGRAM;  Surgeon: Ardeth Sportsman, MD;  Location: [facility];  Service: General;  Laterality: N/A;  . Liver biopsy  07/26/2011    Procedure: LIVER BIOPSY;  Surgeon: Ardeth Sportsman, MD;  Location: [facility];  Service: General;  Laterality: N/A;  core liver biopsy   . Cholecystectomy  07/26/11  . Shoulder open rotator cuff repair Right 11/18/2014    Procedure: RIGHT SHOULDER MINI OPEN ROTATOR CUFF REPAIR SUBACROMIAL DECOMPRESSION  ;  Surgeon: Jene Every, MD;  Location: [facility];  Service: Orthopedics;  Laterality: Right;    Family History  Problem Relation Age of Onset  . COPD Mother   . Hypertension Mother   . Hyperlipidemia Mother   . Alcohol abuse Father   . Cancer Maternal Aunt     breast    Social History   Social History  . Marital Status: Married    Spouse Name: N/A  . Number of Children: N/A  . Years of Education: N/A   Occupational History  . Not on file.   Social History Main Topics  . Smoking status: Never Smoker   . Smokeless tobacco: Never Used  . Alcohol Use: 0.0 oz/week    0 Standard drinks or equivalent per week     Comment: occas  . Drug Use: No  . Sexual Activity: Yes    Birth Control/ Protection: IUD   Other Topics Concern  . Not on file   Social History Narrative     Current outpatient prescriptions:  .  ALPRAZolam (XANAX) 0.25 MG tablet, Take 1 tablet (0.25 mg total) by mouth 2 (two) times daily as needed for anxiety., Disp: 60 tablet, Rfl: 0 .  amLODipine (NORVASC) 10 MG tablet, Take 1 tablet (10 mg total) by mouth daily., Disp: 90 tablet, Rfl: 1 .  b complex vitamins tablet, Take 1 tablet by mouth daily., Disp: , Rfl:  .  BEPREVE 1.5 % SOLN, INSTILL 1 DROP INTO BOTH EYES BID, Disp: , Rfl: 3 .  BIOTIN PO, Take 10,000 mg by mouth daily., Disp: , Rfl:  .  Black Cohosh 540 MG CAPS, Take 540 mg by mouth daily. , Disp: , Rfl:  .  FIBER PO, Take 5 tablets by mouth 2 (two) times daily., Disp: , Rfl:  .  levonorgestrel (MIRENA) 20 MCG/24HR IUD, 1 each by Intrauterine route once. ,  Disp: , Rfl:  .  Melatonin 5 MG CAPS, Take 10 mg by mouth at bedtime. , Disp: , Rfl:  .  meloxicam (MOBIC) 15 MG tablet, Take 1 tablet (15 mg total) by mouth daily., Disp: 90 tablet, Rfl: 1 .  Multiple Vitamin (MULTI-VITAMIN DAILY PO), Take 1 tablet by mouth daily with breakfast. Women's, Disp: , Rfl:  .  Omega-3 Fatty Acids (FISH OIL) 1000 MG CAPS, Take 1,000 mg by mouth 2 (two) times daily., Disp: , Rfl:  .  Red Yeast Rice Extract 600 MG CAPS, Take 600 mg by mouth 2 (two) times daily. , Disp: , Rfl:  .  valsartan-hydrochlorothiazide (DIOVAN-HCT) 320-25 MG tablet, TAKE 1 TABLET DAILY, Disp: 90 tablet, Rfl: 0  Allergies  Allergen Reactions  . Percocet [Oxycodone-Acetaminophen] Anaphylaxis    bronchospasms  . Adhesive [Tape] Itching  . Allergy Relief  [Chlorpheniramine Maleate]     Heart flutters  . Betadine  [Povidone Iodine]     blisters blisters  . Other     Betadine=blisters skin  ,Cashew Nuts,sunflower seeds-swelling and itching  . Penicillins Hives    Has patient had a PCN reaction causing immediate rash, facial/tongue/throat swelling, SOB or lightheadedness with hypotension: no Has patient had a PCN reaction causing severe rash involving mucus membranes or skin necrosis: no Has patient had a PCN reaction that required hospitalization happened in the hospital Has patient had a PCN reaction occurring within the last 10 years: no If all of the above answers are "NO", then may proceed with Cephalosporin use.   . Benadryl [Diphenhydramine Hcl] Rash     Review of Systems  Respiratory: Negative for shortness of breath.   Cardiovascular: Negative for chest pain and palpitations.  Musculoskeletal: Positive for arthritis.  Neurological: Negative for headaches.  Psychiatric/Behavioral: The patient is nervous/anxious.     Objective  Filed Vitals:   06/16/15 1022  BP: 120/76  Pulse: 88  Temp: 98.5 F (36.9 C)  TempSrc: Oral  Resp: 17  Height: 5\' 1"  (1.549 m)  Weight: 218 lb  (98.884 kg)  SpO2: 98%    Physical Exam  Constitutional: She is oriented to person, place, and time and well-developed, well-nourished, and in no distress.  HENT:  Head: Normocephalic and atraumatic.  Cardiovascular: Normal rate and regular rhythm.   Pulmonary/Chest: Effort normal and breath sounds normal.  Abdominal: Soft. Bowel sounds are normal.  Neurological: She is alert and oriented to person, place, and time.  Nursing note and vitals reviewed.      Assessment & Plan  1. Essential hypertension Blood pressure stable and controlled on present and evidence of therapy - amLODipine (NORVASC) 10 MG tablet; Take  1 tablet (10 mg total) by mouth daily.  Dispense: 90 tablet; Refill: 1 - valsartan-hydrochlorothiazide (DIOVAN-HCT) 320-25 MG tablet; Take 1 tablet by mouth daily.  Dispense: 90 tablet; Refill: 1  2. Generalized anxiety disorder Symptoms of anxiety are stable and controlled on alprazolam taken twice daily as needed - ALPRAZolam (XANAX) 0.25 MG tablet; Take 1 tablet (0.25 mg total) by mouth 2 (two) times daily as needed for anxiety.  Dispense: 60 tablet; Refill: 0  3. Primary osteoarthritis of both knees DC meloxicam, patient recently diagnosed with gastritis and gastric ulcer. We'll change to tramadol 50 mg every 8 hours as needed for relief. - traMADol (ULTRAM) 50 MG tablet; Take 1 tablet (50 mg total) by mouth every 8 (eight) hours as needed.  Dispense: 30 tablet; Refill: 0   Lener Ventresca Asad [facility] 06/16/2015 10:31 AM

## 2015-07-14 ENCOUNTER — Other Ambulatory Visit: Payer: Self-pay | Admitting: Family Medicine

## 2015-07-14 DIAGNOSIS — M170 Bilateral primary osteoarthritis of knee: Secondary | ICD-10-CM

## 2015-08-01 ENCOUNTER — Telehealth: Payer: Self-pay | Admitting: Family Medicine

## 2015-08-01 DIAGNOSIS — M170 Bilateral primary osteoarthritis of knee: Secondary | ICD-10-CM

## 2015-08-01 MED ORDER — TRAMADOL HCL 50 MG PO TABS: 50.0000 mg | ORAL_TABLET | Freq: Three times a day (TID) | ORAL | Status: DC | PRN

## 2015-08-01 NOTE — Telephone Encounter (Signed)
PT IS NEEDING TRAMADOL REFILLED. PHARM IS [facility]  [place]

## 2015-08-01 NOTE — Telephone Encounter (Signed)
Refill for tramadol has been sent to patient's pharmacy. Before the next refill, patient must schedule an appointment to complete and signed the controlled substances agreement

## 2015-08-10 ENCOUNTER — Other Ambulatory Visit: Payer: Self-pay | Admitting: Family Medicine

## 2015-08-10 DIAGNOSIS — M170 Bilateral primary osteoarthritis of knee: Secondary | ICD-10-CM

## 2015-08-12 ENCOUNTER — Other Ambulatory Visit: Payer: Self-pay | Admitting: Family Medicine

## 2015-08-12 DIAGNOSIS — Z1231 Encounter for screening mammogram for malignant neoplasm of breast: Secondary | ICD-10-CM

## 2015-09-16 ENCOUNTER — Ambulatory Visit: Payer: Managed Care, Other (non HMO) | Admitting: Family Medicine

## 2015-09-22 ENCOUNTER — Ambulatory Visit (INDEPENDENT_AMBULATORY_CARE_PROVIDER_SITE_OTHER): Payer: Managed Care, Other (non HMO) | Admitting: Family Medicine

## 2015-09-22 ENCOUNTER — Encounter: Payer: Self-pay | Admitting: Family Medicine

## 2015-09-22 VITALS — BP 120/78 | HR 74 | Temp 98.3°F | Resp 18 | Ht 61.0 in | Wt 202.1 lb

## 2015-09-22 DIAGNOSIS — E7800 Pure hypercholesterolemia, unspecified: Secondary | ICD-10-CM | POA: Diagnosis not present

## 2015-09-22 DIAGNOSIS — F411 Generalized anxiety disorder: Secondary | ICD-10-CM

## 2015-09-22 DIAGNOSIS — I10 Essential (primary) hypertension: Secondary | ICD-10-CM | POA: Diagnosis not present

## 2015-09-22 LAB — COMPREHENSIVE METABOLIC PANEL
ALK PHOS: 65 U/L (ref 33–130)
ALT: 20 U/L (ref 6–29)
AST: 19 U/L (ref 10–35)
Albumin: 4.3 g/dL (ref 3.6–5.1)
BILIRUBIN TOTAL: 0.8 mg/dL (ref 0.2–1.2)
BUN: 17 mg/dL (ref 7–25)
CALCIUM: 9.7 mg/dL (ref 8.6–10.4)
CO2: 30 mmol/L (ref 20–31)
Chloride: 100 mmol/L (ref 98–110)
Creat: 0.86 mg/dL (ref 0.50–1.05)
Glucose, Bld: 104 mg/dL — ABNORMAL HIGH (ref 65–99)
POTASSIUM: 3.8 mmol/L (ref 3.5–5.3)
Sodium: 142 mmol/L (ref 135–146)
TOTAL PROTEIN: 7.1 g/dL (ref 6.1–8.1)

## 2015-09-22 LAB — LIPID PANEL
CHOLESTEROL: 199 mg/dL (ref 125–200)
HDL: 58 mg/dL (ref >=46)
LDL Cholesterol: 118 mg/dL (ref <130)
TRIGLYCERIDES: 114 mg/dL (ref <150)
Total CHOL/HDL Ratio: 3.4 Ratio (ref <=5.0)
VLDL: 23 mg/dL (ref <30)

## 2015-09-22 MED ORDER — ALPRAZOLAM 0.25 MG PO TABS: 0.2500 mg | ORAL_TABLET | Freq: Two times a day (BID) | ORAL | 0 refills | Status: DC | PRN

## 2015-09-22 MED ORDER — VALSARTAN-HYDROCHLOROTHIAZIDE 320-25 MG PO TABS: 1.0000 | ORAL_TABLET | Freq: Every day | ORAL | 1 refills | Status: DC

## 2015-09-22 MED ORDER — AMLODIPINE BESYLATE 10 MG PO TABS: 10.0000 mg | ORAL_TABLET | Freq: Every day | ORAL | 1 refills | Status: DC

## 2015-09-22 NOTE — Progress Notes (Signed)
Name: Taylor GibsonJustina F Hurst   MRN: [id number]    DOB: 06/07/1965   Date:09/22/2015       Progress Note  Subjective  Chief Complaint  Chief Complaint  Patient presents with  . Hyperlipidemia    pt here for 3 month follow up  . Hypertension  . Anxiety    Hyperlipidemia  This is a chronic problem. The problem is uncontrolled. Recent lipid tests were reviewed and are high. Exacerbating diseases include obesity. Associated symptoms include shortness of breath. Pertinent negatives include no chest pain. Treatments tried: Omega-3 fatty acids, Red Yeast Rice. There are no compliance problems.   Hypertension  This is a chronic problem. The problem is unchanged. Associated symptoms include anxiety and shortness of breath. Pertinent negatives include no blurred vision, chest pain, headaches or palpitations. Past treatments include angiotensin blockers, diuretics and calcium channel blockers. There is no history of kidney disease, CAD/MI or CVA.  Anxiety  Presents for follow-up visit. Symptoms include excessive worry, nervous/anxious behavior and shortness of breath. Patient reports no chest pain, insomnia, irritability or palpitations. The severity of symptoms is mild.     Past Medical History:  Diagnosis Date  . Achilles tendinitis 09/23/2014   Right leg   . Arthritis   . Chronic cholecystitis 05/30/2011  . GERD (gastroesophageal reflux disease)    hx of pt currently has no issues since gallbladder surgery   . Heart murmur   . Hepatitis    2013 chronic active hepatitis during gallbladder issues  . History of blood transfusion    [id number]  . Hypertension   . Nausea   . Osteomyelitis (HCC)    1983 right leg;1988 left leg  . Torn rotator cuff 09/23/2014   Right side     Past Surgical History:  Procedure Laterality Date  . CHOLECYSTECTOMY  07/26/11  . INTRAOPERATIVE CHOLANGIOGRAM  07/26/2011   Procedure: INTRAOPERATIVE CHOLANGIOGRAM;  Surgeon: Ardeth SportsmanSteven C. Gross, MD;  Location: [facility];  Service: General;   Laterality: N/A;  . LIVER BIOPSY  07/26/2011   Procedure: LIVER BIOPSY;  Surgeon: Ardeth SportsmanSteven C. Gross, MD;  Location: [facility];  Service: General;  Laterality: N/A;  core liver biopsy   . osteomilitis     right and left leg/surgery; right 1983; left 1988  . osteomylitis  [id number]   right leg, left leg  . SHOULDER OPEN ROTATOR CUFF REPAIR Right 11/18/2014   Procedure: RIGHT SHOULDER MINI OPEN ROTATOR CUFF REPAIR SUBACROMIAL DECOMPRESSION  ;  Surgeon: Jene EveryJeffrey Beane, MD;  Location: [facility];  Service: Orthopedics;  Laterality: Right;   Family History  Problem Relation Age of Onset  . COPD Mother   . Hypertension Mother   . Hyperlipidemia Mother   . Alcohol abuse Father   . Cancer Maternal Aunt     breast    Social History   Social History  . Marital status: Married    Spouse name: N/A  . Number of children: N/A  . Years of education: N/A   Occupational History  . Not on file.   Social History Main Topics  . Smoking status: Never Smoker  . Smokeless tobacco: Never Used  . Alcohol use 0.0 oz/week     Comment: occas  . Drug use: No  . Sexual activity: Yes    Birth control/ protection: IUD   Other Topics Concern  . Not on file   Social History Narrative  . No narrative on file     Current Outpatient Prescriptions:  .  ALPRAZolam (XANAX) 0.25  MG tablet, Take 1 tablet (0.25 mg total) by mouth 2 (two) times daily as needed for anxiety., Disp: 60 tablet, Rfl: 0 .  amLODipine (NORVASC) 10 MG tablet, Take 1 tablet (10 mg total) by mouth daily., Disp: 90 tablet, Rfl: 1 .  b complex vitamins tablet, Take 1 tablet by mouth daily., Disp: , Rfl:  .  BEPREVE 1.5 % SOLN, INSTILL 1 DROP INTO BOTH EYES BID, Disp: , Rfl: 3 .  BIOTIN PO, Take 10,000 mg by mouth daily., Disp: , Rfl:  .  Black Cohosh 540 MG CAPS, Take 540 mg by mouth daily. , Disp: , Rfl:  .  FIBER PO, Take 5 tablets by mouth 2 (two) times daily., Disp: , Rfl:  .  levonorgestrel (MIRENA) 20 MCG/24HR IUD, 1 each by Intrauterine  route once. , Disp: , Rfl:  .  Melatonin 5 MG CAPS, Take 10 mg by mouth at bedtime. , Disp: , Rfl:  .  Multiple Vitamin (MULTI-VITAMIN DAILY PO), Take 1 tablet by mouth daily with breakfast. Women's, Disp: , Rfl:  .  Omega-3 Fatty Acids (FISH OIL) 1000 MG CAPS, Take 1,000 mg by mouth 2 (two) times daily., Disp: , Rfl:  .  omeprazole (PRILOSEC) 40 MG capsule, , Disp: , Rfl:  .  Red Yeast Rice Extract 600 MG CAPS, Take 600 mg by mouth 2 (two) times daily. , Disp: , Rfl:  .  traMADol (ULTRAM) 50 MG tablet, Take 1 tablet (50 mg total) by mouth every 8 (eight) hours as needed., Disp: 90 tablet, Rfl: 0 .  valsartan-hydrochlorothiazide (DIOVAN-HCT) 320-25 MG tablet, Take 1 tablet by mouth daily., Disp: 90 tablet, Rfl: 1  Allergies  Allergen Reactions  . Percocet [Oxycodone-Acetaminophen] Anaphylaxis    bronchospasms  . Adhesive [Tape] Itching  . Allergy Relief  [Chlorpheniramine Maleate]     Heart flutters  . Betadine  [Povidone Iodine]     blisters blisters  . Other     Betadine=blisters skin  ,Cashew Nuts,sunflower seeds-swelling and itching  . Penicillins Hives    Has patient had a PCN reaction causing immediate rash, facial/tongue/throat swelling, SOB or lightheadedness with hypotension: no Has patient had a PCN reaction causing severe rash involving mucus membranes or skin necrosis: no Has patient had a PCN reaction that required hospitalization happened in the hospital Has patient had a PCN reaction occurring within the last 10 years: no If all of the above answers are "NO", then may proceed with Cephalosporin use.      Review of Systems  Constitutional: Negative for irritability.  Eyes: Negative for blurred vision.  Respiratory: Positive for shortness of breath.   Cardiovascular: Negative for chest pain and palpitations.  Musculoskeletal: Positive for joint pain.  Neurological: Negative for headaches.  Psychiatric/Behavioral: The patient is nervous/anxious. The patient does not  have insomnia.     Objective  Vitals:   09/22/15 1053  BP: 120/78  Pulse: 74  Resp: 18  Temp: 98.3 F (36.8 C)  SpO2: 98%  Weight: 202 lb 1 oz (91.7 kg)  Height: 5\' 1"  (1.549 m)    Physical Exam  Constitutional: She is oriented to person, place, and time and well-developed, well-nourished, and in no distress.  HENT:  Head: Normocephalic and atraumatic.  Cardiovascular: Normal rate, regular rhythm and normal heart sounds.   No murmur heard. Pulmonary/Chest: Effort normal and breath sounds normal. She has no wheezes.  Abdominal: Soft. Bowel sounds are normal. There is no tenderness.  Neurological: She is alert and oriented to  person, place, and time.  Psychiatric: Mood, memory, affect and judgment normal.  Nursing note and vitals reviewed.      Assessment & Plan  1. Essential hypertension BP stable on present antihypertensive therapy - amLODipine (NORVASC) 10 MG tablet; Take 1 tablet (10 mg total) by mouth daily.  Dispense: 90 tablet; Refill: 1 - valsartan-hydrochlorothiazide (DIOVAN-HCT) 320-25 MG tablet; Take 1 tablet by mouth daily.  Dispense: 90 tablet; Refill: 1  2. Generalized anxiety disorder Stable, continue alprazolam as needed - ALPRAZolam (XANAX) 0.25 MG tablet; Take 1 tablet (0.25 mg total) by mouth 2 (two) times daily as needed for anxiety.  Dispense: 60 tablet; Refill: 0  3. Hypercholesterolemia Elevated total and LDL cholesterol, repeat FLP and consider starting on statin therapy - Lipid Profile - Comprehensive Metabolic Panel (CMET)   Simmone Cape Asad [facility] 09/22/2015 11:13 AM

## 2015-09-23 ENCOUNTER — Ambulatory Visit
Admission: RE | Admit: 2015-09-23 | Discharge: 2015-09-23 | Disposition: A | Discharge status: Home / Self Care | Payer: Managed Care, Other (non HMO) | Source: Ambulatory Visit | Attending: Family Medicine | Admitting: Family Medicine

## 2015-09-23 DIAGNOSIS — Z1231 Encounter for screening mammogram for malignant neoplasm of breast: Secondary | ICD-10-CM

## 2015-09-29 ENCOUNTER — Encounter: Payer: Managed Care, Other (non HMO) | Admitting: Obstetrics and Gynecology

## 2015-10-06 ENCOUNTER — Ambulatory Visit (INDEPENDENT_AMBULATORY_CARE_PROVIDER_SITE_OTHER): Payer: Managed Care, Other (non HMO) | Admitting: Obstetrics and Gynecology

## 2015-10-06 ENCOUNTER — Encounter: Payer: Self-pay | Admitting: Obstetrics and Gynecology

## 2015-10-06 VITALS — BP 117/79 | HR 69 | Ht 61.0 in | Wt 202.4 lb

## 2015-10-06 DIAGNOSIS — Z23 Encounter for immunization: Secondary | ICD-10-CM

## 2015-10-06 DIAGNOSIS — Z01419 Encounter for gynecological examination (general) (routine) without abnormal findings: Secondary | ICD-10-CM

## 2015-10-06 DIAGNOSIS — IMO0001 Reserved for inherently not codable concepts without codable children: Secondary | ICD-10-CM

## 2015-10-06 NOTE — Patient Instructions (Signed)
Health Maintenance, Female Adopting a healthy lifestyle and getting preventive care can go a long way to promote health and wellness. Talk with your health care provider about what schedule of regular examinations is right for you. This is a good chance for you to check in with your provider about disease prevention and staying healthy. In between checkups, there are plenty of things you can do on your own. Experts have done a lot of research about which lifestyle changes and preventive measures are most likely to keep you healthy. Ask your health care provider for more information. WEIGHT AND DIET  Eat a healthy diet  Be sure to include plenty of vegetables, fruits, low-fat dairy products, and lean protein.  Do not eat a lot of foods high in solid fats, added sugars, or salt.  Get regular exercise. This is one of the most important things you can do for your health.  Most adults should exercise for at least 150 minutes each week. The exercise should increase your heart rate and make you sweat (moderate-intensity exercise).  Most adults should also do strengthening exercises at least twice a week. This is in addition to the moderate-intensity exercise.  Maintain a healthy weight  Body mass index (BMI) is a measurement that can be used to identify possible weight problems. It estimates body fat based on height and weight. Your health care provider can help determine your BMI and help you achieve or maintain a healthy weight.  For females 20 years of age and older:   A BMI below 18.5 is considered underweight.  A BMI of 18.5 to 24.9 is normal.  A BMI of 25 to 29.9 is considered overweight.  A BMI of 30 and above is considered obese.  Watch levels of cholesterol and blood lipids  You should start having your blood tested for lipids and cholesterol at 50 years of age, then have this test every 5 years.  You may need to have your cholesterol levels checked more often if:  Your lipid  or cholesterol levels are high.  You are older than 50 years of age.  You are at high risk for heart disease.  CANCER SCREENING   Lung Cancer  Lung cancer screening is recommended for adults 55-80 years old who are at high risk for lung cancer because of a history of smoking.  A yearly low-dose CT scan of the lungs is recommended for people who:  Currently smoke.  Have quit within the past 15 years.  Have at least a 30-pack-year history of smoking. A pack year is smoking an average of one pack of cigarettes a day for 1 year.  Yearly screening should continue until it has been 15 years since you quit.  Yearly screening should stop if you develop a health problem that would prevent you from having lung cancer treatment.  Breast Cancer  Practice breast self-awareness. This means understanding how your breasts normally appear and feel.  It also means doing regular breast self-exams. Let your health care provider know about any changes, no matter how small.  If you are in your 20s or 30s, you should have a clinical breast exam (CBE) by a health care provider every 1-3 years as part of a regular health exam.  If you are 40 or older, have a CBE every year. Also consider having a breast X-ray (mammogram) every year.  If you have a family history of breast cancer, talk to your health care provider about genetic screening.  If you   are at high risk for breast cancer, talk to your health care provider about having an MRI and a mammogram every year.  Breast cancer gene (BRCA) assessment is recommended for women who have family members with BRCA-related cancers. BRCA-related cancers include:  Breast.  Ovarian.  Tubal.  Peritoneal cancers.  Results of the assessment will determine the need for genetic counseling and BRCA1 and BRCA2 testing. Cervical Cancer Your health care provider may recommend that you be screened regularly for cancer of the pelvic organs (ovaries, uterus, and  vagina). This screening involves a pelvic examination, including checking for microscopic changes to the surface of your cervix (Pap test). You may be encouraged to have this screening done every 3 years, beginning at age 21.  For women ages 30-65, health care providers may recommend pelvic exams and Pap testing every 3 years, or they may recommend the Pap and pelvic exam, combined with testing for human papilloma virus (HPV), every 5 years. Some types of HPV increase your risk of cervical cancer. Testing for HPV may also be done on women of any age with unclear Pap test results.  Other health care providers may not recommend any screening for nonpregnant women who are considered low risk for pelvic cancer and who do not have symptoms. Ask your health care provider if a screening pelvic exam is right for you.  If you have had past treatment for cervical cancer or a condition that could lead to cancer, you need Pap tests and screening for cancer for at least 20 years after your treatment. If Pap tests have been discontinued, your risk factors (such as having a new sexual partner) need to be reassessed to determine if screening should resume. Some women have medical problems that increase the chance of getting cervical cancer. In these cases, your health care provider may recommend more frequent screening and Pap tests. Colorectal Cancer  This type of cancer can be detected and often prevented.  Routine colorectal cancer screening usually begins at 50 years of age and continues through 50 years of age.  Your health care provider may recommend screening at an earlier age if you have risk factors for colon cancer.  Your health care provider may also recommend using home test kits to check for hidden blood in the stool.  A small camera at the end of a tube can be used to examine your colon directly (sigmoidoscopy or colonoscopy). This is done to check for the earliest forms of colorectal  cancer.  Routine screening usually begins at age 50.  Direct examination of the colon should be repeated every 5-10 years through 50 years of age. However, you may need to be screened more often if early forms of precancerous polyps or small growths are found. Skin Cancer  Check your skin from head to toe regularly.  Tell your health care provider about any new moles or changes in moles, especially if there is a change in a mole's shape or color.  Also tell your health care provider if you have a mole that is larger than the size of a pencil eraser.  Always use sunscreen. Apply sunscreen liberally and repeatedly throughout the day.  Protect yourself by wearing long sleeves, pants, a wide-brimmed hat, and sunglasses whenever you are outside. HEART DISEASE, DIABETES, AND HIGH BLOOD PRESSURE   High blood pressure causes heart disease and increases the risk of stroke. High blood pressure is more likely to develop in:  People who have blood pressure in the high end   of the normal range (130-139/85-89 mm Hg).  People who are overweight or obese.  People who are African American.  If you are 38-23 years of age, have your blood pressure checked every 3-5 years. If you are 61 years of age or older, have your blood pressure checked every year. You should have your blood pressure measured twice--once when you are at a hospital or clinic, and once when you are not at a hospital or clinic. Record the average of the two measurements. To check your blood pressure when you are not at a hospital or clinic, you can use:  An automated blood pressure machine at a pharmacy.  A home blood pressure monitor.  If you are between 45 years and 39 years old, ask your health care provider if you should take aspirin to prevent strokes.  Have regular diabetes screenings. This involves taking a blood sample to check your fasting blood sugar level.  If you are at a normal weight and have a low risk for diabetes,  have this test once every three years after 50 years of age.  If you are overweight and have a high risk for diabetes, consider being tested at a younger age or more often. PREVENTING INFECTION  Hepatitis B  If you have a higher risk for hepatitis B, you should be screened for this virus. You are considered at high risk for hepatitis B if:  You were born in a country where hepatitis B is common. Ask your health care provider which countries are considered high risk.  Your parents were born in a high-risk country, and you have not been immunized against hepatitis B (hepatitis B vaccine).  You have HIV or AIDS.  You use needles to inject street drugs.  You live with someone who has hepatitis B.  You have had sex with someone who has hepatitis B.  You get hemodialysis treatment.  You take certain medicines for conditions, including cancer, organ transplantation, and autoimmune conditions. Hepatitis C  Blood testing is recommended for:  Everyone born from [birth year] through 1965.  Anyone with known risk factors for hepatitis C. Sexually transmitted infections (STIs)  You should be screened for sexually transmitted infections (STIs) including gonorrhea and chlamydia if:  You are sexually active and are younger than 50 years of age.  You are older than 50 years of age and your health care provider tells you that you are at risk for this type of infection.  Your sexual activity has changed since you were last screened and you are at an increased risk for chlamydia or gonorrhea. Ask your health care provider if you are at risk.  If you do not have HIV, but are at risk, it may be recommended that you take a prescription medicine daily to prevent HIV infection. This is called pre-exposure prophylaxis (PrEP). You are considered at risk if:  You are sexually active and do not regularly use condoms or know the HIV status of your partner(s).  You take drugs by injection.  You are sexually  active with a partner who has HIV. Talk with your health care provider about whether you are at high risk of being infected with HIV. If you choose to begin PrEP, you should first be tested for HIV. You should then be tested every 3 months for as long as you are taking PrEP.  PREGNANCY   If you are premenopausal and you may become pregnant, ask your health care provider about preconception counseling.  If you may  become pregnant, take 400 to 800 micrograms (mcg) of folic acid every day.  If you want to prevent pregnancy, talk to your health care provider about birth control (contraception). OSTEOPOROSIS AND MENOPAUSE   Osteoporosis is a disease in which the bones lose minerals and strength with aging. This can result in serious bone fractures. Your risk for osteoporosis can be identified using a bone density scan.  If you are 61 years of age or older, or if you are at risk for osteoporosis and fractures, ask your health care provider if you should be screened.  Ask your health care provider whether you should take a calcium or vitamin D supplement to lower your risk for osteoporosis.  Menopause may have certain physical symptoms and risks.  Hormone replacement therapy may reduce some of these symptoms and risks. Talk to your health care provider about whether hormone replacement therapy is right for you.  HOME CARE INSTRUCTIONS   Schedule regular health, dental, and eye exams.  Stay current with your immunizations.   Do not use any tobacco products including cigarettes, chewing tobacco, or electronic cigarettes.  If you are pregnant, do not drink alcohol.  If you are breastfeeding, limit how much and how often you drink alcohol.  Limit alcohol intake to no more than 1 drink per day for nonpregnant women. One drink equals 12 ounces of beer, 5 ounces of wine, or 1 ounces of hard liquor.  Do not use street drugs.  Do not share needles.  Ask your health care provider for help if  you need support or information about quitting drugs.  Tell your health care provider if you often feel depressed.  Tell your health care provider if you have ever been abused or do not feel safe at home.   This information is not intended to replace advice given to you by your health care provider. Make sure you discuss any questions you have with your health care provider.   Document Released: 07/17/2010 Document Revised: 01/22/2014 Document Reviewed: 12/03/2012 Elsevier Interactive Patient Education [employer].

## 2015-10-06 NOTE — Progress Notes (Signed)
GYNECOLOGY CLINIC PROGRESS NOTE  Subjective:    Taylor Hurst is a 50 y.o. married 28P2002 female who presents for an annual exam. The patient has no complaints today.The patient is sexually active.  The patient wears seatbelts: yes. The patient participates in regular exercise: no. Has the patient ever been transfused or tattooed?: tattooed. The patient reports that there is not domestic violence in her life.    Menstrual History: Menarche age: [age] No LMP recorded. Patient is not currently having periods (Reason: IUD).  Denies h/o abnormal menses or STIs. Denies h/o abnormal pap smears.  Last pap: approximate date 08/2013 and was normal Last mammogram: approximate date 09/2015, normal.  Last Colonoscopy: 05/2015, normal. Also had endoscopy which noted small gastric ulcer.   Past Medical History:  Diagnosis Date  . Achilles tendinitis 09/23/2014   Right leg   . Arthritis   . Chronic cholecystitis 05/30/2011  . Gastric ulcer   . GERD (gastroesophageal reflux disease)    hx of pt currently has no issues since gallbladder surgery   . Heart murmur   . Hepatitis    2013 chronic active hepatitis during gallbladder issues  . History of blood transfusion    19983  . Hypertension   . Nausea   . Osteomyelitis (HCC)    1983 right leg;1988 left leg  . Torn rotator cuff 09/23/2014   Right side     Past Surgical History:  Procedure Laterality Date  . CHOLECYSTECTOMY  07/26/11  . INTRAOPERATIVE CHOLANGIOGRAM  07/26/2011   Procedure: INTRAOPERATIVE CHOLANGIOGRAM;  Surgeon: Ardeth SportsmanSteven C. Gross, MD;  Location: [facility];  Service: General;  Laterality: N/A;  . LIVER BIOPSY  07/26/2011   Procedure: LIVER BIOPSY;  Surgeon: Ardeth SportsmanSteven C. Gross, MD;  Location: [facility];  Service: General;  Laterality: N/A;  core liver biopsy   . osteomilitis     right and left leg/surgery; right 1983; left 1988  . osteomylitis  [id number]   right leg, left leg  . SHOULDER OPEN ROTATOR CUFF REPAIR Right 11/18/2014   Procedure:  RIGHT SHOULDER MINI OPEN ROTATOR CUFF REPAIR SUBACROMIAL DECOMPRESSION  ;  Surgeon: Jene EveryJeffrey Beane, MD;  Location: [facility];  Service: Orthopedics;  Laterality: Right;    Family History  Problem Relation Age of Onset  . COPD Mother   . Hypertension Mother   . Hyperlipidemia Mother   . Alcohol abuse Father   . Cancer Maternal Aunt     breast    Social History   Social History  . Marital status: Married    Spouse name: N/A  . Number of children: N/A  . Years of education: N/A   Occupational History  . Not on file.   Social History Main Topics  . Smoking status: Never Smoker  . Smokeless tobacco: Never Used  . Alcohol use 0.0 oz/week     Comment: occas  . Drug use: No  . Sexual activity: Yes    Birth control/ protection: IUD     Comment: Mirena   Other Topics Concern  . Not on file   Social History Narrative  . No narrative on file    Current Outpatient Prescriptions on File Prior to Visit  Medication Sig Dispense Refill  . ALPRAZolam (XANAX) 0.25 MG tablet Take 1 tablet (0.25 mg total) by mouth 2 (two) times daily as needed for anxiety. 60 tablet 0  . amLODipine (NORVASC) 10 MG tablet Take 1 tablet (10 mg total) by mouth daily. 90 tablet 1  .  b complex vitamins tablet Take 1 tablet by mouth daily.    Marland Kitchen BEPREVE 1.5 % SOLN INSTILL 1 DROP INTO BOTH EYES BID  3  . BIOTIN PO Take 10,000 mg by mouth daily.    . Black Cohosh 540 MG CAPS Take 540 mg by mouth daily.     Marland Kitchen FIBER PO Take 5 tablets by mouth 2 (two) times daily.    Marland Kitchen levonorgestrel (MIRENA) 20 MCG/24HR IUD 1 each by Intrauterine route once.     . Melatonin 5 MG CAPS Take 10 mg by mouth at bedtime.     . Multiple Vitamin (MULTI-VITAMIN DAILY PO) Take 1 tablet by mouth daily with breakfast. Women's    . Omega-3 Fatty Acids (FISH OIL) 1000 MG CAPS Take 1,000 mg by mouth 2 (two) times daily.    Marland Kitchen omeprazole (PRILOSEC) 40 MG capsule     . Red Yeast Rice Extract 600 MG CAPS Take 600 mg by mouth 2 (two) times daily.       . traMADol (ULTRAM) 50 MG tablet Take 1 tablet (50 mg total) by mouth every 8 (eight) hours as needed. 90 tablet 0  . valsartan-hydrochlorothiazide (DIOVAN-HCT) 320-25 MG tablet Take 1 tablet by mouth daily. 90 tablet 1   No current facility-administered medications on file prior to visit.     Allergies  Allergen Reactions  . Percocet [Oxycodone-Acetaminophen] Anaphylaxis    bronchospasms  . Adhesive [Tape] Itching  . Allergy Relief  [Chlorpheniramine Maleate]     Heart flutters  . Betadine  [Povidone Iodine]     blisters blisters  . Other     Betadine=blisters skin  ,Cashew Nuts,sunflower seeds-swelling and itching  . Penicillins Hives    Has patient had a PCN reaction causing immediate rash, facial/tongue/throat swelling, SOB or lightheadedness with hypotension: no Has patient had a PCN reaction causing severe rash involving mucus membranes or skin necrosis: no Has patient had a PCN reaction that required hospitalization happened in the hospital Has patient had a PCN reaction occurring within the last 10 years: no If all of the above answers are "NO", then may proceed with Cephalosporin use.      Review of Systems Constitutional: negative for chills, fatigue, fevers and sweats Eyes: negative for irritation, redness and visual disturbance Ears, nose, mouth, throat, and face: negative for hearing loss, nasal congestion, snoring and tinnitus Respiratory: negative for asthma, cough, sputum Cardiovascular: negative for chest pain, dyspnea, exertional chest pressure/discomfort, irregular heart beat, palpitations and syncope Gastrointestinal: negative for abdominal pain, change in bowel habits, nausea and vomiting Genitourinary: negative for abnormal menstrual periods, genital lesions, sexual problems and vaginal discharge, dysuria and urinary incontinence Integument/breast: negative for breast lump, breast tenderness and nipple discharge Hematologic/lymphatic: negative for bleeding  and easy bruising Musculoskeletal:negative for back pain and muscle weakness Neurological: negative for dizziness, headaches, vertigo and weakness Endocrine: negative for diabetic symptoms including polydipsia, polyuria and skin dryness Allergic/Immunologic: negative for hay fever and urticaria    Objective:   Blood pressure 117/79, pulse 69, height 5\' 1"  (1.549 m), weight 202 lb 6.4 oz (91.8 kg). Body mass index is 38.24 kg/m.   General Appearance:    Alert, cooperative, no distress, appears stated age; moderately obese  Head:    Normocephalic, without obvious abnormality, atraumatic  Eyes:    PERRL, conjunctiva/corneas clear, EOM's intact, both eyes  Ears:    Normal external ear canals, both ears  Nose:   Nares normal, septum midline, mucosa normal, no drainage or sinus tenderness  Throat:   Lips, mucosa, and tongue normal; teeth and gums normal  Neck:   Supple, symmetrical, trachea midline, no adenopathy; thyroid: no enlargement/tenderness/nodules; no carotid bruit or JVD  Back:     Symmetric, no curvature, ROM normal, no CVA tenderness  Lungs:     Clear to auscultation bilaterally, respirations unlabored  Chest Wall:    No tenderness or deformity   Heart:    Regular rate and rhythm, S1 and S2 normal, no murmur, rub or gallop  Breast Exam:    No tenderness, masses, or nipple abnormality  Abdomen:     Soft, non-tender, bowel sounds active all four quadrants, no masses, no organomegaly.   Genitalia:    Pelvic:external genitalia normal. Vagina without lesions, discharge, or tenderness; rectovaginal septum  normal. Cervix normal in appearance, no cervical motion tenderness.  Uterus normal size and shape, mobile, nontender.  No adnexal masses or tenderness.   Rectal:    Normal external sphincter.  No hemorrhoids appreciated. Internal exam not done.   Extremities:   Extremities normal, atraumatic, no cyanosis or edema  Pulses:   2+ and symmetric all extremities  Skin:   Skin color, texture,  turgor normal, no rashes or lesions  Lymph nodes:   Cervical, supraclavicular, and axillary nodes normal  Neurologic:   CNII-XII intact, normal strength, sensation and reflexes throughout     Labs:  Office Visit on 09/22/2015  Component Date Value Ref Range Status  . Cholesterol 09/22/2015 199  125 - 200 mg/dL Final  . Triglycerides 09/22/2015 114  <150 mg/dL Final  . HDL 16/10/9602 58  >=46 mg/dL Final  . Total CHOL/HDL Ratio 09/22/2015 3.4  <=5.4 Ratio Final  . VLDL 09/22/2015 23  <30 mg/dL Final  . LDL Cholesterol 09/22/2015 118  <130 mg/dL Final   Comment:   Total Cholesterol/HDL Ratio:CHD Risk                        Coronary Heart Disease Risk Table                                        Men       Women          1/2 Average Risk              3.4        3.3              Average Risk              5.0        4.4           2X Average Risk              9.6        7.1           3X Average Risk             23.4       11.0 Use the calculated Patient Ratio above and the CHD Risk table  to determine the patient's CHD Risk.   . Sodium 09/22/2015 142  135 - 146 mmol/L Final  . Potassium 09/22/2015 3.8  3.5 - 5.3 mmol/L Final  . Chloride 09/22/2015 100  98 - 110 mmol/L Final  . CO2 09/22/2015 30  20 - 31 mmol/L Final  . Glucose, Bld 09/22/2015 104* 65 -  99 mg/dL Final  . BUN 91/47/8295 17  7 - 25 mg/dL Final  . Creat 62/13/0865 0.86  0.50 - 1.05 mg/dL Final   Comment:   For patients > or = 50 years of age: The upper reference limit for Creatinine is approximately 13% higher for people identified as African-American.     . Total Bilirubin 09/22/2015 0.8  0.2 - 1.2 mg/dL Final  . Alkaline Phosphatase 09/22/2015 65  33 - 130 U/L Final  . AST 09/22/2015 19  10 - 35 U/L Final  . ALT 09/22/2015 20  6 - 29 U/L Final  . Total Protein 09/22/2015 7.1  6.1 - 8.1 g/dL Final  . Albumin 78/46/9629 4.3  3.6 - 5.1 g/dL Final  . Calcium 52/84/1324 9.7  8.6 - 10.4 mg/dL Final    Lab Results    Component Value Date   WBC 12.2 (H) 11/19/2014   HGB 11.7 (L) 11/19/2014   HCT 36.4 11/19/2014   MCV 89.2 11/19/2014   PLT 289 11/19/2014    No results found for: TSH  Assessment:    Healthy female exam.   H/o HTN (controlled).  Obesity (Class II)   Plan:     Blood tests: TSH and Vitamin D levels ordered.  Breast self exam technique reviewed and patient encouraged to perform self-exam monthly. Discussed healthy lifestyle modifications. Pap smear up to date.   Contraception: Mirena IUD (placed 10/2014).  Screening colonoscopy up to date.  Mammogram up to date.  Desires flu vaccine. Administered today.  Follow up in 1 year for annual exam.    Hildred Laser, MD [facility]

## 2015-10-07 LAB — VITAMIN D 25 HYDROXY (VIT D DEFICIENCY, FRACTURES): Vit D, 25-Hydroxy: 33.4 ng/mL (ref 30.0–100.0)

## 2015-10-07 LAB — TSH: TSH: 1.53 u[IU]/mL (ref 0.450–4.500)

## 2015-11-11 ENCOUNTER — Encounter: Payer: Managed Care, Other (non HMO) | Admitting: Obstetrics and Gynecology

## 2015-12-13 ENCOUNTER — Telehealth: Payer: Self-pay | Admitting: Family Medicine

## 2015-12-13 NOTE — Telephone Encounter (Signed)
Patient is requesting refill on the following medications:  Tramadol 50mg  Alprazolam 0.25 mg  Patient is also requesting to have an order to have both knees x-rayed. Patient states that she has this done once every two years.

## 2015-12-15 NOTE — Telephone Encounter (Signed)
Called and left voicemail asking patient to please call office and schedule a medication refill appointment for these medications

## 2015-12-22 ENCOUNTER — Ambulatory Visit
Admission: RE | Admit: 2015-12-22 | Discharge: 2015-12-22 | Disposition: A | Discharge status: Home / Self Care | Payer: Managed Care, Other (non HMO) | Source: Ambulatory Visit | Attending: Family Medicine | Admitting: Family Medicine

## 2015-12-22 ENCOUNTER — Ambulatory Visit (INDEPENDENT_AMBULATORY_CARE_PROVIDER_SITE_OTHER): Payer: Managed Care, Other (non HMO) | Admitting: Family Medicine

## 2015-12-22 ENCOUNTER — Encounter: Payer: Self-pay | Admitting: Family Medicine

## 2015-12-22 VITALS — BP 120/67 | HR 84 | Temp 98.2°F | Resp 16 | Ht 61.0 in | Wt 202.2 lb

## 2015-12-22 DIAGNOSIS — M170 Bilateral primary osteoarthritis of knee: Secondary | ICD-10-CM

## 2015-12-22 DIAGNOSIS — F411 Generalized anxiety disorder: Secondary | ICD-10-CM | POA: Diagnosis not present

## 2015-12-22 DIAGNOSIS — I10 Essential (primary) hypertension: Secondary | ICD-10-CM | POA: Diagnosis not present

## 2015-12-22 DIAGNOSIS — R739 Hyperglycemia, unspecified: Secondary | ICD-10-CM

## 2015-12-22 LAB — POCT GLYCOSYLATED HEMOGLOBIN (HGB A1C): Hemoglobin A1C: 5.5

## 2015-12-22 MED ORDER — ALPRAZOLAM 0.25 MG PO TABS: 0.2500 mg | ORAL_TABLET | Freq: Two times a day (BID) | ORAL | 2 refills | Status: DC | PRN

## 2015-12-22 MED ORDER — TRAMADOL HCL 50 MG PO TABS: 50.0000 mg | ORAL_TABLET | Freq: Every day | ORAL | 2 refills | Status: DC | PRN

## 2015-12-22 NOTE — Progress Notes (Signed)
Name: Taylor Hurst   MRN: [id number]    DOB: 08/12/1965   Date:12/22/2015       Progress Note  Subjective  Chief Complaint  Chief Complaint  Patient presents with  . Medication Refill    xanax / tramadol    Hypertension  This is a chronic problem. The problem is controlled. Associated symptoms include anxiety. Pertinent negatives include no chest pain, headaches, palpitations or shortness of breath. Past treatments include angiotensin blockers, diuretics and calcium channel blockers.  Anxiety  Presents for follow-up visit. The problem has been unchanged. Symptoms include excessive worry and nervous/anxious behavior. Patient reports no chest pain, palpitations or shortness of breath.   Past treatments include benzodiazephines. The treatment provided significant relief. Compliance with prior treatments has been good.  Arthritis  Presents for follow-up visit. The disease course has been stable. Taylor Hurst complains of pain. Affected locations include the left knee and right knee. Past treatments include NSAIDs. The treatment provided significant relief. Compliance with prior treatments has been good.     Past Medical History:  Diagnosis Date  . Achilles tendinitis 09/23/2014   Right leg   . Arthritis   . Chronic cholecystitis 05/30/2011  . Gastric ulcer   . GERD (gastroesophageal reflux disease)    hx of pt currently has no issues since gallbladder surgery   . Heart murmur   . Hepatitis    2013 chronic active hepatitis during gallbladder issues  . History of blood transfusion    19983  . Hypertension   . Nausea   . Osteomyelitis (HCC)    1983 right leg;1988 left leg  . Torn rotator cuff 09/23/2014   Right side     Past Surgical History:  Procedure Laterality Date  . CHOLECYSTECTOMY  07/26/11  . INTRAOPERATIVE CHOLANGIOGRAM  07/26/2011   Procedure: INTRAOPERATIVE CHOLANGIOGRAM;  Surgeon: Ardeth Sportsman, MD;  Location: [facility];  Service: General;  Laterality: N/A;  . LIVER BIOPSY   07/26/2011   Procedure: LIVER BIOPSY;  Surgeon: Ardeth Sportsman, MD;  Location: [facility];  Service: General;  Laterality: N/A;  core liver biopsy   . osteomilitis     right and left leg/surgery; right 1983; left 1988  . osteomylitis  [id number]   right leg, left leg  . SHOULDER OPEN ROTATOR CUFF REPAIR Right 11/18/2014   Procedure: RIGHT SHOULDER MINI OPEN ROTATOR CUFF REPAIR SUBACROMIAL DECOMPRESSION  ;  Surgeon: Jene Every, MD;  Location: [facility];  Service: Orthopedics;  Laterality: Right;    Family History  Problem Relation Age of Onset  . COPD Mother   . Hypertension Mother   . Hyperlipidemia Mother   . Alcohol abuse Father   . Cancer Maternal Aunt     breast    Social History   Social History  . Marital status: Married    Spouse name: N/A  . Number of children: N/A  . Years of education: N/A   Occupational History  . Not on file.   Social History Main Topics  . Smoking status: Never Smoker  . Smokeless tobacco: Never Used  . Alcohol use 0.0 oz/week     Comment: occas  . Drug use: No  . Sexual activity: Yes    Birth control/ protection: IUD     Comment: Mirena   Other Topics Concern  . Not on file   Social History Narrative  . No narrative on file     Current Outpatient Prescriptions:  .  ALPRAZolam (XANAX) 0.25 MG tablet, Take  1 tablet (0.25 mg total) by mouth 2 (two) times daily as needed for anxiety., Disp: 60 tablet, Rfl: 0 .  amLODipine (NORVASC) 10 MG tablet, Take 1 tablet (10 mg total) by mouth daily., Disp: 90 tablet, Rfl: 1 .  b complex vitamins tablet, Take 1 tablet by mouth daily., Disp: , Rfl:  .  BEPREVE 1.5 % SOLN, INSTILL 1 DROP INTO BOTH EYES BID, Disp: , Rfl: 3 .  BIOTIN PO, Take 10,000 mg by mouth daily., Disp: , Rfl:  .  Black Cohosh 540 MG CAPS, Take 540 mg by mouth daily. , Disp: , Rfl:  .  FIBER PO, Take 5 tablets by mouth 2 (two) times daily., Disp: , Rfl:  .  levonorgestrel (MIRENA) 20 MCG/24HR IUD, 1 each by Intrauterine route once.  , Disp: , Rfl:  .  Melatonin 5 MG CAPS, Take 10 mg by mouth at bedtime. , Disp: , Rfl:  .  Multiple Vitamin (MULTI-VITAMIN DAILY PO), Take 1 tablet by mouth daily with breakfast. Women's, Disp: , Rfl:  .  Omega-3 Fatty Acids (FISH OIL) 1000 MG CAPS, Take 1,000 mg by mouth 2 (two) times daily., Disp: , Rfl:  .  omeprazole (PRILOSEC) 40 MG capsule, , Disp: , Rfl:  .  traMADol (ULTRAM) 50 MG tablet, Take 1 tablet (50 mg total) by mouth every 8 (eight) hours as needed., Disp: 90 tablet, Rfl: 0 .  valsartan-hydrochlorothiazide (DIOVAN-HCT) 320-25 MG tablet, Take 1 tablet by mouth daily., Disp: 90 tablet, Rfl: 1 .  Red Yeast Rice Extract 600 MG CAPS, Take 600 mg by mouth 2 (two) times daily. , Disp: , Rfl:   Allergies  Allergen Reactions  . Percocet [Oxycodone-Acetaminophen] Anaphylaxis    bronchospasms  . Adhesive [Tape] Itching  . Allergy Relief  [Chlorpheniramine Maleate]     Heart flutters  . Betadine  [Povidone Iodine]     blisters blisters  . Other     Betadine=blisters skin  ,Cashew Nuts,sunflower seeds-swelling and itching  . Penicillins Hives    Has patient had a PCN reaction causing immediate rash, facial/tongue/throat swelling, SOB or lightheadedness with hypotension: no Has patient had a PCN reaction causing severe rash involving mucus membranes or skin necrosis: no Has patient had a PCN reaction that required hospitalization happened in the hospital Has patient had a PCN reaction occurring within the last 10 years: no If all of the above answers are "NO", then may proceed with Cephalosporin use.      Review of Systems  Respiratory: Negative for shortness of breath.   Cardiovascular: Negative for chest pain and palpitations.  Musculoskeletal: Positive for arthritis.  Neurological: Negative for headaches.  Psychiatric/Behavioral: The patient is nervous/anxious.      Objective  Vitals:   12/22/15 0837  BP: 120/67  Pulse: 84  Resp: 16  Temp: 98.2 F (36.8 C)   TempSrc: Oral  SpO2: 95%  Weight: 202 lb 3.2 oz (91.7 kg)  Height: 5\' 1"  (1.549 m)    Physical Exam  Constitutional: Taylor Hurst is oriented to person, place, and time and well-developed, well-nourished, and in no distress.  HENT:  Head: Normocephalic and atraumatic.  Cardiovascular: Normal rate and regular rhythm.   Pulmonary/Chest: Effort normal and breath sounds normal.  Abdominal: Soft. Bowel sounds are normal.  Musculoskeletal:       Right knee: Taylor Hurst exhibits no swelling. No tenderness found.       Left knee: Taylor Hurst exhibits no swelling. No tenderness found.  Crepitus in both knees on gentle  ROM   Neurological: Taylor Hurst is alert and oriented to person, place, and time.  Nursing note and vitals reviewed.    Assessment & Plan  1. Generalized anxiety disorder Stable and responsive to alprazolam, refills provided - ALPRAZolam (XANAX) 0.25 MG tablet; Take 1 tablet (0.25 mg total) by mouth 2 (two) times daily as needed for anxiety.  Dispense: 60 tablet; Refill: 2  2. Primary osteoarthritis of both knees Obtain updated x-rays of the knee, continue on tramadol for pain relief - traMADol (ULTRAM) 50 MG tablet; Take 1 tablet (50 mg total) by mouth daily as needed.  Dispense: 30 tablet; Refill: 2 - DG Knee Complete 4 Views Right; Future - DG Knee Complete 4 Views Left; Future  3. Essential hypertension BP stable and controlled on present antihypertensive therapy  4. Hyperglycemia A1c is 5.5%, considered normal - POCT HgB A1C   Warnie Belair Asad A. Faylene KurtzShah [facility] 12/22/2015 8:41 AM

## 2016-01-18 IMAGING — MR MR ANKLE*R* W/O CM
4 of 6 series · 15 of 40 positions shown · non-contrast
Comparison: Plain films of the right foot 04/29/2014.

CLINICAL DATA: Posterior right ankle pain with swelling since May 2014. Question Achilles tendon tear. Initial encounter.

EXAM:
MRI OF THE RIGHT ANKLE WITHOUT CONTRAST
TECHNIQUE: Multiplanar, multisequence MR imaging of the ankle was performed. No
intravenous contrast was administered.

[Series 4: PD fat-sat · axial · 3.5mm · 0.25mm/px · z∈[-108,-12]mm · 6 of 30 slices shown]
[im 1/30]
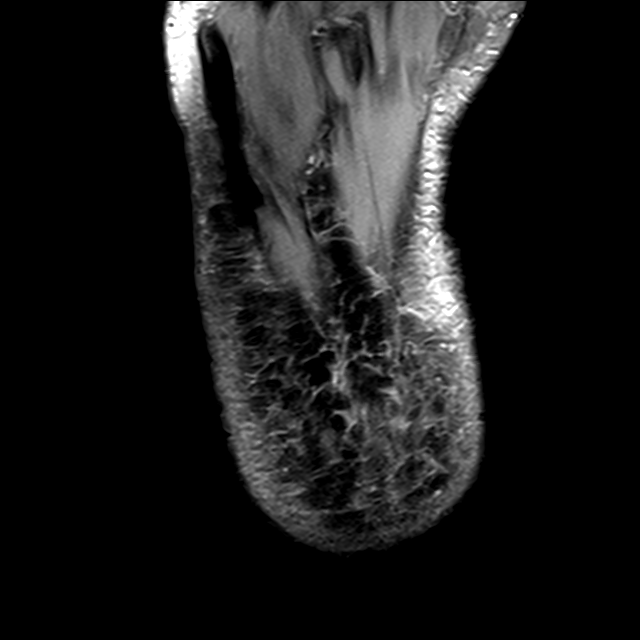
[im 5/30]
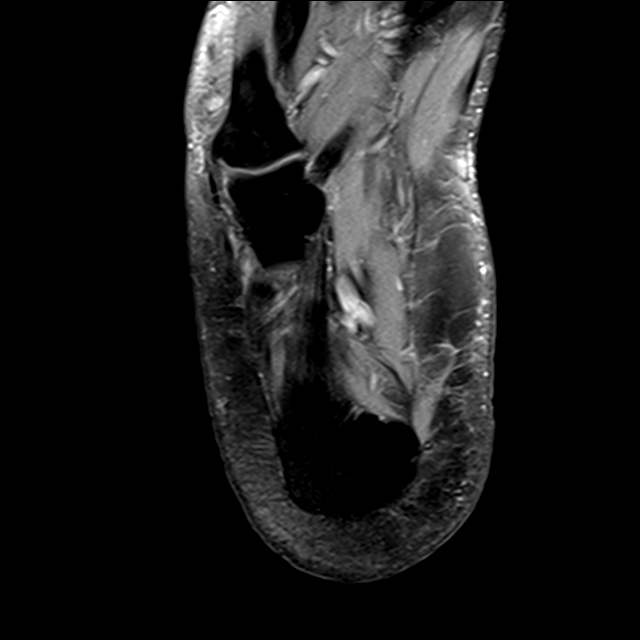
[im 10/30]
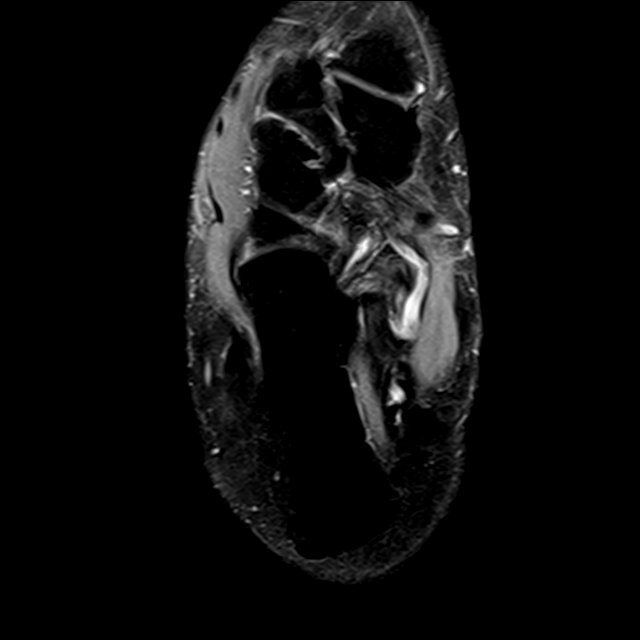
[im 15/30]
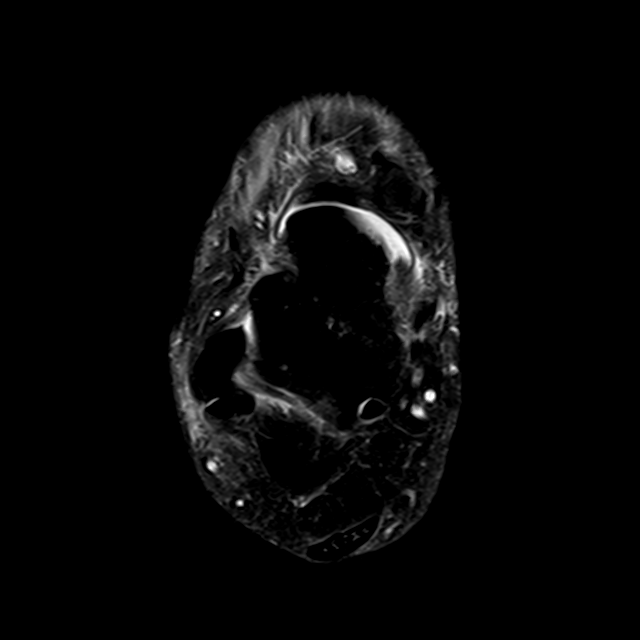
[im 20/30]
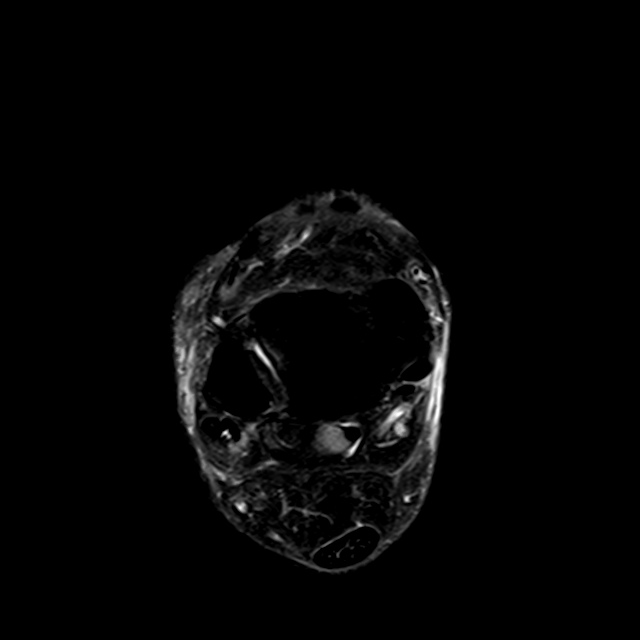
[im 25/30]
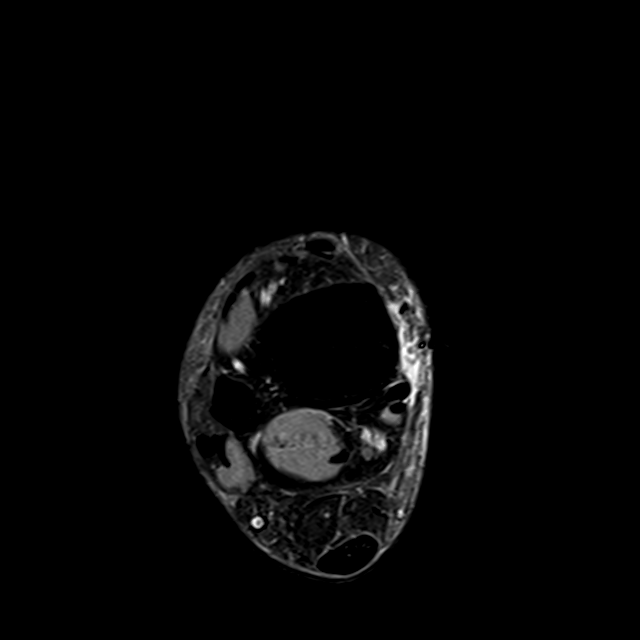

[Series 5: T2 fat-sat · axial · 3.5mm · 0.28mm/px · z∈[-94,-14]mm · 3 of 30 slices shown (1 of 3)]
[im 5/30]
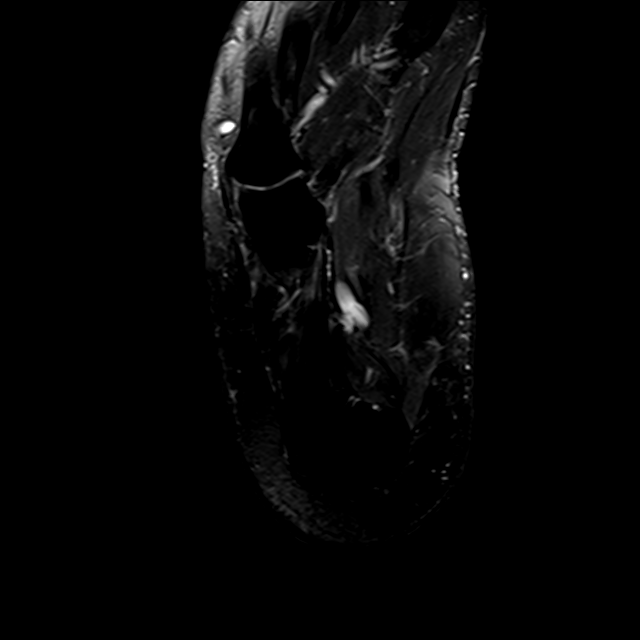
[im 17/30]
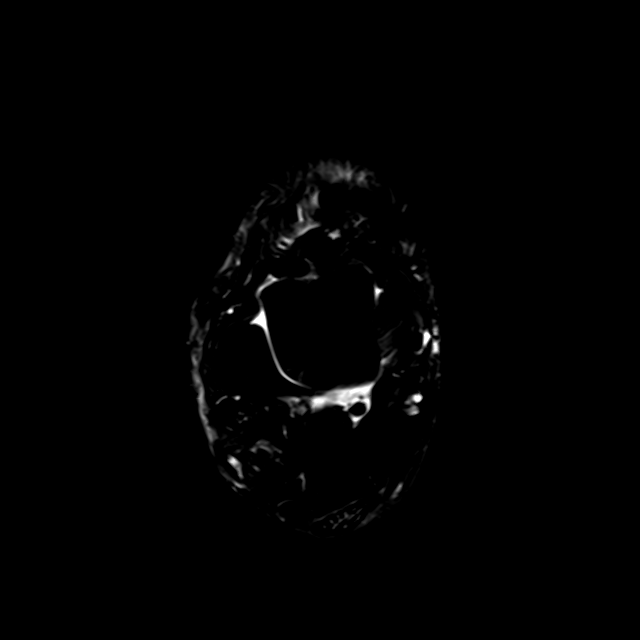
[im 25/30]
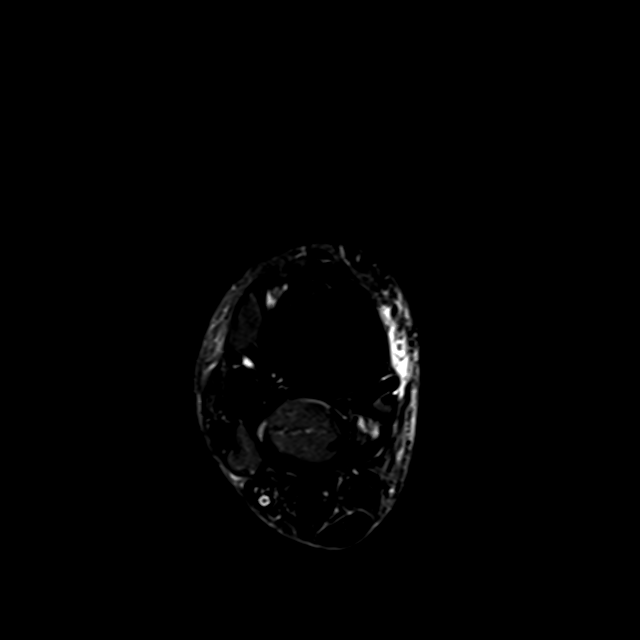

[Series 7: T2 fat-sat · coronal · 3.5mm · 0.35mm/px · 3 of 30 slices shown (2 of 3)]
[im 5/30]
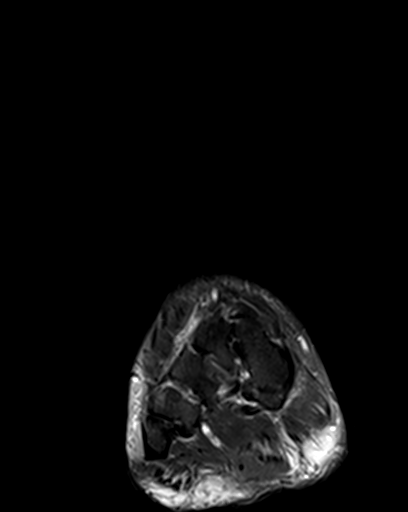
[im 17/30]
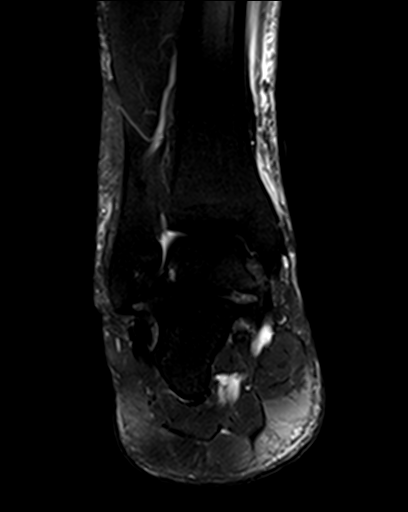
[im 25/30]
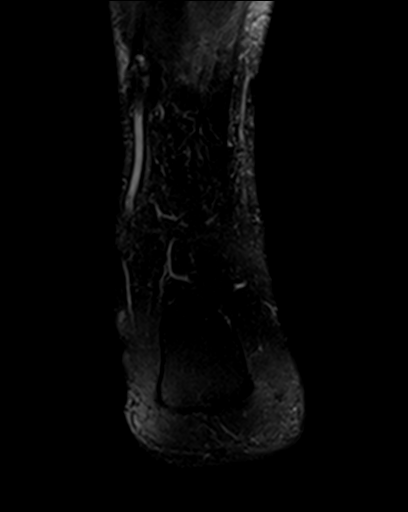

[Series 8: T2 fat-sat · sagittal · 3.0mm · 0.33mm/px · 3 of 20 slices shown (3 of 3)]
[im 1/20]
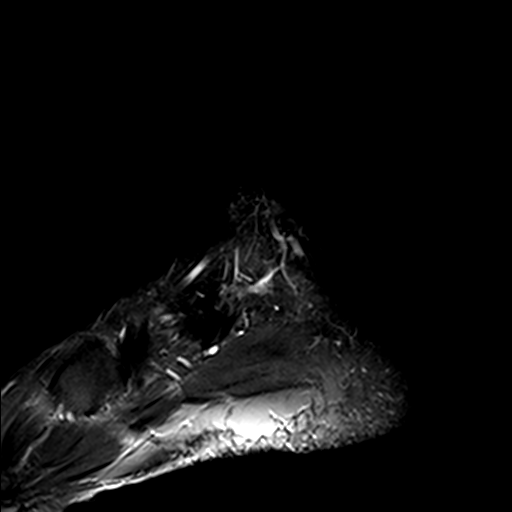
[im 10/20]
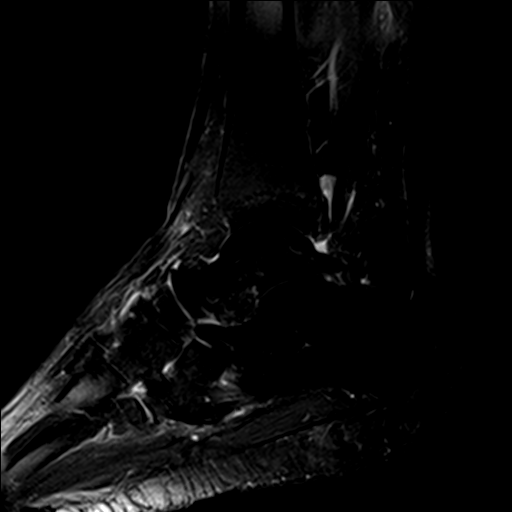
[im 20/20]
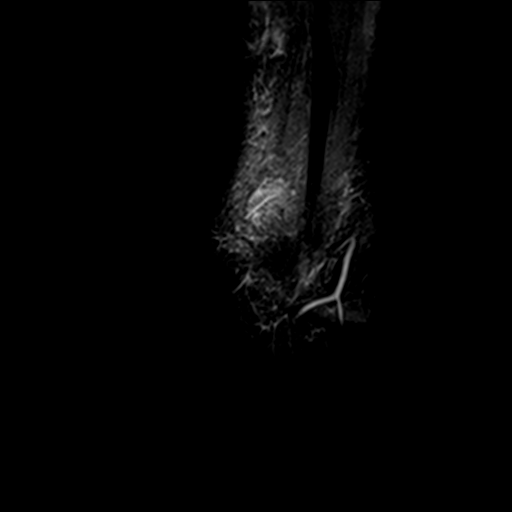

[15 of 40 positions shown; findings below may reference images not displayed]

FINDINGS: TENDONS

Peroneal: Intact

Posteromedial: Intact.

Anterior: Intact.

Achilles: The tendon is mildly thickened with some intrasubstance
increased T2 signal but is intact. There is no edema within Kager's
fat or fluid in the retrocalcaneal bursa.

Plantar Fascia: Unremarkable.

LIGAMENTS

Lateral: Intact.

Medial: Intact.

CARTILAGE

Ankle Joint: Unremarkable.

Subtalar Joints/Sinus Tarsi: Unremarkable.

Bones: Marrow signal is normal throughout.
IMPRESSION: Mild appearing Achilles tendinosis without tear. The study is
otherwise negative.

## 2016-03-22 ENCOUNTER — Ambulatory Visit (INDEPENDENT_AMBULATORY_CARE_PROVIDER_SITE_OTHER): Payer: Managed Care, Other (non HMO) | Admitting: Family Medicine

## 2016-03-22 ENCOUNTER — Encounter: Payer: Self-pay | Admitting: Family Medicine

## 2016-03-22 DIAGNOSIS — F411 Generalized anxiety disorder: Secondary | ICD-10-CM

## 2016-03-22 DIAGNOSIS — M170 Bilateral primary osteoarthritis of knee: Secondary | ICD-10-CM

## 2016-03-22 DIAGNOSIS — I10 Essential (primary) hypertension: Secondary | ICD-10-CM

## 2016-03-22 MED ORDER — TRAMADOL HCL 50 MG PO TABS: 50.0000 mg | ORAL_TABLET | Freq: Every day | ORAL | 2 refills | Status: DC | PRN

## 2016-03-22 MED ORDER — AMLODIPINE BESYLATE 10 MG PO TABS: 10.0000 mg | ORAL_TABLET | Freq: Every day | ORAL | 1 refills | Status: DC

## 2016-03-22 MED ORDER — ALPRAZOLAM 0.25 MG PO TABS: 0.2500 mg | ORAL_TABLET | Freq: Two times a day (BID) | ORAL | 2 refills | Status: DC | PRN

## 2016-03-22 MED ORDER — VALSARTAN-HYDROCHLOROTHIAZIDE 320-25 MG PO TABS: 1.0000 | ORAL_TABLET | Freq: Every day | ORAL | 1 refills | Status: DC

## 2016-03-22 NOTE — Progress Notes (Signed)
Name: Taylor GibsonJustina F Hurst   MRN: [id number]    DOB: 04/21/1965   Date:03/22/2016       Progress Note  Subjective  Chief Complaint  Chief Complaint  Patient presents with  . Follow-up    6 mo  . Medication Refill    Hypertension  This is a chronic problem. The problem is controlled. Associated symptoms include anxiety. Pertinent negatives include no blurred vision, chest pain, headaches, palpitations or shortness of breath. Past treatments include angiotensin blockers, diuretics and calcium channel blockers. There is no history of kidney disease, CAD/MI or CVA.  Anxiety  Presents for follow-up visit. The problem has been unchanged. Symptoms include excessive worry and nervous/anxious behavior. Patient reports no chest pain, depressed mood, insomnia, palpitations or shortness of breath. The severity of symptoms is moderate.   Past treatments include benzodiazephines. The treatment provided significant relief. Compliance with prior treatments has been good.  Knee Pain   The pain is present in the right knee and left knee. The pain is moderate. Pertinent negatives include no loss of sensation or muscle weakness. The symptoms are aggravated by weight bearing (worse with being on her feet all day). She has tried acetaminophen for the symptoms.     Past Medical History:  Diagnosis Date  . Achilles tendinitis 09/23/2014   Right leg   . Arthritis   . Chronic cholecystitis 05/30/2011  . Gastric ulcer   . GERD (gastroesophageal reflux disease)    hx of pt currently has no issues since gallbladder surgery   . Heart murmur   . Hepatitis    2013 chronic active hepatitis during gallbladder issues  . History of blood transfusion    19983  . Hypertension   . Nausea   . Osteomyelitis (HCC)    1983 right leg;1988 left leg  . Torn rotator cuff 09/23/2014   Right side     Past Surgical History:  Procedure Laterality Date  . CHOLECYSTECTOMY  07/26/11  . INTRAOPERATIVE CHOLANGIOGRAM  07/26/2011   Procedure: INTRAOPERATIVE CHOLANGIOGRAM;  Surgeon: Ardeth SportsmanSteven C. Gross, MD;  Location: [facility];  Service: General;  Laterality: N/A;  . LIVER BIOPSY  07/26/2011   Procedure: LIVER BIOPSY;  Surgeon: Ardeth SportsmanSteven C. Gross, MD;  Location: [facility];  Service: General;  Laterality: N/A;  core liver biopsy   . osteomilitis     right and left leg/surgery; right 1983; left 1988  . osteomylitis  [id number]   right leg, left leg  . SHOULDER OPEN ROTATOR CUFF REPAIR Right 11/18/2014   Procedure: RIGHT SHOULDER MINI OPEN ROTATOR CUFF REPAIR SUBACROMIAL DECOMPRESSION  ;  Surgeon: Jene EveryJeffrey Beane, MD;  Location: [facility];  Service: Orthopedics;  Laterality: Right;    Family History  Problem Relation Age of Onset  . COPD Mother   . Hypertension Mother   . Hyperlipidemia Mother   . Alcohol abuse Father   . Cancer Maternal Aunt     breast    Social History   Social History  . Marital status: Married    Spouse name: N/A  . Number of children: N/A  . Years of education: N/A   Occupational History  . Not on file.   Social History Main Topics  . Smoking status: Never Smoker  . Smokeless tobacco: Never Used  . Alcohol use 0.0 oz/week     Comment: occas  . Drug use: No  . Sexual activity: Yes    Birth control/ protection: IUD     Comment: Mirena   Other Topics Concern  .  Not on file   Social History Narrative  . No narrative on file     Current Outpatient Prescriptions:  .  ALPRAZolam (XANAX) 0.25 MG tablet, Take 1 tablet (0.25 mg total) by mouth 2 (two) times daily as needed for anxiety., Disp: 60 tablet, Rfl: 2 .  amLODipine (NORVASC) 10 MG tablet, Take 1 tablet (10 mg total) by mouth daily., Disp: 90 tablet, Rfl: 1 .  b complex vitamins tablet, Take 1 tablet by mouth daily., Disp: , Rfl:  .  BEPREVE 1.5 % SOLN, INSTILL 1 DROP INTO BOTH EYES BID, Disp: , Rfl: 3 .  BIOTIN PO, Take 10,000 mg by mouth daily., Disp: , Rfl:  .  Black Cohosh 540 MG CAPS, Take 540 mg by mouth daily. , Disp: , Rfl:  .   FIBER PO, Take 5 tablets by mouth 2 (two) times daily., Disp: , Rfl:  .  levonorgestrel (MIRENA) 20 MCG/24HR IUD, 1 each by Intrauterine route once. , Disp: , Rfl:  .  Melatonin 5 MG CAPS, Take 10 mg by mouth at bedtime. , Disp: , Rfl:  .  Multiple Vitamin (MULTI-VITAMIN DAILY PO), Take 1 tablet by mouth daily with breakfast. Women's, Disp: , Rfl:  .  Omega-3 Fatty Acids (FISH OIL) 1000 MG CAPS, Take 1,000 mg by mouth 2 (two) times daily., Disp: , Rfl:  .  omeprazole (PRILOSEC) 40 MG capsule, , Disp: , Rfl:  .  traMADol (ULTRAM) 50 MG tablet, Take 1 tablet (50 mg total) by mouth daily as needed., Disp: 30 tablet, Rfl: 2 .  valsartan-hydrochlorothiazide (DIOVAN-HCT) 320-25 MG tablet, Take 1 tablet by mouth daily., Disp: 90 tablet, Rfl: 1 .  Red Yeast Rice Extract 600 MG CAPS, Take 600 mg by mouth 2 (two) times daily. , Disp: , Rfl:   Allergies  Allergen Reactions  . Percocet [Oxycodone-Acetaminophen] Anaphylaxis    bronchospasms  . Adhesive [Tape] Itching  . Allergy Relief  [Chlorpheniramine Maleate]     Heart flutters  . Betadine  [Povidone Iodine]     blisters blisters  . Other     Betadine=blisters skin  ,Cashew Nuts,sunflower seeds-swelling and itching  . Penicillins Hives    Has patient had a PCN reaction causing immediate rash, facial/tongue/throat swelling, SOB or lightheadedness with hypotension: no Has patient had a PCN reaction causing severe rash involving mucus membranes or skin necrosis: no Has patient had a PCN reaction that required hospitalization happened in the hospital Has patient had a PCN reaction occurring within the last 10 years: no If all of the above answers are "NO", then may proceed with Cephalosporin use.      Review of Systems  Eyes: Negative for blurred vision.  Respiratory: Negative for shortness of breath.   Cardiovascular: Negative for chest pain and palpitations.  Neurological: Negative for headaches.  Psychiatric/Behavioral: The patient is  nervous/anxious. The patient does not have insomnia.     Objective  Vitals:   03/22/16 1049  BP: 120/80  Pulse: 90  Resp: 17  Temp: 98.2 F (36.8 C)  TempSrc: Oral  SpO2: 97%  Weight: 208 lb (94.3 kg)  Height: 5\' 1"  (1.549 m)    Physical Exam  Constitutional: She is oriented to person, place, and time and well-developed, well-nourished, and in no distress.  HENT:  Head: Normocephalic and atraumatic.  Cardiovascular: Normal rate and regular rhythm.   Pulmonary/Chest: Effort normal and breath sounds normal.  Abdominal: Soft. Bowel sounds are normal.  Musculoskeletal:  Right knee: She exhibits no swelling. No tenderness found.       Left knee: She exhibits no swelling. No tenderness found.  No crepitus in both knees on gentle ROM   Neurological: She is alert and oriented to person, place, and time.  Nursing note and vitals reviewed.       Assessment & Plan  1. Essential hypertension BP stable on present antihypertensive therapy - amLODipine (NORVASC) 10 MG tablet; Take 1 tablet (10 mg total) by mouth daily.  Dispense: 90 tablet; Refill: 1 - valsartan-hydrochlorothiazide (DIOVAN-HCT) 320-25 MG tablet; Take 1 tablet by mouth daily.  Dispense: 90 tablet; Refill: 1  2. Generalized anxiety disorder  - ALPRAZolam (XANAX) 0.25 MG tablet; Take 1 tablet (0.25 mg total) by mouth 2 (two) times daily as needed for anxiety.  Dispense: 60 tablet; Refill: 2  3. Primary osteoarthritis of both knees  - traMADol (ULTRAM) 50 MG tablet; Take 1 tablet (50 mg total) by mouth daily as needed.  Dispense: 30 tablet; Refill: 2   Garlene Apperson As[facility] 03/22/2016 10:53 AM

## 2016-06-21 ENCOUNTER — Ambulatory Visit (INDEPENDENT_AMBULATORY_CARE_PROVIDER_SITE_OTHER): Payer: 59 | Admitting: Family Medicine

## 2016-06-21 ENCOUNTER — Encounter: Payer: Self-pay | Admitting: Family Medicine

## 2016-06-21 VITALS — BP 120/73 | HR 83 | Temp 98.3°F | Resp 16 | Ht 61.0 in | Wt 217.4 lb

## 2016-06-21 DIAGNOSIS — E7800 Pure hypercholesterolemia, unspecified: Secondary | ICD-10-CM | POA: Diagnosis not present

## 2016-06-21 DIAGNOSIS — F411 Generalized anxiety disorder: Secondary | ICD-10-CM

## 2016-06-21 DIAGNOSIS — I10 Essential (primary) hypertension: Secondary | ICD-10-CM

## 2016-06-21 DIAGNOSIS — M170 Bilateral primary osteoarthritis of knee: Secondary | ICD-10-CM

## 2016-06-21 LAB — COMPLETE METABOLIC PANEL WITH GFR
ALT: 20 U/L (ref 6–29)
AST: 18 U/L (ref 10–35)
Albumin: 4.1 g/dL (ref 3.6–5.1)
Alkaline Phosphatase: 53 U/L (ref 33–130)
BUN: 13 mg/dL (ref 7–25)
CHLORIDE: 101 mmol/L (ref 98–110)
CO2: 32 mmol/L — AB (ref 20–31)
CREATININE: 0.74 mg/dL (ref 0.50–1.05)
Calcium: 9.4 mg/dL (ref 8.6–10.4)
GFR, Est African American: >89 mL/min (ref >=60)
GFR, Est Non African American: >89 mL/min (ref >=60)
Glucose, Bld: 97 mg/dL (ref 65–99)
Potassium: 3.4 mmol/L — ABNORMAL LOW (ref 3.5–5.3)
Sodium: 142 mmol/L (ref 135–146)
Total Bilirubin: 0.8 mg/dL (ref 0.2–1.2)
Total Protein: 6.8 g/dL (ref 6.1–8.1)

## 2016-06-21 LAB — LIPID PANEL
CHOLESTEROL: 214 mg/dL — AB (ref <200)
HDL: 64 mg/dL (ref >50)
LDL Cholesterol: 127 mg/dL — ABNORMAL HIGH (ref <100)
Total CHOL/HDL Ratio: 3.3 Ratio (ref <5.0)
Triglycerides: 114 mg/dL (ref <150)
VLDL: 23 mg/dL (ref <30)

## 2016-06-21 MED ORDER — AMLODIPINE BESYLATE 10 MG PO TABS: 10.0000 mg | ORAL_TABLET | Freq: Every day | ORAL | 1 refills | Status: DC

## 2016-06-21 MED ORDER — VALSARTAN-HYDROCHLOROTHIAZIDE 320-25 MG PO TABS: 1.0000 | ORAL_TABLET | Freq: Every day | ORAL | 1 refills | Status: DC

## 2016-06-21 MED ORDER — ALPRAZOLAM 0.25 MG PO TABS: 0.2500 mg | ORAL_TABLET | Freq: Two times a day (BID) | ORAL | 2 refills | Status: DC | PRN

## 2016-06-21 MED ORDER — TRAMADOL HCL 50 MG PO TABS
50.0000 mg | ORAL_TABLET | Freq: Every day | ORAL | 2 refills | Status: DC | PRN
Start: 2016-06-21 — End: 2016-11-09

## 2016-06-21 NOTE — Progress Notes (Signed)
Name: Taylor Hurst   MRN: [id number]    DOB: 1965-08-12   Date:06/21/2016       Progress Note  Subjective  Chief Complaint  Chief Complaint  Patient presents with  . Follow-up    3 mo  . Medication Refill    Hypertension  This is a chronic problem. The problem is unchanged. The problem is controlled. Associated symptoms include anxiety. Pertinent negatives include no blurred vision, chest pain, headaches, palpitations or shortness of breath. Past treatments include angiotensin blockers, diuretics and calcium channel blockers. There is no history of kidney disease, CAD/MI or CVA.  Anxiety  Presents for follow-up visit. The problem has been unchanged. Symptoms include excessive worry and nervous/anxious behavior. Patient reports no chest pain, depressed mood, insomnia, irritability, palpitations or shortness of breath. The severity of symptoms is moderate.   Past treatments include benzodiazephines. The treatment provided significant relief. Compliance with prior treatments has been good.  Knee Pain   The pain is present in the right knee and left knee. The quality of the pain is described as aching. The pain is at a severity of 4/10 (recently worse especially at night). The pain is moderate. The pain has been improving since onset. Pertinent negatives include no loss of sensation or muscle weakness. The symptoms are aggravated by weight bearing (worse with being on her feet all day). She has tried acetaminophen (takes Tramadol and Tylenol arthritis) for the symptoms.     Past Medical History:  Diagnosis Date  . Achilles tendinitis 09/23/2014   Right leg   . Arthritis   . Chronic cholecystitis 05/30/2011  . Gastric ulcer   . GERD (gastroesophageal reflux disease)    hx of pt currently has no issues since gallbladder surgery   . Heart murmur   . Hepatitis    2013 chronic active hepatitis during gallbladder issues  . History of blood transfusion    19983  . Hypertension   . Nausea    . Osteomyelitis (HCC)    1983 right leg;1988 left leg  . Torn rotator cuff 09/23/2014   Right side     Past Surgical History:  Procedure Laterality Date  . CHOLECYSTECTOMY  07/26/11  . INTRAOPERATIVE CHOLANGIOGRAM  07/26/2011   Procedure: INTRAOPERATIVE CHOLANGIOGRAM;  Surgeon: Ardeth Sportsman, MD;  Location: [facility];  Service: General;  Laterality: N/A;  . LIVER BIOPSY  07/26/2011   Procedure: LIVER BIOPSY;  Surgeon: Ardeth Sportsman, MD;  Location: [facility];  Service: General;  Laterality: N/A;  core liver biopsy   . osteomilitis     right and left leg/surgery; right 1983; left 1988  . osteomylitis  [id number]   right leg, left leg  . SHOULDER OPEN ROTATOR CUFF REPAIR Right 11/18/2014   Procedure: RIGHT SHOULDER MINI OPEN ROTATOR CUFF REPAIR SUBACROMIAL DECOMPRESSION  ;  Surgeon: Jene Every, MD;  Location: [facility];  Service: Orthopedics;  Laterality: Right;    Family History  Problem Relation Age of Onset  . COPD Mother   . Hypertension Mother   . Hyperlipidemia Mother   . Alcohol abuse Father   . Cancer Maternal Aunt        breast    Social History   Social History  . Marital status: Married    Spouse name: N/A  . Number of children: N/A  . Years of education: N/A   Occupational History  . Not on file.   Social History Main Topics  . Smoking status: Never Smoker  . Smokeless  tobacco: Never Used  . Alcohol use 0.0 oz/week     Comment: occas  . Drug use: No  . Sexual activity: Yes    Birth control/ protection: IUD     Comment: Mirena   Other Topics Concern  . Not on file   Social History Narrative  . No narrative on file     Current Outpatient Prescriptions:  .  ALPRAZolam (XANAX) 0.25 MG tablet, Take 1 tablet (0.25 mg total) by mouth 2 (two) times daily as needed for anxiety., Disp: 60 tablet, Rfl: 2 .  amLODipine (NORVASC) 10 MG tablet, Take 1 tablet (10 mg total) by mouth daily., Disp: 90 tablet, Rfl: 1 .  b complex vitamins tablet, Take 1 tablet by mouth  daily., Disp: , Rfl:  .  BEPREVE 1.5 % SOLN, INSTILL 1 DROP INTO BOTH EYES BID, Disp: , Rfl: 3 .  BIOTIN PO, Take 10,000 mg by mouth daily., Disp: , Rfl:  .  Black Cohosh 540 MG CAPS, Take 540 mg by mouth daily. , Disp: , Rfl:  .  FIBER PO, Take 5 tablets by mouth 2 (two) times daily., Disp: , Rfl:  .  levonorgestrel (MIRENA) 20 MCG/24HR IUD, 1 each by Intrauterine route once. , Disp: , Rfl:  .  Melatonin 5 MG CAPS, Take 10 mg by mouth at bedtime. , Disp: , Rfl:  .  Multiple Vitamin (MULTI-VITAMIN DAILY PO), Take 1 tablet by mouth daily with breakfast. Women's, Disp: , Rfl:  .  Omega-3 Fatty Acids (FISH OIL) 1000 MG CAPS, Take 1,000 mg by mouth 2 (two) times daily., Disp: , Rfl:  .  omeprazole (PRILOSEC) 40 MG capsule, , Disp: , Rfl:  .  traMADol (ULTRAM) 50 MG tablet, Take 1 tablet (50 mg total) by mouth daily as needed., Disp: 30 tablet, Rfl: 2 .  valsartan-hydrochlorothiazide (DIOVAN-HCT) 320-25 MG tablet, Take 1 tablet by mouth daily., Disp: 90 tablet, Rfl: 1  Allergies  Allergen Reactions  . Percocet [Oxycodone-Acetaminophen] Anaphylaxis    bronchospasms  . Adhesive [Tape] Itching  . Allergy Relief  [Chlorpheniramine Maleate]     Heart flutters  . Betadine  [Povidone Iodine]     blisters blisters  . Other     Betadine=blisters skin  ,Cashew Nuts,sunflower seeds-swelling and itching  . Penicillins Hives    Has patient had a PCN reaction causing immediate rash, facial/tongue/throat swelling, SOB or lightheadedness with hypotension: no Has patient had a PCN reaction causing severe rash involving mucus membranes or skin necrosis: no Has patient had a PCN reaction that required hospitalization happened in the hospital Has patient had a PCN reaction occurring within the last 10 years: no If all of the above answers are "NO", then may proceed with Cephalosporin use.      Review of Systems  Constitutional: Negative for irritability.  Eyes: Negative for blurred vision.  Respiratory:  Negative for shortness of breath.   Cardiovascular: Negative for chest pain and palpitations.  Neurological: Negative for headaches.  Psychiatric/Behavioral: The patient is nervous/anxious. The patient does not have insomnia.     Objective  Vitals:   06/21/16 1034  BP: 120/73  Pulse: 83  Resp: 16  Temp: 98.3 F (36.8 C)  TempSrc: Oral  SpO2: 96%  Weight: 217 lb 6.4 oz (98.6 kg)  Height: 5\' 1"  (1.549 m)    Physical Exam  Constitutional: She is oriented to person, place, and time and well-developed, well-nourished, and in no distress.  HENT:  Head: Normocephalic and atraumatic.  Cardiovascular:  Normal rate, regular rhythm and normal heart sounds.   No murmur heard. Pulmonary/Chest: Effort normal and breath sounds normal. She has no wheezes.  Abdominal: Soft. Bowel sounds are normal. There is no tenderness.  Musculoskeletal:       Right knee: She exhibits no swelling.       Left knee: She exhibits no swelling.       Right ankle: She exhibits swelling.       Left ankle: She exhibits swelling.  Neurological: She is alert and oriented to person, place, and time.  Psychiatric: Mood, memory, affect and judgment normal.  Nursing note and vitals reviewed.    Assessment & Plan  1. Essential hypertension BP stable on present antihypertensive therapy - valsartan-hydrochlorothiazide (DIOVAN-HCT) 320-25 MG tablet; Take 1 tablet by mouth daily.  Dispense: 90 tablet; Refill: 1 - amLODipine (NORVASC) 10 MG tablet; Take 1 tablet (10 mg total) by mouth daily.  Dispense: 90 tablet; Refill: 1  2. Generalized anxiety disorder Symptoms of anxiety are stable and controlled on alprazolam taken twice a day when necessary - ALPRAZolam (XANAX) 0.25 MG tablet; Take 1 tablet (0.25 mg total) by mouth 2 (two) times daily as needed for anxiety.  Dispense: 60 tablet; Refill: 2  3. Primary osteoarthritis of both knees Takes tramadol for pain along with Tylenol arthritis as needed - traMADol (ULTRAM)  50 MG tablet; Take 1 tablet (50 mg total) by mouth daily as needed.  Dispense: 30 tablet; Refill: 2  4. Hypercholesterolemia Obtain FLP and treat accordingly - Lipid panel - COMPLETE METABOLIC PANEL WITH GFR    Kashus Karlen [facility] 06/21/2016 10:41 AM

## 2016-06-28 ENCOUNTER — Other Ambulatory Visit: Payer: Self-pay

## 2016-06-28 DIAGNOSIS — E876 Hypokalemia: Secondary | ICD-10-CM

## 2016-06-29 LAB — COMPLETE METABOLIC PANEL WITH GFR
ALT: 20 U/L (ref 6–29)
AST: 18 U/L (ref 10–35)
Albumin: 4.2 g/dL (ref 3.6–5.1)
Alkaline Phosphatase: 59 U/L (ref 33–130)
BUN: 14 mg/dL (ref 7–25)
CALCIUM: 9.4 mg/dL (ref 8.6–10.4)
CHLORIDE: 98 mmol/L (ref 98–110)
CO2: 28 mmol/L (ref 20–31)
Creat: 0.78 mg/dL (ref 0.50–1.05)
GFR, Est Non African American: 88 mL/min (ref >=60)
Glucose, Bld: 95 mg/dL (ref 65–99)
POTASSIUM: 3.6 mmol/L (ref 3.5–5.3)
Sodium: 138 mmol/L (ref 135–146)
Total Bilirubin: 0.7 mg/dL (ref 0.2–1.2)
Total Protein: 6.7 g/dL (ref 6.1–8.1)

## 2016-07-25 ENCOUNTER — Other Ambulatory Visit: Payer: Self-pay | Admitting: Family Medicine

## 2016-07-25 DIAGNOSIS — M170 Bilateral primary osteoarthritis of knee: Secondary | ICD-10-CM

## 2016-07-26 ENCOUNTER — Telehealth: Payer: Self-pay | Admitting: Family Medicine

## 2016-07-26 NOTE — Telephone Encounter (Signed)
Requesting return call to discuss tramadol refill.

## 2016-07-27 NOTE — Telephone Encounter (Signed)
Dr. Sherryll BurgerShah please call patient for advice Requesting return call to discuss tramadol refill.

## 2016-07-27 NOTE — Telephone Encounter (Signed)
Contacted patient and she has been able to obtain the refill from the pharmacy as she had 2 refills remaining from her original prescription from June 2018.

## 2016-09-05 ENCOUNTER — Other Ambulatory Visit: Payer: Self-pay | Admitting: Family Medicine

## 2016-09-05 DIAGNOSIS — Z1231 Encounter for screening mammogram for malignant neoplasm of breast: Secondary | ICD-10-CM

## 2016-09-27 ENCOUNTER — Ambulatory Visit
Admission: RE | Admit: 2016-09-27 | Discharge: 2016-09-27 | Disposition: A | Discharge status: Home / Self Care | Payer: 59 | Source: Ambulatory Visit | Attending: Family Medicine | Admitting: Family Medicine

## 2016-09-27 DIAGNOSIS — Z1231 Encounter for screening mammogram for malignant neoplasm of breast: Secondary | ICD-10-CM

## 2016-10-01 ENCOUNTER — Other Ambulatory Visit: Payer: Self-pay | Admitting: Family Medicine

## 2016-10-01 DIAGNOSIS — R928 Other abnormal and inconclusive findings on diagnostic imaging of breast: Secondary | ICD-10-CM

## 2016-10-04 ENCOUNTER — Telehealth: Payer: Self-pay

## 2016-10-04 ENCOUNTER — Other Ambulatory Visit: Payer: Self-pay | Admitting: Family Medicine

## 2016-10-04 DIAGNOSIS — R928 Other abnormal and inconclusive findings on diagnostic imaging of breast: Secondary | ICD-10-CM

## 2016-10-04 NOTE — Telephone Encounter (Signed)
Erroneous entry

## 2016-10-05 ENCOUNTER — Ambulatory Visit
Admission: RE | Admit: 2016-10-05 | Discharge: 2016-10-05 | Disposition: A | Discharge status: Home / Self Care | Payer: 59 | Source: Ambulatory Visit | Attending: Family Medicine | Admitting: Family Medicine

## 2016-10-05 DIAGNOSIS — R928 Other abnormal and inconclusive findings on diagnostic imaging of breast: Secondary | ICD-10-CM

## 2016-10-11 ENCOUNTER — Encounter: Payer: Managed Care, Other (non HMO) | Admitting: Obstetrics and Gynecology

## 2016-10-12 ENCOUNTER — Ambulatory Visit (INDEPENDENT_AMBULATORY_CARE_PROVIDER_SITE_OTHER): Payer: 59 | Admitting: Obstetrics and Gynecology

## 2016-10-12 ENCOUNTER — Encounter: Payer: Self-pay | Admitting: Obstetrics and Gynecology

## 2016-10-12 VITALS — BP 129/82 | HR 82 | Ht 61.0 in | Wt 215.5 lb

## 2016-10-12 DIAGNOSIS — E669 Obesity, unspecified: Secondary | ICD-10-CM

## 2016-10-12 DIAGNOSIS — F4321 Adjustment disorder with depressed mood: Secondary | ICD-10-CM

## 2016-10-12 DIAGNOSIS — Z01419 Encounter for gynecological examination (general) (routine) without abnormal findings: Secondary | ICD-10-CM | POA: Diagnosis not present

## 2016-10-12 DIAGNOSIS — E7800 Pure hypercholesterolemia, unspecified: Secondary | ICD-10-CM

## 2016-10-12 DIAGNOSIS — I10 Essential (primary) hypertension: Secondary | ICD-10-CM | POA: Diagnosis not present

## 2016-10-12 DIAGNOSIS — Z124 Encounter for screening for malignant neoplasm of cervix: Secondary | ICD-10-CM | POA: Diagnosis not present

## 2016-10-12 DIAGNOSIS — Z23 Encounter for immunization: Secondary | ICD-10-CM

## 2016-10-12 DIAGNOSIS — F4320 Adjustment disorder, unspecified: Secondary | ICD-10-CM

## 2016-10-12 NOTE — Progress Notes (Signed)
GYNECOLOGY CLINIC PROGRESS NOTE  Subjective:    Taylor Hurst is a 51 y.o. married P73 female who presents for an annual exam. The patient is sexually active.  The patient wears seatbelts: yes. The patient participates in regular exercise: no. Has the patient ever been transfused or tattooed?: tattooed. The patient reports that there is not domestic violence in her life.    The patient has the following concerns today: 1. Concerned regarding her husband's ED.  Notes several factors including his health, stress at job.  2. Dealing with grief over death of her grandchild.  (Daughter delivered several months early, baby survived for 7 weeks, recently passed.  Had memorial earlier this month).    Menstrual History: Menarche age: 1 No LMP recorded. Patient is not currently having periods (Reason: IUD).  Contraception: IUD (placed 09/2014).   Denies h/o abnormal menses or STIs. Denies h/o abnormal pap smears.  Last pap: approximate date 08/2013 and was normal Last mammogram: approximate date 09/2016, abnormal (BIRADS 0, possible mass in the right breast). 3D mammogram and ultrasound noting: No evidence of malignancy. Benign dilated right retroareolar duct. Recommend repeat in 1 year.  Last Colonoscopy: 05/2015, normal. Also had endoscopy which noted small gastric ulcer.     Past Medical History:  Diagnosis Date  . Achilles tendinitis 09/23/2014   Right leg   . Arthritis   . Chronic cholecystitis 05/30/2011  . Gastric ulcer   . GERD (gastroesophageal reflux disease)    hx of pt currently has no issues since gallbladder surgery   . Heart murmur   . Hepatitis    2013 chronic active hepatitis during gallbladder issues  . History of blood transfusion    [id number]  . Hypertension   . Nausea   . Osteomyelitis (HCC)    1983 right leg;1988 left leg  . Torn rotator cuff 09/23/2014   Right side     Past Surgical History:  Procedure Laterality Date  . CHOLECYSTECTOMY  07/26/11  .  INTRAOPERATIVE CHOLANGIOGRAM  07/26/2011   Procedure: INTRAOPERATIVE CHOLANGIOGRAM;  Surgeon: Ardeth Sportsman, MD;  Location: [facility];  Service: General;  Laterality: N/A;  . LIVER BIOPSY  07/26/2011   Procedure: LIVER BIOPSY;  Surgeon: Ardeth Sportsman, MD;  Location: [facility];  Service: General;  Laterality: N/A;  core liver biopsy   . osteomilitis     right and left leg/surgery; right 1983; left 1988  . osteomylitis  [id number]   right leg, left leg  . SHOULDER OPEN ROTATOR CUFF REPAIR Right 11/18/2014   Procedure: RIGHT SHOULDER MINI OPEN ROTATOR CUFF REPAIR SUBACROMIAL DECOMPRESSION  ;  Surgeon: Jene Every, MD;  Location: [facility];  Service: Orthopedics;  Laterality: Right;    Family History  Problem Relation Age of Onset  . COPD Mother   . Hypertension Mother   . Hyperlipidemia Mother   . Alcohol abuse Father   . Cancer Maternal Aunt        breast  . Breast cancer Paternal Aunt     Social History   Social History  . Marital status: Married    Spouse name: N/A  . Number of children: N/A  . Years of education: N/A   Occupational History  . Not on file.   Social History Main Topics  . Smoking status: Never Smoker  . Smokeless tobacco: Never Used  . Alcohol use 0.0 oz/week     Comment: occas  . Drug use: No  . Sexual activity: Yes  Birth control/ protection: IUD     Comment: Mirena   Other Topics Concern  . Not on file   Social History Narrative  . No narrative on file    Current Outpatient Prescriptions on File Prior to Visit  Medication Sig Dispense Refill  . ALPRAZolam (XANAX) 0.25 MG tablet Take 1 tablet (0.25 mg total) by mouth 2 (two) times daily as needed for anxiety. 60 tablet 2  . amLODipine (NORVASC) 10 MG tablet Take 1 tablet (10 mg total) by mouth daily. 90 tablet 1  . b complex vitamins tablet Take 1 tablet by mouth daily.    Marland Kitchen BIOTIN PO Take 10,000 mg by mouth daily.    . Black Cohosh 540 MG CAPS Take 540 mg by mouth daily.     Marland Kitchen FIBER PO Take 5  tablets by mouth 2 (two) times daily.    Marland Kitchen levonorgestrel (MIRENA) 20 MCG/24HR IUD 1 each by Intrauterine route once.     . Melatonin 5 MG CAPS Take 10 mg by mouth at bedtime.     . Multiple Vitamin (MULTI-VITAMIN DAILY PO) Take 1 tablet by mouth daily with breakfast. Women's    . Omega-3 Fatty Acids (FISH OIL) 1000 MG CAPS Take 1,000 mg by mouth 2 (two) times daily.    Marland Kitchen omeprazole (PRILOSEC) 40 MG capsule     . traMADol (ULTRAM) 50 MG tablet Take 1 tablet (50 mg total) by mouth daily as needed. 30 tablet 2  . valsartan-hydrochlorothiazide (DIOVAN-HCT) 320-25 MG tablet Take 1 tablet by mouth daily. 90 tablet 1   No current facility-administered medications on file prior to visit.     Allergies  Allergen Reactions  . Percocet [Oxycodone-Acetaminophen] Anaphylaxis    bronchospasms  . Adhesive [Tape] Itching  . Allergy Relief  [Chlorpheniramine Maleate]     Heart flutters  . Betadine  [Povidone Iodine]     blisters blisters  . Other     Betadine=blisters skin  ,Cashew Nuts,sunflower seeds-swelling and itching  . Penicillins Hives    Has patient had a PCN reaction causing immediate rash, facial/tongue/throat swelling, SOB or lightheadedness with hypotension: no Has patient had a PCN reaction causing severe rash involving mucus membranes or skin necrosis: no Has patient had a PCN reaction that required hospitalization happened in the hospital Has patient had a PCN reaction occurring within the last 10 years: no If all of the above answers are "NO", then may proceed with Cephalosporin use.      Review of Systems Constitutional: negative for chills, fatigue, fevers and sweats Eyes: negative for irritation, redness and visual disturbance Ears, nose, mouth, throat, and face: negative for hearing loss, nasal congestion, snoring and tinnitus Respiratory: negative for asthma, cough, sputum Cardiovascular: negative for chest pain, dyspnea, exertional chest pressure/discomfort, irregular  heart beat, palpitations and syncope Gastrointestinal: negative for abdominal pain, change in bowel habits, nausea and vomiting Genitourinary: negative for abnormal menstrual periods, genital lesions, sexual problems and vaginal discharge, dysuria and urinary incontinence Integument/breast: negative for breast lump, breast tenderness and nipple discharge Hematologic/lymphatic: negative for bleeding and easy bruising Musculoskeletal:negative for back pain and muscle weakness Neurological: negative for dizziness, headaches, vertigo and weakness Endocrine: negative for diabetic symptoms including polydipsia, polyuria and skin dryness Allergic/Immunologic: negative for hay fever and urticaria    Objective:   Blood pressure 129/82, pulse 82, height  (1.549 m), weight 215 lb 8 oz (97.8 kg). Body mass index is 40.72 kg/m.   General Appearance:    Alert, cooperative, no  distress, appears stated age; morbidly obese  Head:    Normocephalic, without obvious abnormality, atraumatic  Eyes:    PERRL, conjunctiva/corneas clear, EOM's intact, both eyes  Ears:    Normal external ear canals, both ears  Nose:   Nares normal, septum midline, mucosa normal, no drainage or sinus tenderness  Throat:   Lips, mucosa, and tongue normal; teeth and gums normal  Neck:   Supple, symmetrical, trachea midline, no adenopathy; thyroid: no enlargement/tenderness/nodules; no carotid bruit or JVD  Back:     Symmetric, no curvature, ROM normal, no CVA tenderness  Lungs:     Clear to auscultation bilaterally, respirations unlabored  Chest Wall:    No tenderness or deformity   Heart:    Regular rate and rhythm, S1 and S2 normal, no murmur, rub or gallop  Breast Exam:    No tenderness, masses, or nipple abnormality  Abdomen:     Soft, non-tender, bowel sounds active all four quadrants, no masses, no organomegaly.   Genitalia:    Pelvic:external genitalia normal. Vagina without lesions, discharge, or tenderness;  rectovaginal septum  normal. Cervix normal in appearance, no cervical motion tenderness.  Uterus normal size and shape, mobile, nontender.  No adnexal masses or tenderness.   Rectal:    Normal external sphincter.  No hemorrhoids appreciated. Internal exam not done.   Extremities:   Extremities normal, atraumatic, no cyanosis or edema  Pulses:   2+ and symmetric all extremities  Skin:   Skin color, texture, turgor normal, no rashes or lesions  Lymph nodes:   Cervical, supraclavicular, and axillary nodes normal  Neurologic:   CNII-XII intact, normal strength, sensation and reflexes throughout     Labs:  Orders Only on 06/28/2016  Component Date Value Ref Range Status  . Sodium 06/28/2016 138  135 - 146 mmol/L Final  . Potassium 06/28/2016 3.6  3.5 - 5.3 mmol/L Final  . Chloride 06/28/2016 98  98 - 110 mmol/L Final  . CO2 06/28/2016 28  20 - 31 mmol/L Final  . Glucose, Bld 06/28/2016 95  65 - 99 mg/dL Final  . BUN 16/10/9602 14  7 - 25 mg/dL Final  . Creat 54/09/8117 0.78  0.50 - 1.05 mg/dL Final   Comment:   For patients > or = 51 years of age: The upper reference limit for Creatinine is approximately 13% higher for people identified as African-American.     . Total Bilirubin 06/28/2016 0.7  0.2 - 1.2 mg/dL Final  . Alkaline Phosphatase 06/28/2016 59  33 - 130 U/L Final  . AST 06/28/2016 18  10 - 35 U/L Final  . ALT 06/28/2016 20  6 - 29 U/L Final  . Total Protein 06/28/2016 6.7  6.1 - 8.1 g/dL Final  . Albumin 14/78/2956 4.2  3.6 - 5.1 g/dL Final  . Calcium 21/30/8657 9.4  8.6 - 10.4 mg/dL Final  . GFR, Est African American 06/28/2016 >89  >=60 mL/min Final  . GFR, Est Non African American 06/28/2016 88  >=60 mL/min Final    Lab Results  Component Value Date   WBC 12.2 (H) 11/19/2014   HGB 11.7 (L) 11/19/2014   HCT 36.4 11/19/2014   MCV 89.2 11/19/2014   PLT 289 11/19/2014    Lab Results  Component Value Date   TSH 1.530 10/06/2015    Lab Results  Component Value  Date   CHOL 214 (H) 06/21/2016   HDL 64 06/21/2016   LDLCALC 127 (H) 06/21/2016   TRIG 114 06/21/2016  CHOLHDL 3.3 06/21/2016    Assessment:   Routine gynecologic exam.   H/o HTN (controlled).  Obesity (Class III) Dyslipdemia Grief   Plan:    Blood tests: Labs reviewed.  Breast self exam technique reviewed and patient encouraged to perform self-exam monthly. Discussed healthy lifestyle modifications. Pap smear performed today.    Contraception: Mirena IUD (placed 10/2014).  Screening colonoscopy up to date.  Mammogram up to date.  Desires flu vaccine. Administered today.  Discussed that patient should have a discussion with her husband regarding his health, as well as other non-sexual methods of intimacy, and use of toys for sexual pleasure.  Grief reaction following loss of grandchild, given handout of grief support groups, also can share with her daughter.  Follow up in 1 year for annual exam.    Hildred Laser, MD [facility]

## 2016-10-14 NOTE — Patient Instructions (Addendum)
Health Maintenance for Postmenopausal Women Menopause is a normal process in which your reproductive ability comes to an end. This process happens gradually over a span of months to years, usually between the ages of 22 and 9. Menopause is complete when you have missed 12 consecutive menstrual periods. It is important to talk with your health care provider about some of the most common conditions that affect postmenopausal women, such as heart disease, cancer, and bone loss (osteoporosis). Adopting a healthy lifestyle and getting preventive care can help to promote your health and wellness. Those actions can also lower your chances of developing some of these common conditions. What should I know about menopause? During menopause, you may experience a number of symptoms, such as:  Moderate-to-severe hot flashes.  Night sweats.  Decrease in sex drive.  Mood swings.  Headaches.  Tiredness.  Irritability.  Memory problems.  Insomnia.  Choosing to treat or not to treat menopausal changes is an individual decision that you make with your health care provider. What should I know about hormone replacement therapy and supplements? Hormone therapy products are effective for treating symptoms that are associated with menopause, such as hot flashes and night sweats. Hormone replacement carries certain risks, especially as you become older. If you are thinking about using estrogen or estrogen with progestin treatments, discuss the benefits and risks with your health care provider. What should I know about heart disease and stroke? Heart disease, heart attack, and stroke become more likely as you age. This may be due, in part, to the hormonal changes that your body experiences during menopause. These can affect how your body processes dietary fats, triglycerides, and cholesterol. Heart attack and stroke are both medical emergencies. There are many things that you can do to help prevent heart disease  and stroke:  Have your blood pressure checked at least every 1-2 years. High blood pressure causes heart disease and increases the risk of stroke.  If you are 53-22 years old, ask your health care provider if you should take aspirin to prevent a heart attack or a stroke.  Do not use any tobacco products, including cigarettes, chewing tobacco, or electronic cigarettes. If you need help quitting, ask your health care provider.  It is important to eat a healthy diet and maintain a healthy weight. ? Be sure to include plenty of vegetables, fruits, low-fat dairy products, and lean protein. ? Avoid eating foods that are high in solid fats, added sugars, or salt (sodium).  Get regular exercise. This is one of the most important things that you can do for your health. ? Try to exercise for at least 150 minutes each week. The type of exercise that you do should increase your heart rate and make you sweat. This is known as moderate-intensity exercise. ? Try to do strengthening exercises at least twice each week. Do these in addition to the moderate-intensity exercise.  Know your numbers.Ask your health care provider to check your cholesterol and your blood glucose. Continue to have your blood tested as directed by your health care provider.  What should I know about cancer screening? There are several types of cancer. Take the following steps to reduce your risk and to catch any cancer development as early as possible. Breast Cancer  Practice breast self-awareness. ? This means understanding how your breasts normally appear and feel. ? It also means doing regular breast self-exams. Let your health care provider know about any changes, no matter how small.  If you are 40  or older, have a clinician do a breast exam (clinical breast exam or CBE) every year. Depending on your age, family history, and medical history, it may be recommended that you also have a yearly breast X-ray (mammogram).  If you  have a family history of breast cancer, talk with your health care provider about genetic screening.  If you are at high risk for breast cancer, talk with your health care provider about having an MRI and a mammogram every year.  Breast cancer (BRCA) gene test is recommended for women who have family members with BRCA-related cancers. Results of the assessment will determine the need for genetic counseling and BRCA1 and for BRCA2 testing. BRCA-related cancers include these types: ? Breast. This occurs in males or females. ? Ovarian. ? Tubal. This may also be called fallopian tube cancer. ? Cancer of the abdominal or pelvic lining (peritoneal cancer). ? Prostate. ? Pancreatic.  Cervical, Uterine, and Ovarian Cancer Your health care provider may recommend that you be screened regularly for cancer of the pelvic organs. These include your ovaries, uterus, and vagina. This screening involves a pelvic exam, which includes checking for microscopic changes to the surface of your cervix (Pap test).  For women ages 21-65, health care providers may recommend a pelvic exam and a Pap test every three years. For women ages 79-65, they may recommend the Pap test and pelvic exam, combined with testing for human papilloma virus (HPV), every five years. Some types of HPV increase your risk of cervical cancer. Testing for HPV may also be done on women of any age who have unclear Pap test results.  Other health care providers may not recommend any screening for nonpregnant women who are considered low risk for pelvic cancer and have no symptoms. Ask your health care provider if a screening pelvic exam is right for you.  If you have had past treatment for cervical cancer or a condition that could lead to cancer, you need Pap tests and screening for cancer for at least 20 years after your treatment. If Pap tests have been discontinued for you, your risk factors (such as having a new sexual partner) need to be  reassessed to determine if you should start having screenings again. Some women have medical problems that increase the chance of getting cervical cancer. In these cases, your health care provider may recommend that you have screening and Pap tests more often.  If you have a family history of uterine cancer or ovarian cancer, talk with your health care provider about genetic screening.  If you have vaginal bleeding after reaching menopause, tell your health care provider.  There are currently no reliable tests available to screen for ovarian cancer.  Lung Cancer Lung cancer screening is recommended for adults 69-62 years old who are at high risk for lung cancer because of a history of smoking. A yearly low-dose CT scan of the lungs is recommended if you:  Currently smoke.  Have a history of at least 30 pack-years of smoking and you currently smoke or have quit within the past 15 years. A pack-year is smoking an average of one pack of cigarettes per day for one year.  Yearly screening should:  Continue until it has been 15 years since you quit.  Stop if you develop a health problem that would prevent you from having lung cancer treatment.  Colorectal Cancer  This type of cancer can be detected and can often be prevented.  Routine colorectal cancer screening usually begins at  age 42 and continues through age 45.  If you have risk factors for colon cancer, your health care provider may recommend that you be screened at an earlier age.  If you have a family history of colorectal cancer, talk with your health care provider about genetic screening.  Your health care provider may also recommend using home test kits to check for hidden blood in your stool.  A small camera at the end of a tube can be used to examine your colon directly (sigmoidoscopy or colonoscopy). This is done to check for the earliest forms of colorectal cancer.  Direct examination of the colon should be repeated every  5-10 years until age 71. However, if early forms of precancerous polyps or small growths are found or if you have a family history or genetic risk for colorectal cancer, you may need to be screened more often.  Skin Cancer  Check your skin from head to toe regularly.  Monitor any moles. Be sure to tell your health care provider: ? About any new moles or changes in moles, especially if there is a change in a mole's shape or color. ? If you have a mole that is larger than the size of a pencil eraser.  If any of your family members has a history of skin cancer, especially at a young age, talk with your health care provider about genetic screening.  Always use sunscreen. Apply sunscreen liberally and repeatedly throughout the day.  Whenever you are outside, protect yourself by wearing long sleeves, pants, a wide-brimmed hat, and sunglasses.  What should I know about osteoporosis? Osteoporosis is a condition in which bone destruction happens more quickly than new bone creation. After menopause, you may be at an increased risk for osteoporosis. To help prevent osteoporosis or the bone fractures that can happen because of osteoporosis, the following is recommended:  If you are 46-71 years old, get at least 1,000 mg of calcium and at least 600 mg of vitamin D per day.  If you are older than age 55 but younger than age 65, get at least 1,200 mg of calcium and at least 600 mg of vitamin D per day.  If you are older than age 54, get at least 1,200 mg of calcium and at least 800 mg of vitamin D per day.  Smoking and excessive alcohol intake increase the risk of osteoporosis. Eat foods that are rich in calcium and vitamin D, and do weight-bearing exercises several times each week as directed by your health care provider. What should I know about how menopause affects my mental health? Depression may occur at any age, but it is more common as you become older. Common symptoms of depression  include:  Low or sad mood.  Changes in sleep patterns.  Changes in appetite or eating patterns.  Feeling an overall lack of motivation or enjoyment of activities that you previously enjoyed.  Frequent crying spells.  Talk with your health care provider if you think that you are experiencing depression. What should I know about immunizations? It is important that you get and maintain your immunizations. These include:  Tetanus, diphtheria, and pertussis (Tdap) booster vaccine.  Influenza every year before the flu season begins.  Pneumonia vaccine.  Shingles vaccine.  Your health care provider may also recommend other immunizations. This information is not intended to replace advice given to you by your health care provider. Make sure you discuss any questions you have with your health care provider. Document Released: 02/23/2005  Document Revised: 07/22/2015 Document Reviewed: 10/05/2014 Elsevier Interactive Patient Education  2018 [employer]  

## 2016-10-17 LAB — IGP, COBASHPV16/18
HPV 16: NEGATIVE
HPV 18: NEGATIVE
HPV OTHER HR TYPES: NEGATIVE
PAP Smear Comment: 0

## 2016-11-09 ENCOUNTER — Other Ambulatory Visit: Payer: Self-pay

## 2016-11-09 DIAGNOSIS — M170 Bilateral primary osteoarthritis of knee: Secondary | ICD-10-CM

## 2016-11-09 MED ORDER — TRAMADOL HCL 50 MG PO TABS: 50.0000 mg | ORAL_TABLET | Freq: Every day | ORAL | 2 refills | Status: DC | PRN

## 2016-11-09 NOTE — Telephone Encounter (Signed)
Please see attached drug refill request

## 2016-11-09 NOTE — Telephone Encounter (Signed)
Copied from CRM #[id number]. Topic: Quick Communication - See Telephone Encounter >> Nov 09, 2016 11:46 AM Anice PaganiniMunoz, Riggin Cuttino I, NT wrote: CRM for notification. See Telephone encounter for:  11/09/16.pt need Rx of Tramadol

## 2016-11-28 ENCOUNTER — Telehealth: Payer: Self-pay | Admitting: Family Medicine

## 2016-11-28 NOTE — Telephone Encounter (Signed)
Copied from CRM #[id number]. >> Nov 28, 2016 12:05 PM Raquel SarnaHayes, Taylor G wrote:  Pt is concerned with no one being able to take her after Dr. Sherryll BurgerShah leaves.  She has an appt. With Dr. Sherryll BurgerShah in Dec.  Pt is upset about medications that will not be refilled:  Arthritis - Tramadol 50mg   Anxiety - Aprazolam 25 mg  Please call pt back with help with these concerns.

## 2016-11-30 NOTE — Telephone Encounter (Signed)
Left a message to give me a call back so we can discuss her concerns.

## 2016-12-21 ENCOUNTER — Encounter: Payer: Self-pay | Admitting: Family Medicine

## 2016-12-21 ENCOUNTER — Ambulatory Visit (INDEPENDENT_AMBULATORY_CARE_PROVIDER_SITE_OTHER): Payer: 59 | Admitting: Family Medicine

## 2016-12-21 VITALS — BP 136/82 | HR 78 | Temp 97.9°F | Resp 14 | Ht 61.0 in | Wt 215.4 lb

## 2016-12-21 DIAGNOSIS — M170 Bilateral primary osteoarthritis of knee: Secondary | ICD-10-CM | POA: Diagnosis not present

## 2016-12-21 DIAGNOSIS — I10 Essential (primary) hypertension: Secondary | ICD-10-CM

## 2016-12-21 DIAGNOSIS — E7800 Pure hypercholesterolemia, unspecified: Secondary | ICD-10-CM

## 2016-12-21 LAB — LIPID PANEL
CHOL/HDL RATIO: 3.5 (calc) (ref <5.0)
Cholesterol: 216 mg/dL — ABNORMAL HIGH (ref <200)
HDL: 62 mg/dL (ref >50)
LDL Cholesterol (Calc): 133 mg/dL (calc) — ABNORMAL HIGH
NON-HDL CHOLESTEROL (CALC): 154 mg/dL — AB (ref <130)
Triglycerides: 99 mg/dL (ref <150)

## 2016-12-21 LAB — COMPLETE METABOLIC PANEL WITH GFR
AG RATIO: 1.5 (calc) (ref 1.0–2.5)
ALT: 18 U/L (ref 6–29)
AST: 17 U/L (ref 10–35)
Albumin: 4.1 g/dL (ref 3.6–5.1)
Alkaline phosphatase (APISO): 62 U/L (ref 33–130)
BUN: 18 mg/dL (ref 7–25)
CALCIUM: 9.8 mg/dL (ref 8.6–10.4)
CO2: 33 mmol/L — AB (ref 20–32)
CREATININE: 0.84 mg/dL (ref 0.50–1.05)
Chloride: 100 mmol/L (ref 98–110)
GFR, EST AFRICAN AMERICAN: 93 mL/min/{1.73_m2} (ref >=60)
GFR, EST NON AFRICAN AMERICAN: 80 mL/min/{1.73_m2} (ref >=60)
Globulin: 2.8 g/dL (calc) (ref 1.9–3.7)
Glucose, Bld: 99 mg/dL (ref 65–99)
POTASSIUM: 3.6 mmol/L (ref 3.5–5.3)
Sodium: 141 mmol/L (ref 135–146)
TOTAL PROTEIN: 6.9 g/dL (ref 6.1–8.1)
Total Bilirubin: 0.5 mg/dL (ref 0.2–1.2)

## 2016-12-21 MED ORDER — VALSARTAN-HYDROCHLOROTHIAZIDE 320-25 MG PO TABS: 1.0000 | ORAL_TABLET | Freq: Every day | ORAL | 1 refills | Status: DC

## 2016-12-21 MED ORDER — AMLODIPINE BESYLATE 10 MG PO TABS: 10.0000 mg | ORAL_TABLET | Freq: Every day | ORAL | 1 refills | Status: DC

## 2016-12-21 MED ORDER — TRAMADOL HCL 50 MG PO TABS: 50.0000 mg | ORAL_TABLET | Freq: Every day | ORAL | 2 refills | Status: DC | PRN

## 2016-12-21 NOTE — Progress Notes (Signed)
Name: Taylor GibsonJustina F Hurst   MRN: [id number]    DOB: 01/19/1965   Date:12/21/2016       Progress Note  Subjective  Chief Complaint  Chief Complaint  Patient presents with  . Follow-up    6 months   . Medication Refill    Hypertension  This is a chronic problem. The problem is unchanged. The problem is controlled. Pertinent negatives include no blurred vision, chest pain or headaches. Past treatments include angiotensin blockers, diuretics and calcium channel blockers. There is no history of kidney disease, CAD/MI or CVA.  Knee Pain   The pain is present in the right knee and left knee. The pain is moderate. Pertinent negatives include no loss of sensation or muscle weakness. The symptoms are aggravated by weight bearing (worse with being on her feet all day). She has tried acetaminophen for the symptoms.     Past Medical History:  Diagnosis Date  . Achilles tendinitis 09/23/2014   Right leg   . Arthritis   . Chronic cholecystitis 05/30/2011  . Gastric ulcer   . GERD (gastroesophageal reflux disease)    hx of pt currently has no issues since gallbladder surgery   . Heart murmur   . Hepatitis    2013 chronic active hepatitis during gallbladder issues  . History of blood transfusion    [id number]  . Hypertension   . Nausea   . Osteomyelitis (HCC)    1983 right leg;1988 left leg  . Torn rotator cuff 09/23/2014   Right side     Past Surgical History:  Procedure Laterality Date  . CHOLECYSTECTOMY  07/26/11  . INTRAOPERATIVE CHOLANGIOGRAM  07/26/2011   Procedure: INTRAOPERATIVE CHOLANGIOGRAM;  Surgeon: Ardeth SportsmanSteven C. Gross, MD;  Location: [facility];  Service: General;  Laterality: N/A;  . LIVER BIOPSY  07/26/2011   Procedure: LIVER BIOPSY;  Surgeon: Ardeth SportsmanSteven C. Gross, MD;  Location: [facility];  Service: General;  Laterality: N/A;  core liver biopsy   . osteomilitis     right and left leg/surgery; right 1983; left 1988  . osteomylitis  [id number]   right leg, left leg  . SHOULDER OPEN ROTATOR CUFF REPAIR  Right 11/18/2014   Procedure: RIGHT SHOULDER MINI OPEN ROTATOR CUFF REPAIR SUBACROMIAL DECOMPRESSION  ;  Surgeon: Jene EveryJeffrey Beane, MD;  Location: [facility];  Service: Orthopedics;  Laterality: Right;    Family History  Problem Relation Age of Onset  . COPD Mother   . Hypertension Mother   . Hyperlipidemia Mother   . Alcohol abuse Father   . Cancer Maternal Aunt        breast  . Breast cancer Paternal Aunt     Social History   Socioeconomic History  . Marital status: Married    Spouse name: Not on file  . Number of children: Not on file  . Years of education: Not on file  . Highest education level: Not on file  Social Needs  . Financial resource strain: Not on file  . Food insecurity - worry: Not on file  . Food insecurity - inability: Not on file  . Transportation needs - medical: Not on file  . Transportation needs - non-medical: Not on file  Occupational History  . Not on file  Tobacco Use  . Smoking status: Never Smoker  . Smokeless tobacco: Never Used  Substance and Sexual Activity  . Alcohol use: Yes    Alcohol/week: 0.0 oz    Comment: occas  . Drug use: No  . Sexual activity: Yes  Birth control/protection: IUD    Comment: Mirena  Other Topics Concern  . Not on file  Social History Narrative  . Not on file     Current Outpatient Medications:  .  ALPRAZolam (XANAX) 0.25 MG tablet, Take 1 tablet (0.25 mg total) by mouth 2 (two) times daily as needed for anxiety., Disp: 60 tablet, Rfl: 2 .  amLODipine (NORVASC) 10 MG tablet, Take 1 tablet (10 mg total) by mouth daily., Disp: 90 tablet, Rfl: 1 .  b complex vitamins tablet, Take 1 tablet by mouth daily., Disp: , Rfl:  .  BIOTIN PO, Take 10,000 mg by mouth daily., Disp: , Rfl:  .  Black Cohosh 540 MG CAPS, Take 540 mg by mouth daily. , Disp: , Rfl:  .  FIBER PO, Take 5 tablets by mouth 2 (two) times daily., Disp: , Rfl:  .  levonorgestrel (MIRENA) 20 MCG/24HR IUD, 1 each by Intrauterine route once. , Disp: , Rfl:   .  Melatonin 5 MG CAPS, Take 10 mg by mouth at bedtime. , Disp: , Rfl:  .  Multiple Vitamin (MULTI-VITAMIN DAILY PO), Take 1 tablet by mouth daily with breakfast. Women's, Disp: , Rfl:  .  Omega-3 Fatty Acids (FISH OIL) 1000 MG CAPS, Take 1,000 mg by mouth 2 (two) times daily., Disp: , Rfl:  .  omeprazole (PRILOSEC) 40 MG capsule, , Disp: , Rfl:  .  traMADol (ULTRAM) 50 MG tablet, Take 1 tablet (50 mg total) by mouth daily as needed., Disp: 30 tablet, Rfl: 2 .  valsartan-hydrochlorothiazide (DIOVAN-HCT) 320-25 MG tablet, Take 1 tablet by mouth daily., Disp: 90 tablet, Rfl: 1 .  ketotifen (ZADITOR) 0.025 % ophthalmic solution, 1 drop 2 (two) times daily., Disp: , Rfl:   Allergies  Allergen Reactions  . Percocet [Oxycodone-Acetaminophen] Anaphylaxis    bronchospasms  . Adhesive [Tape] Itching  . Allergy Relief  [Chlorpheniramine Maleate]     Heart flutters  . Betadine  [Povidone Iodine]     blisters blisters  . Other     Betadine=blisters skin  ,Cashew Nuts,sunflower seeds-swelling and itching  . Penicillins Hives    Has patient had a PCN reaction causing immediate rash, facial/tongue/throat swelling, SOB or lightheadedness with hypotension: no Has patient had a PCN reaction causing severe rash involving mucus membranes or skin necrosis: no Has patient had a PCN reaction that required hospitalization happened in the hospital Has patient had a PCN reaction occurring within the last 10 years: no If all of the above answers are "NO", then may proceed with Cephalosporin use.      Review of Systems  Eyes: Negative for blurred vision.  Cardiovascular: Negative for chest pain.  Neurological: Negative for headaches.     Objective  Vitals:   12/21/16 1028  BP: 136/82  Pulse: 78  Resp: 14  Temp: 97.9 F (36.6 C)  TempSrc: Oral  SpO2: 97%  Weight: 215 lb 6.4 oz (97.7 kg)  Height: 5\' 1"  (1.549 m)    Physical Exam  Constitutional: She is oriented to person, place, and time and  well-developed, well-nourished, and in no distress.  HENT:  Head: Normocephalic and atraumatic.  Cardiovascular: Normal rate and regular rhythm.  Pulmonary/Chest: Effort normal and breath sounds normal.  Abdominal: Soft. Bowel sounds are normal.  Musculoskeletal:       Right knee: She exhibits no swelling. No tenderness found.       Left knee: She exhibits no swelling. No tenderness found.  Crepitus in both knees on gentle  ROM   Neurological: She is alert and oriented to person, place, and time.  Nursing note and vitals reviewed.    Recent Results (from the past 2160 hour(s))  IGP, cobasHPV16/18     Status: None   Collection Time: 10/12/16 11:25 AM  Result Value Ref Range   DIAGNOSIS: Comment     Comment: NEGATIVE FOR INTRAEPITHELIAL LESION AND MALIGNANCY.   Specimen adequacy: Comment     Comment: Satisfactory for evaluation. Endocervical and/or squamous metaplastic cells (endocervical component) are present.    Clinician Provided ICD10 Comment     Comment: Z01.419 Z12.4    Performed by: Comment     Comment: Adine Madura, Cytotechnologist (ASCP)   QC reviewed by: Comment     Comment: Odis Luster, Cytotechnologist (ASCP)   PAP Smear Comment .    Note: Comment     Comment: The Pap smear is a screening test designed to aid in the detection of premalignant and malignant conditions of the uterine cervix.  It is not a diagnostic procedure and should not be used as the sole means of detecting cervical cancer.  Both false-positive and false-negative reports do occur.    Test Methodology Comment     Comment: This liquid based ThinPrep(R) pap test was screened with the use of an image guided system.    HPV other hr types Negative Negative   HPV 16 Negative Negative   HPV 18 Negative Negative    Comment: This test detects fourteen high-risk HPV types: HPV16, HPV18 and twelve other high-risk types (31, 33, 35, 39, 45, 51, 52, 56, 58, 59, 66, 68) without differentiation.       Assessment & Plan  1. Essential hypertension BP stable on present antihypertensive treatment - amLODipine (NORVASC) 10 MG tablet; Take 1 tablet (10 mg total) by mouth daily.  Dispense: 90 tablet; Refill: 1 - valsartan-hydrochlorothiazide (DIOVAN-HCT) 320-25 MG tablet; Take 1 tablet by mouth daily.  Dispense: 90 tablet; Refill: 1 - COMPLETE METABOLIC PANEL WITH GFR  2. Primary osteoarthritis of both knees Chronic pain associated with osteoarthritis, tramadol provided, informed that after my departure from practice, she will likely need to find either a new pain management provider  or PCP for management of chronic knee pain. - traMADol (ULTRAM) 50 MG tablet; Take 1 tablet (50 mg total) by mouth daily as needed.  Dispense: 30 tablet; Refill: 2  3. Hypercholesterolemia  - Lipid panel   Pio Eatherly Asad [facility] 12/21/2016 10:47 AM

## 2016-12-21 NOTE — Progress Notes (Signed)
[facility] [place] [facility]

## 2017-03-06 ENCOUNTER — Encounter: Payer: Self-pay | Admitting: Family Medicine

## 2017-03-18 ENCOUNTER — Other Ambulatory Visit: Payer: Self-pay | Admitting: Family Medicine

## 2017-03-18 ENCOUNTER — Encounter: Payer: Self-pay | Admitting: Family Medicine

## 2017-03-18 DIAGNOSIS — G8929 Other chronic pain: Secondary | ICD-10-CM

## 2017-03-18 NOTE — Progress Notes (Unsigned)
Patient checking to see if this one listed below is one that she could see, [facility]. Call back # [phone]. Patient is also requesting a refill on ALPRAZolam (XANAX) 0.25 MG tablet. Please advise. [facility] [place]

## 2017-03-20 ENCOUNTER — Telehealth: Payer: Self-pay

## 2017-03-20 NOTE — Telephone Encounter (Signed)
Copied from CRM [id number]. Topic: Referral - Question >> Mar 20, 2017  9:39 AM Diana EvesHoyt, Maryann B wrote: Reason for CRM: Cora with Dr. Milta DeitersKhan's office regarding the referral on pt. They are just wanting to make the office aware they are not accepting new pts at this time. Call back: [phone]   Patient has been [facility] to the [facility] instead.

## 2017-03-28 ENCOUNTER — Ambulatory Visit (INDEPENDENT_AMBULATORY_CARE_PROVIDER_SITE_OTHER): Payer: 59 | Admitting: Family Medicine

## 2017-03-28 ENCOUNTER — Encounter: Payer: Self-pay | Admitting: Family Medicine

## 2017-03-28 VITALS — BP 144/90 | HR 73 | Resp 14 | Ht 61.0 in | Wt 218.0 lb

## 2017-03-28 DIAGNOSIS — F411 Generalized anxiety disorder: Secondary | ICD-10-CM | POA: Diagnosis not present

## 2017-03-28 DIAGNOSIS — E7800 Pure hypercholesterolemia, unspecified: Secondary | ICD-10-CM | POA: Diagnosis not present

## 2017-03-28 DIAGNOSIS — M79672 Pain in left foot: Secondary | ICD-10-CM

## 2017-03-28 DIAGNOSIS — K219 Gastro-esophageal reflux disease without esophagitis: Secondary | ICD-10-CM | POA: Diagnosis not present

## 2017-03-28 DIAGNOSIS — R739 Hyperglycemia, unspecified: Secondary | ICD-10-CM

## 2017-03-28 DIAGNOSIS — M170 Bilateral primary osteoarthritis of knee: Secondary | ICD-10-CM

## 2017-03-28 DIAGNOSIS — I10 Essential (primary) hypertension: Secondary | ICD-10-CM

## 2017-03-28 MED ORDER — AMLODIPINE BESYLATE 10 MG PO TABS: 10.0000 mg | ORAL_TABLET | Freq: Every day | ORAL | 1 refills | Status: DC

## 2017-03-28 MED ORDER — ESCITALOPRAM OXALATE 10 MG PO TABS: 10.0000 mg | ORAL_TABLET | Freq: Every day | ORAL | 0 refills | Status: DC

## 2017-03-28 MED ORDER — VALSARTAN-HYDROCHLOROTHIAZIDE 320-25 MG PO TABS: 1.0000 | ORAL_TABLET | Freq: Every day | ORAL | 1 refills | Status: DC

## 2017-03-28 MED ORDER — ASPIRIN EC 81 MG PO TBEC: 81.0000 mg | DELAYED_RELEASE_TABLET | Freq: Every day | ORAL | 0 refills | Status: DC

## 2017-03-28 MED ORDER — TRAMADOL HCL 50 MG PO TABS: 50.0000 mg | ORAL_TABLET | Freq: Every day | ORAL | 2 refills | Status: DC | PRN

## 2017-03-28 MED ORDER — DICLOFENAC SODIUM 1 % TD GEL: 4.0000 g | Freq: Four times a day (QID) | TRANSDERMAL | 1 refills | Status: DC

## 2017-03-28 NOTE — Progress Notes (Signed)
Name: Taylor Hurst   MRN: [id number]    DOB: 1965/10/26   Date:03/28/2017       Progress Note  Subjective  Chief Complaint  Chief Complaint  Patient presents with  . Hypertension    HPI  HTN: bp was elevated today, but states usually at goal, new patient to me today - and was nervous. She has some occasional chest tightness when anxious but no palpitation, had echo before. Advised aspirin daily   Obesity: she states she developed obesity after second child, went from 167 lbs to 225 lbs during pregnancy. She has OA and is not very active  OA: she is taking Tylenol, but also wants to have Tramadol at night, she has been taking for many years. She had daily aching pain. Worse when working because she has to stand all day. She states symptoms are worse at the end of work day. She has episode of knee effusion. No redness  GAD: she takes alprazolam prn, we will try lexapro discussed possible side effects.   GERD: she is doing well on PPI, discussed long term risk of PPI use.   Patient Active Problem List   Diagnosis Date Noted  . Morbid (severe) obesity due to excess calories (HCC) 03/28/2017  . Hyperglycemia 12/22/2015  . Tendonitis, Achilles, right 01/11/2015  . Anxiety disorder 12/07/2014  . Complete rotator cuff tear 11/18/2014  . Hypercholesterolemia 07/30/2014  . Osteoarthritis of both knees 07/30/2014  . Chronic constipation 08/29/2011  . HTN (hypertension) 05/30/2011  . Insomnia 05/30/2011  . Obesity (BMI 30-39.9) 05/30/2011  . GERD (gastroesophageal reflux disease) 05/30/2011    Past Surgical History:  Procedure Laterality Date  . CHOLECYSTECTOMY  07/26/11  . INTRAOPERATIVE CHOLANGIOGRAM  07/26/2011   Procedure: INTRAOPERATIVE CHOLANGIOGRAM;  Surgeon: Ardeth Sportsman, MD;  Location: [facility];  Service: General;  Laterality: N/A;  . LIVER BIOPSY  07/26/2011   Procedure: LIVER BIOPSY;  Surgeon: Ardeth Sportsman, MD;  Location: [facility];  Service: General;  Laterality: N/A;   core liver biopsy   . osteomilitis     right and left leg/surgery; right 1983; left 1988  . osteomylitis  4098,1191   right leg, left leg  . SHOULDER OPEN ROTATOR CUFF REPAIR Right 11/18/2014   Procedure: RIGHT SHOULDER MINI OPEN ROTATOR CUFF REPAIR SUBACROMIAL DECOMPRESSION  ;  Surgeon: Jene Every, MD;  Location: [facility];  Service: Orthopedics;  Laterality: Right;    Family History  Problem Relation Age of Onset  . COPD Mother   . Hypertension Mother   . Hyperlipidemia Mother   . Alcohol abuse Father   . Cancer Maternal Aunt        breast  . Breast cancer Paternal Aunt     Social History   Socioeconomic History  . Marital status: Married    Spouse name: Not on file  . Number of children: Not on file  . Years of education: Not on file  . Highest education level: Not on file  Social Needs  . Financial resource strain: Not on file  . Food insecurity - worry: Not on file  . Food insecurity - inability: Not on file  . Transportation needs - medical: Not on file  . Transportation needs - non-medical: Not on file  Occupational History  . Not on file  Tobacco Use  . Smoking status: Never Smoker  . Smokeless tobacco: Never Used  Substance and Sexual Activity  . Alcohol use: Yes    Alcohol/week: 0.0 oz  Comment: occas  . Drug use: No  . Sexual activity: Yes    Birth control/protection: IUD    Comment: Mirena  Other Topics Concern  . Not on file  Social History Narrative  . Not on file     Current Outpatient Medications:  .  amLODipine (NORVASC) 10 MG tablet, Take 1 tablet (10 mg total) by mouth daily., Disp: 90 tablet, Rfl: 1 .  aspirin EC 81 MG tablet, Take 1 tablet (81 mg total) by mouth daily., Disp: 30 tablet, Rfl: 0 .  diclofenac sodium (VOLTAREN) 1 % GEL, Apply 4 g topically 4 (four) times daily., Disp: 100 g, Rfl: 1 .  escitalopram (LEXAPRO) 10 MG tablet, Take 1 tablet (10 mg total) by mouth daily., Disp: 30 tablet, Rfl: 0 .  FIBER PO, Take 5 tablets by  mouth 2 (two) times daily., Disp: , Rfl:  .  ketotifen (ZADITOR) 0.025 % ophthalmic solution, 1 drop 2 (two) times daily., Disp: , Rfl:  .  levonorgestrel (MIRENA) 20 MCG/24HR IUD, 1 each by Intrauterine route once. , Disp: , Rfl:  .  Melatonin 5 MG CAPS, Take 10 mg by mouth at bedtime. , Disp: , Rfl:  .  traMADol (ULTRAM) 50 MG tablet, Take 1 tablet (50 mg total) by mouth daily as needed., Disp: 30 tablet, Rfl: 2 .  valsartan-hydrochlorothiazide (DIOVAN-HCT) 320-25 MG tablet, Take 1 tablet by mouth daily., Disp: 90 tablet, Rfl: 1 .  b complex vitamins tablet, Take 1 tablet by mouth daily., Disp: , Rfl:  .  BIOTIN PO, Take 10,000 mg by mouth daily., Disp: , Rfl:  .  Black Cohosh 540 MG CAPS, Take 540 mg by mouth daily. , Disp: , Rfl:  .  Multiple Vitamin (MULTI-VITAMIN DAILY PO), Take 1 tablet by mouth daily with breakfast. Women's, Disp: , Rfl:  .  Omega-3 Fatty Acids (FISH OIL) 1000 MG CAPS, Take 1,000 mg by mouth 2 (two) times daily., Disp: , Rfl:  .  omeprazole (PRILOSEC) 40 MG capsule, , Disp: , Rfl:   Allergies  Allergen Reactions  . Percocet [Oxycodone-Acetaminophen] Anaphylaxis    bronchospasms  . Adhesive [Tape] Itching  . Allergy Relief  [Chlorpheniramine Maleate]     Heart flutters  . Betadine  [Povidone Iodine]     blisters blisters  . Other     Betadine=blisters skin  ,Cashew Nuts,sunflower seeds-swelling and itching  . Penicillins Hives    Has patient had a PCN reaction causing immediate rash, facial/tongue/throat swelling, SOB or lightheadedness with hypotension: no Has patient had a PCN reaction causing severe rash involving mucus membranes or skin necrosis: no Has patient had a PCN reaction that required hospitalization happened in the hospital Has patient had a PCN reaction occurring within the last 10 years: no If all of the above answers are "NO", then may proceed with Cephalosporin use.      ROS  Constitutional: Negative for fever or weight change.   Respiratory: Negative for cough and shortness of breath.   Cardiovascular: Negative for chest pain ( tightness when she is anxious)  But no palpitations.  Gastrointestinal: Negative for abdominal pain, no bowel changes.  Musculoskeletal: Negative for gait problem or joint swelling.  Skin: Negative for rash.   Neurological: Negative for dizziness or headache.  No other specific complaints in a complete review of systems (except as listed in HPI above).  Objective  Vitals:   03/28/17 1025  BP: (!) 144/90  Pulse: 73  Resp: 14  SpO2: 97%  Weight:  218 lb (98.9 kg)  Height: 5\' 1"  (1.549 m)    Body mass index is 41.19 kg/m.  Physical Exam  Constitutional: Patient appears well-developed and well-nourished. Obese  No distress.  HEENT: head atraumatic, normocephalic, pupils equal and reactive to light,  neck supple, throat within normal limits Cardiovascular: Normal rate, regular rhythm and normal heart sounds.  No murmur heard. No BLE edema. Pulmonary/Chest: Effort normal and breath sounds normal. No respiratory distress. Abdominal: Soft.  There is no tenderness. Psychiatric: Patient has a normal mood and affect. behavior is normal. Judgment and thought content normal. Muscular Skeletal: crepitus with extension of both knees, left worse than right.   PHQ2/9: Depression screen [facility] 2/9 03/28/2017 12/21/2016 06/21/2016 03/22/2016 12/22/2015  Decreased Interest 1 0 0 0 0  Down, Depressed, Hopeless 1 0 0 0 0  PHQ - 2 Score 2 0 0 0 0  Altered sleeping 1 - - - -  Tired, decreased energy 1 - - - -  Change in appetite 1 - - - -  Feeling bad or failure about yourself  0 - - - -  Trouble concentrating 0 - - - -  Moving slowly or fidgety/restless 0 - - - -  Suicidal thoughts 1 - - - -  PHQ-9 Score 6 - - - -  Difficult doing work/chores Somewhat difficult - - - -   GAD 7 : Generalized Anxiety Score 03/28/2017  Nervous, Anxious, on Edge 3  Control/stop worrying 1  Worry too much - different  things 3  Trouble relaxing 1  Restless 1  Easily annoyed or irritable 1  Afraid - awful might happen 1  Total GAD 7 Score 11  Anxiety Difficulty Somewhat difficult      Fall Risk: Fall Risk  03/28/2017 12/21/2016 06/21/2016 03/22/2016 12/22/2015  Falls in the past year? No No No No No     Functional Status Survey: Is the patient deaf or have difficulty hearing?: No Does the patient have difficulty seeing, even when wearing glasses/contacts?: No Does the patient have difficulty concentrating, remembering, or making decisions?: No Does the patient have difficulty walking or climbing stairs?: No Does the patient have difficulty dressing or bathing?: No Does the patient have difficulty doing errands alone such as visiting a doctor's office or shopping?: No    Assessment & Plan  1. Morbid (severe) obesity due to excess calories [facility])  Discussed with the patient the risk posed by an increased BMI. Discussed importance of portion control, calorie counting and at least 150 minutes of physical activity weekly. Avoid sweet beverages and drink more water. Eat at least 6 servings of fruit and vegetables daily   2. Hypercholesterolemia  Discussed adding statin therapy , ASCVD 5.7% , but advised aspirin 81 mg daily   3. Primary osteoarthritis of both knees  - traMADol (ULTRAM) 50 MG tablet; Take 1 tablet (50 mg total) by mouth daily as needed.  Dispense: 30 tablet; Refill: 2  4. Essential hypertension  - amLODipine (NORVASC) 10 MG tablet; Take 1 tablet (10 mg total) by mouth daily.  Dispense: 90 tablet; Refill: 1 - valsartan-hydrochlorothiazide (DIOVAN-HCT) 320-25 MG tablet; Take 1 tablet by mouth daily.  Dispense: 90 tablet; Refill: 1  5. Hyperglycemia  Recheck labs next visit   6. Generalized anxiety disorder  Advised to stop alprazolam and try lexapro daily she will return in 1 month  - escitalopram (LEXAPRO) 10 MG tablet; Take 1 tablet (10 mg total) by mouth daily.  Dispense: 30  tablet; Refill:  0  7. Gastroesophageal reflux disease without esophagitis  Sees Gastroenterologist   8. Acute foot pain, left  Going on for the past 2 weeks, top of left foot, no injury - diclofenac sodium (VOLTAREN) 1 % GEL; Apply 4 g topically 4 (four) times daily.  Dispense: 100 g; Refill: 1

## 2017-03-28 NOTE — Patient Instructions (Signed)

## 2017-04-24 ENCOUNTER — Other Ambulatory Visit: Payer: Self-pay | Admitting: Family Medicine

## 2017-04-24 DIAGNOSIS — F411 Generalized anxiety disorder: Secondary | ICD-10-CM

## 2017-04-24 NOTE — Telephone Encounter (Signed)
Refill request for general medication: Lexapro 10 mg  Last office visit: 03/28/2017  Last physical exam: None indicated  Follow up visit: 05/09/2017

## 2017-04-24 NOTE — Telephone Encounter (Signed)
Reviewed last note; patient has upcoming appointment Approved Rx

## 2017-05-09 ENCOUNTER — Ambulatory Visit (INDEPENDENT_AMBULATORY_CARE_PROVIDER_SITE_OTHER): Payer: 59 | Admitting: Family Medicine

## 2017-05-09 ENCOUNTER — Encounter: Payer: Self-pay | Admitting: Family Medicine

## 2017-05-09 VITALS — BP 116/70 | HR 86 | Temp 98.2°F | Resp 18 | Ht 61.0 in | Wt 217.8 lb

## 2017-05-09 DIAGNOSIS — I10 Essential (primary) hypertension: Secondary | ICD-10-CM | POA: Diagnosis not present

## 2017-05-09 DIAGNOSIS — M170 Bilateral primary osteoarthritis of knee: Secondary | ICD-10-CM

## 2017-05-09 DIAGNOSIS — F411 Generalized anxiety disorder: Secondary | ICD-10-CM

## 2017-05-09 DIAGNOSIS — F320 Major depressive disorder, single episode, mild: Secondary | ICD-10-CM

## 2017-05-09 MED ORDER — HYDROXYZINE HCL 10 MG PO TABS: 10.0000 mg | ORAL_TABLET | Freq: Every day | ORAL | 0 refills | Status: DC | PRN

## 2017-05-09 MED ORDER — ESCITALOPRAM OXALATE 10 MG PO TABS: 10.0000 mg | ORAL_TABLET | Freq: Every day | ORAL | 1 refills | Status: DC

## 2017-05-09 NOTE — Progress Notes (Addendum)
Name: Taylor Hurst   MRN: [id number]    DOB: [birth year]   Date:05/09/2017       Progress Note  Subjective  Chief Complaint  Chief Complaint  Patient presents with  . Follow-up    6 week F/U  . Anxiety    Started Lexapro but does not see any improvement-states Alprazolam helped her sleep and deal with her anxiety.    HPI  HTN: bp is at goal today. She denies chest pain or palpitation   Obesity: she states she developed obesity after second child, went from 167 lbs to 225 lbs during pregnancy. Discussed portion size, explained weight loss will help with knee pain  OA: she is taking Tylenol during the day and tramadol qhs, she has been taking for many years. She had daily aching pain, worse at the end of work day, sometimes has right knee effusion, but not now. Voltaren gel seems to help also  GAD: she takes alprazolam prn, we changed to Lexapro but she is not sure if working, using melatonin to sleep but does not work very well, we will try adding atarax, she will call if it helps.   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    Patient Active Problem List   Diagnosis Date Noted  . Morbid (severe) obesity due to excess calories (HCC) 03/28/2017  . Hyperglycemia 12/22/2015  . Tendonitis, Achilles, right 01/11/2015  . Anxiety disorder 12/07/2014  . Complete rotator cuff tear 11/18/2014  . Hypercholesterolemia 07/30/2014  . Osteoarthritis of both knees 07/30/2014  . Chronic constipation 08/29/2011  . HTN (hypertension) 05/30/2011  . Insomnia 05/30/2011  . Obesity (BMI 30-39.9) 05/30/2011  . GERD (gastroesophageal reflux disease) 05/30/2011    Past Surgical History:  Procedure Laterality Date  . CHOLECYSTECTOMY  07/26/11  . INTRAOPERATIVE CHOLANGIOGRAM  07/26/2011   Procedure: INTRAOPERATIVE CHOLANGIOGRAM;  Surgeon: Ardeth Sportsman, MD;  Location: [facility];  Service: General;  Laterality: N/A;  . LIVER BIOPSY   07/26/2011   Procedure: LIVER BIOPSY;  Surgeon: Ardeth Sportsman, MD;  Location: [facility];  Service: General;  Laterality: N/A;  core liver biopsy   . osteomilitis     right and left leg/surgery; right 1983; left 1988  . osteomylitis  [id number]   right leg, left leg  . SHOULDER OPEN ROTATOR CUFF REPAIR Right 11/18/2014   Procedure: RIGHT SHOULDER MINI OPEN ROTATOR CUFF REPAIR SUBACROMIAL DECOMPRESSION  ;  Surgeon: Jene Every, MD;  Location: [facility];  Service: Orthopedics;  Laterality: Right;    Family History  Problem Relation Age of Onset  . COPD Mother   . Hypertension Mother   . Hyperlipidemia Mother   . Alcohol abuse Father   . Cancer Maternal Aunt        breast  . Breast cancer Paternal Aunt     Social History   Socioeconomic History  . Marital status: Married    Spouse name: Not on file  . Number of children: Not on file  . Years of education: Not on file  . Highest education level: Not on file  Occupational History  . Not on file  Social Needs  . Financial resource strain: Not on file  . Food insecurity:    Worry: Not on file    Inability: Not on file  . Transportation needs:    Medical: Not on file    Non-medical: Not on file  Tobacco Use  .  Smoking status: Never Smoker  . Smokeless tobacco: Never Used  Substance and Sexual Activity  . Alcohol use: Yes    Alcohol/week: 0.0 oz    Comment: occas  . Drug use: No  . Sexual activity: Yes    Birth control/protection: IUD    Comment: Mirena  Lifestyle  . Physical activity:    Days per week: Not on file    Minutes per session: Not on file  . Stress: Not on file  Relationships  . Social connections:    Talks on phone: Not on file    Gets together: Not on file    Attends religious service: Not on file    Active member of club or organization: Not on file    Attends meetings of clubs or organizations: Not on file    Relationship status: Not on file  . Intimate partner violence:    Fear of current or ex  partner: Not on file    Emotionally abused: Not on file    Physically abused: Not on file    Forced sexual activity: Not on file  Other Topics Concern  . Not on file  Social History Narrative  . Not on file     Current Outpatient Medications:  .  amLODipine (NORVASC) 10 MG tablet, Take 1 tablet (10 mg total) by mouth daily., Disp: 90 tablet, Rfl: 1 .  aspirin EC 81 MG tablet, Take 1 tablet (81 mg total) by mouth daily., Disp: 30 tablet, Rfl: 0 .  diclofenac sodium (VOLTAREN) 1 % GEL, Apply 4 g topically 4 (four) times daily., Disp: 100 g, Rfl: 1 .  escitalopram (LEXAPRO) 10 MG tablet, Take 1 tablet (10 mg total) by mouth daily., Disp: 90 tablet, Rfl: 1 .  FIBER PO, Take 5 tablets by mouth 2 (two) times daily., Disp: , Rfl:  .  ketotifen (ZADITOR) 0.025 % ophthalmic solution, 1 drop 2 (two) times daily., Disp: , Rfl:  .  levonorgestrel (MIRENA) 20 MCG/24HR IUD, 1 each by Intrauterine route once. , Disp: , Rfl:  .  Melatonin 5 MG CAPS, Take 10 mg by mouth at bedtime. , Disp: , Rfl:  .  traMADol (ULTRAM) 50 MG tablet, Take 1 tablet (50 mg total) by mouth daily as needed., Disp: 30 tablet, Rfl: 2 .  valsartan-hydrochlorothiazide (DIOVAN-HCT) 320-25 MG tablet, Take 1 tablet by mouth daily., Disp: 90 tablet, Rfl: 1 .  b complex vitamins tablet, Take 1 tablet by mouth daily., Disp: , Rfl:  .  BIOTIN PO, Take 10,000 mg by mouth daily., Disp: , Rfl:  .  Black Cohosh 540 MG CAPS, Take 540 mg by mouth daily. , Disp: , Rfl:  .  hydrOXYzine (ATARAX/VISTARIL) 10 MG tablet, Take 1 tablet (10 mg total) by mouth daily as needed., Disp: 30 tablet, Rfl: 0 .  Multiple Vitamin (MULTI-VITAMIN DAILY PO), Take 1 tablet by mouth daily with breakfast. Women's, Disp: , Rfl:  .  Omega-3 Fatty Acids (FISH OIL) 1000 MG CAPS, Take 1,000 mg by mouth 2 (two) times daily., Disp: , Rfl:  .  omeprazole (PRILOSEC) 40 MG capsule, , Disp: , Rfl:   Allergies  Allergen Reactions  . Percocet [Oxycodone-Acetaminophen]  Anaphylaxis    bronchospasms  . Adhesive [Tape] Itching  . Allergy Relief  [Chlorpheniramine Maleate]     Heart flutters  . Betadine  [Povidone Iodine]     blisters blisters  . Other     Betadine=blisters skin  ,Cashew Nuts,sunflower seeds-swelling and itching  . Penicillins  Hives    Has patient had a PCN reaction causing immediate rash, facial/tongue/throat swelling, SOB or lightheadedness with hypotension: no Has patient had a PCN reaction causing severe rash involving mucus membranes or skin necrosis: no Has patient had a PCN reaction that required hospitalization happened in the hospital Has patient had a PCN reaction occurring within the last 10 years: no If all of the above answers are "NO", then may proceed with Cephalosporin use.      ROS  Constitutional: Negative for fever or weight change.  Respiratory: Negative for cough and shortness of breath.   Cardiovascular: Negative for chest pain or palpitations.  Gastrointestinal: Negative for abdominal pain, no bowel changes.  Musculoskeletal: Negative for gait problem or joint swelling.  Skin: Negative for rash.  Neurological: Negative for dizziness or headache.  No other specific complaints in a complete review of systems (except as listed in HPI above).   Objective  Vitals:   05/09/17 0959  BP: 116/70  Pulse: 86  Resp: 18  Temp: 98.2 F (36.8 C)  TempSrc: Oral  SpO2: 95%  Weight: 217 lb 12.8 oz (98.8 kg)  Height: 5\' 1"  (1.549 m)    Body mass index is 41.15 kg/m.  Physical Exam  Constitutional: Patient appears well-developed and well-nourished. Obese No distress.  HEENT: head atraumatic, normocephalic, pupils equal and reactive to light, neck supple, throat within normal limits Cardiovascular: Normal rate, regular rhythm and normal heart sounds.  No murmur heard. Trace BLE edema. Pulmonary/Chest: Effort normal and breath sounds normal. No respiratory distress. Abdominal: Soft.  There is no  tenderness. Psychiatric: Patient has a normal mood and affect. behavior is normal. Judgment and thought content normal. Muscular Skeletal: no effusion, normal rom of knee, some crepitus with extension of both knees   PHQ2/9: Depression screen [facility] 2/9 05/09/2017 03/28/2017 12/21/2016 06/21/2016 03/22/2016  Decreased Interest 0 1 0 0 0  Down, Depressed, Hopeless 1 1 0 0 0  PHQ - 2 Score 1 2 0 0 0  Altered sleeping 3 1 - - -  Tired, decreased energy 1 1 - - -  Change in appetite 2 1 - - -  Feeling bad or failure about yourself  0 0 - - -  Trouble concentrating 0 0 - - -  Moving slowly or fidgety/restless 0 0 - - -  Suicidal thoughts 0 1 - - -  PHQ-9 Score 7 6 - - -  Difficult doing work/chores Somewhat difficult Somewhat difficult - - -   GAD 7 : Generalized Anxiety Score 03/28/2017  Nervous, Anxious, on Edge 3  Control/stop worrying 1  Worry too much - different things 3  Trouble relaxing 1  Restless 1  Easily annoyed or irritable 1  Afraid - awful might happen 1  Total GAD 7 Score 11  Anxiety Difficulty Somewhat difficult     Fall Risk: Fall Risk  05/09/2017 03/28/2017 12/21/2016 06/21/2016 03/22/2016  Falls in the past year? No No No No No    Assessment & Plan  1. Mild major depression (HCC)  - escitalopram (LEXAPRO) 10 MG tablet; Take 1 tablet (10 mg total) by mouth daily.  Dispense: 90 tablet; Refill: 1  2. Generalized anxiety disorder  - escitalopram (LEXAPRO) 10 MG tablet; Take 1 tablet (10 mg total) by mouth daily.  Dispense: 90 tablet; Refill: 1 - hydrOXYzine (ATARAX/VISTARIL) 10 MG tablet; Take 1 tablet (10 mg total) by mouth daily as needed.  Dispense: 30 tablet; Refill: 0  3. Essential hypertension  Well  controlled today, continue medicatioins  4. Primary osteoarthritis of both knees  Doing well on tramadol qhs and voltaren gel topically

## 2017-05-22 ENCOUNTER — Other Ambulatory Visit: Payer: Self-pay | Admitting: Family Medicine

## 2017-05-22 DIAGNOSIS — F411 Generalized anxiety disorder: Secondary | ICD-10-CM

## 2017-06-05 ENCOUNTER — Other Ambulatory Visit: Payer: Self-pay | Admitting: Family Medicine

## 2017-06-05 DIAGNOSIS — F411 Generalized anxiety disorder: Secondary | ICD-10-CM

## 2017-07-11 ENCOUNTER — Ambulatory Visit: Payer: 59 | Admitting: Family Medicine

## 2017-07-24 ENCOUNTER — Other Ambulatory Visit: Payer: Self-pay | Admitting: Family Medicine

## 2017-07-24 DIAGNOSIS — F411 Generalized anxiety disorder: Secondary | ICD-10-CM

## 2017-07-24 NOTE — Telephone Encounter (Signed)
Refill request was sent to Dr. Krichna Sowles for approval and submission.  

## 2017-08-02 ENCOUNTER — Ambulatory Visit (INDEPENDENT_AMBULATORY_CARE_PROVIDER_SITE_OTHER): Payer: 59 | Admitting: Family Medicine

## 2017-08-02 ENCOUNTER — Other Ambulatory Visit: Payer: Self-pay | Admitting: Family Medicine

## 2017-08-02 ENCOUNTER — Encounter

## 2017-08-02 ENCOUNTER — Encounter: Payer: Self-pay | Admitting: Family Medicine

## 2017-08-02 VITALS — BP 136/82 | HR 92 | Temp 98.0°F | Resp 12 | Ht 61.0 in | Wt 211.1 lb

## 2017-08-02 DIAGNOSIS — M170 Bilateral primary osteoarthritis of knee: Secondary | ICD-10-CM

## 2017-08-02 DIAGNOSIS — Z114 Encounter for screening for human immunodeficiency virus [HIV]: Secondary | ICD-10-CM | POA: Diagnosis not present

## 2017-08-02 DIAGNOSIS — R5383 Other fatigue: Secondary | ICD-10-CM

## 2017-08-02 DIAGNOSIS — F320 Major depressive disorder, single episode, mild: Secondary | ICD-10-CM | POA: Diagnosis not present

## 2017-08-02 DIAGNOSIS — M79672 Pain in left foot: Secondary | ICD-10-CM

## 2017-08-02 DIAGNOSIS — R739 Hyperglycemia, unspecified: Secondary | ICD-10-CM | POA: Diagnosis not present

## 2017-08-02 DIAGNOSIS — F411 Generalized anxiety disorder: Secondary | ICD-10-CM | POA: Diagnosis not present

## 2017-08-02 DIAGNOSIS — E7800 Pure hypercholesterolemia, unspecified: Secondary | ICD-10-CM | POA: Diagnosis not present

## 2017-08-02 DIAGNOSIS — I10 Essential (primary) hypertension: Secondary | ICD-10-CM

## 2017-08-02 MED ORDER — AMLODIPINE BESYLATE 10 MG PO TABS: 10.0000 mg | ORAL_TABLET | Freq: Every day | ORAL | 1 refills | Status: DC

## 2017-08-02 MED ORDER — VALSARTAN-HYDROCHLOROTHIAZIDE 320-25 MG PO TABS: 1.0000 | ORAL_TABLET | Freq: Every day | ORAL | 1 refills | Status: DC

## 2017-08-02 MED ORDER — DICLOFENAC SODIUM 1 % TD GEL: 4.0000 g | Freq: Four times a day (QID) | TRANSDERMAL | 1 refills | Status: DC

## 2017-08-02 MED ORDER — HYDROXYZINE HCL 10 MG PO TABS: 10.0000 mg | ORAL_TABLET | Freq: Every day | ORAL | 0 refills | Status: DC | PRN

## 2017-08-02 NOTE — Progress Notes (Addendum)
Name: Taylor Hurst   MRN: [id number]    DOB: [birth year]   Date:08/02/2017       Progress Note  Subjective  Chief Complaint  Chief Complaint  Patient presents with  . Follow-up    2 months  . Hypertension  . Depression    HPI   HTN: bp is at goal today. She denies chest pain or palpitation   Obesity: she states she developed obesity after second child, went from 167 lbs to 225 lbs during pregnancy. She is not eating bread, eating wraps with high fiber, packing lunch for work, eating health snacks and one V8 can a day with low sodium. She lost 7 lbs since last visit   OA: she is taking Tylenol during the day and tramadol qhs, she has been taking for many years. She had daily aching pain, worse at the end of work day, occasionally still has right knee effusion.  Voltaren gel seems to help also  GAD: she takes alprazolam prn, we changed to Lexapro, also taking Atarax prn. She works in the evenings and has difficulty to unwind.   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 She goes to bed but cannot fall asleep, takes hours to fall asleep but able to stay asleep. She sleeps for about 8 hours. She feels rested when she is awake  Major Depression Mild: Pha 9 is still elevated but she is doing better on Lexapro , states frustrated because husband has problems with his penis ( needs circumcision) and her car is in the shop, otherwise feeling well.   Patient Active Problem List   Diagnosis Date Noted  . Morbid (severe) obesity due to excess calories (HCC) 03/28/2017  . Hyperglycemia 12/22/2015  . Tendonitis, Achilles, right 01/11/2015  . Anxiety disorder 12/07/2014  . Complete rotator cuff tear 11/18/2014  . Hypercholesterolemia 07/30/2014  . Osteoarthritis of both knees 07/30/2014  . Chronic constipation 08/29/2011  . HTN (hypertension) 05/30/2011  . Insomnia  05/30/2011  . Obesity (BMI 30-39.9) 05/30/2011  . GERD (gastroesophageal reflux disease) 05/30/2011    Past Surgical History:  Procedure Laterality Date  . CHOLECYSTECTOMY  07/26/11  . INTRAOPERATIVE CHOLANGIOGRAM  07/26/2011   Procedure: INTRAOPERATIVE CHOLANGIOGRAM;  Surgeon: Ardeth Sportsman, MD;  Location: [facility];  Service: General;  Laterality: N/A;  . LIVER BIOPSY  07/26/2011   Procedure: LIVER BIOPSY;  Surgeon: Ardeth Sportsman, MD;  Location: [facility];  Service: General;  Laterality: N/A;  core liver biopsy   . osteomilitis     right and left leg/surgery; right 1983; left 1988  . osteomylitis  4098,1191   right leg, left leg  . SHOULDER OPEN ROTATOR CUFF REPAIR Right 11/18/2014   Procedure: RIGHT SHOULDER MINI OPEN ROTATOR CUFF REPAIR SUBACROMIAL DECOMPRESSION  ;  Surgeon: Jene Every, MD;  Location: [facility];  Service: Orthopedics;  Laterality: Right;    Family History  Problem Relation Age of Onset  . COPD Mother   . Hypertension Mother   . Hyperlipidemia Mother   . Alcohol abuse Father   . Cancer Maternal Aunt        breast  . Breast cancer Paternal Aunt     Social History   Socioeconomic History  . Marital status: Married    Spouse name: Not on file  . Number of children: Not on file  . Years of education: Not on  file  . Highest education level: Not on file  Occupational History  . Not on file  Social Needs  . Financial resource strain: Not on file  . Food insecurity:    Worry: Not on file    Inability: Not on file  . Transportation needs:    Medical: Not on file    Non-medical: Not on file  Tobacco Use  . Smoking status: Never Smoker  . Smokeless tobacco: Never Used  Substance and Sexual Activity  . Alcohol use: Yes    Alcohol/week: 0.0 oz    Comment: occas  . Drug use: No  . Sexual activity: Yes    Birth control/protection: IUD    Comment: Mirena  Lifestyle  . Physical activity:    Days per week: Not on file    Minutes per session: Not on file  .  Stress: Not on file  Relationships  . Social connections:    Talks on phone: Not on file    Gets together: Not on file    Attends religious service: Not on file    Active member of club or organization: Not on file    Attends meetings of clubs or organizations: Not on file    Relationship status: Not on file  . Intimate partner violence:    Fear of current or ex partner: Not on file    Emotionally abused: Not on file    Physically abused: Not on file    Forced sexual activity: Not on file  Other Topics Concern  . Not on file  Social History Narrative  . Not on file     Current Outpatient Medications:  .  amLODipine (NORVASC) 10 MG tablet, Take 1 tablet (10 mg total) by mouth daily., Disp: 90 tablet, Rfl: 1 .  aspirin EC 81 MG tablet, Take 1 tablet (81 mg total) by mouth daily., Disp: 30 tablet, Rfl: 0 .  b complex vitamins tablet, Take 1 tablet by mouth daily., Disp: , Rfl:  .  BIOTIN PO, Take 10,000 mg by mouth daily., Disp: , Rfl:  .  Black Cohosh 540 MG CAPS, Take 540 mg by mouth daily. , Disp: , Rfl:  .  diclofenac sodium (VOLTAREN) 1 % GEL, Apply 4 g topically 4 (four) times daily., Disp: 100 g, Rfl: 1 .  escitalopram (LEXAPRO) 10 MG tablet, Take 1 tablet (10 mg total) by mouth daily., Disp: 90 tablet, Rfl: 1 .  FIBER PO, Take 5 tablets by mouth 2 (two) times daily., Disp: , Rfl:  .  hydrOXYzine (ATARAX/VISTARIL) 10 MG tablet, TAKE 1 TABLET (10 MG TOTAL) BY MOUTH DAILY AS NEEDED., Disp: 30 tablet, Rfl: 0 .  ketotifen (ZADITOR) 0.025 % ophthalmic solution, 1 drop 2 (two) times daily., Disp: , Rfl:  .  levonorgestrel (MIRENA) 20 MCG/24HR IUD, 1 each by Intrauterine route once. , Disp: , Rfl:  .  Melatonin 5 MG CAPS, Take 10 mg by mouth at bedtime. , Disp: , Rfl:  .  Multiple Vitamin (MULTI-VITAMIN DAILY PO), Take 1 tablet by mouth daily with breakfast. Women's, Disp: , Rfl:  .  Omega-3 Fatty Acids (FISH OIL) 1000 MG CAPS, Take 1,000 mg by mouth 2 (two) times daily., Disp: , Rfl:   .  omeprazole (PRILOSEC) 40 MG capsule, , Disp: , Rfl:  .  traMADol (ULTRAM) 50 MG tablet, Take 1 tablet (50 mg total) by mouth daily as needed., Disp: 30 tablet, Rfl: 2 .  valsartan-hydrochlorothiazide (DIOVAN-HCT) 320-25 MG tablet, Take 1 tablet by  mouth daily., Disp: 90 tablet, Rfl: 1  Allergies  Allergen Reactions  . Percocet [Oxycodone-Acetaminophen] Anaphylaxis    bronchospasms  . Adhesive [Tape] Itching  . Allergy Relief  [Chlorpheniramine Maleate]     Heart flutters  . Betadine  [Povidone Iodine]     blisters blisters  . Other     Betadine=blisters skin  ,Cashew Nuts,sunflower seeds-swelling and itching  . Penicillins Hives    Has patient had a PCN reaction causing immediate rash, facial/tongue/throat swelling, SOB or lightheadedness with hypotension: no Has patient had a PCN reaction causing severe rash involving mucus membranes or skin necrosis: no Has patient had a PCN reaction that required hospitalization happened in the hospital Has patient had a PCN reaction occurring within the last 10 years: no If all of the above answers are "NO", then may proceed with Cephalosporin use.      ROS  Constitutional: Negative for fever,.positive for  weight change - lost since last visit .  Respiratory: Negative for cough and shortness of breath.   Cardiovascular: Negative for chest pain or palpitations.  Gastrointestinal: Negative for abdominal pain, no bowel changes.  Musculoskeletal: Negative for gait problem or joint swelling.  Skin: Negative for rash.  Neurological: Negative for dizziness or headache.  No other specific complaints in a complete review of systems (except as listed in HPI above).  Objective  Vitals:   08/02/17 1156  BP: 136/82  Pulse: 92  Resp: 12  Temp: 98 F (36.7 C)  TempSrc: Oral  SpO2: 93%  Weight: 211 lb 1.6 oz (95.8 kg)  Height: 5\' 1"  (1.549 m)    Body mass index is 39.89 kg/m.  Physical Exam  Constitutional: Patient appears  well-developed and well-nourished. Obese  No distress.  HEENT: head atraumatic, normocephalic, pupils equal and reactive to light,  neck supple, throat within normal limits Cardiovascular: Normal rate, regular rhythm and normal heart sounds. Positive for 2 plus  murmur heard  . No BLE edema. Pulmonary/Chest: Effort normal and breath sounds normal. No respiratory distress. Abdominal: Soft.  There is no tenderness. Psychiatric: Patient has a normal mood and affect. behavior is normal. Judgment and thought content normal.  PHQ2/9: Depression screen [facility] 2/9 08/02/2017 05/09/2017 03/28/2017 12/21/2016 06/21/2016  Decreased Interest 0 0 1 0 0  Down, Depressed, Hopeless 3 1 1  0 0  PHQ - 2 Score 3 1 2  0 0  Altered sleeping 3 3 1  - -  Tired, decreased energy 0 1 1 - -  Change in appetite 0 2 1 - -  Feeling bad or failure about yourself  0 0 0 - -  Trouble concentrating 0 0 0 - -  Moving slowly or fidgety/restless 0 0 0 - -  Suicidal thoughts 0 0 1 - -  PHQ-9 Score 6 7 6  - -  Difficult doing work/chores Not difficult at all Somewhat difficult Somewhat difficult - -    Fall Risk: Fall Risk  08/02/2017 05/09/2017 03/28/2017 12/21/2016 06/21/2016  Falls in the past year? No No No No No     Functional Status Survey: Is the patient deaf or have difficulty hearing?: No Does the patient have difficulty seeing, even when wearing glasses/contacts?: Yes Does the patient have difficulty concentrating, remembering, or making decisions?: No Does the patient have difficulty walking or climbing stairs?: No Does the patient have difficulty dressing or bathing?: No Does the patient have difficulty doing errands alone such as visiting a doctor's office or shopping?: No  Assessment & Plan  1.  Mild major depression (HCC)  Continue medication   2. Screening for HIV (human immunodeficiency virus)  - HIV antibody (with reflex)  3. Generalized anxiety disorder  Doing well   4. Essential hypertension  - CBC  with Differential/Platelet - Comprehensive metabolic panel  5. Primary osteoarthritis of both knees   6. Morbid (severe) obesity due to excess calories (HCC)  Doing well on life style modification   7. Hypercholesterolemia  - Lipid panel  8. Hyperglycemia  - Hemoglobin A1c  9. Other fatigue  - Vitamin B12 - VITAMIN D 25 Hydroxy (Vit-D Deficiency, Fractures) - TSH

## 2017-08-03 LAB — CBC WITH DIFFERENTIAL/PLATELET
BASOS PCT: 0.7 %
Basophils Absolute: 41 cells/uL (ref 0–200)
EOS PCT: 5.6 %
Eosinophils Absolute: 330 cells/uL (ref 15–500)
HCT: 37 % (ref 35.0–45.0)
Hemoglobin: 12.1 g/dL (ref 11.7–15.5)
Lymphs Abs: 1941 cells/uL (ref 850–3900)
MCH: 28.4 pg (ref 27.0–33.0)
MCHC: 32.7 g/dL (ref 32.0–36.0)
MCV: 86.9 fL (ref 80.0–100.0)
MPV: 10.8 fL (ref 7.5–12.5)
Monocytes Relative: 8 %
Neutro Abs: 3115 cells/uL (ref 1500–7800)
Neutrophils Relative %: 52.8 %
PLATELETS: 330 10*3/uL (ref 140–400)
RBC: 4.26 10*6/uL (ref 3.80–5.10)
RDW: 13 % (ref 11.0–15.0)
TOTAL LYMPHOCYTE: 32.9 %
WBC mixed population: 472 cells/uL (ref 200–950)
WBC: 5.9 10*3/uL (ref 3.8–10.8)

## 2017-08-03 LAB — LIPID PANEL
Cholesterol: 213 mg/dL — ABNORMAL HIGH (ref <200)
HDL: 63 mg/dL (ref >50)
LDL Cholesterol (Calc): 128 mg/dL (calc) — ABNORMAL HIGH
NON-HDL CHOLESTEROL (CALC): 150 mg/dL — AB (ref <130)
TRIGLYCERIDES: 110 mg/dL (ref <150)
Total CHOL/HDL Ratio: 3.4 (calc) (ref <5.0)

## 2017-08-03 LAB — COMPREHENSIVE METABOLIC PANEL
AG RATIO: 1.9 (calc) (ref 1.0–2.5)
ALKALINE PHOSPHATASE (APISO): 62 U/L (ref 33–130)
ALT: 19 U/L (ref 6–29)
AST: 20 U/L (ref 10–35)
Albumin: 4.7 g/dL (ref 3.6–5.1)
BUN: 12 mg/dL (ref 7–25)
CHLORIDE: 99 mmol/L (ref 98–110)
CO2: 31 mmol/L (ref 20–32)
Calcium: 9.7 mg/dL (ref 8.6–10.4)
Creat: 0.78 mg/dL (ref 0.50–1.05)
GLOBULIN: 2.5 g/dL (ref 1.9–3.7)
Glucose, Bld: 89 mg/dL (ref 65–99)
Potassium: 3.5 mmol/L (ref 3.5–5.3)
Sodium: 142 mmol/L (ref 135–146)
Total Bilirubin: 0.9 mg/dL (ref 0.2–1.2)
Total Protein: 7.2 g/dL (ref 6.1–8.1)

## 2017-08-03 LAB — HIV ANTIBODY (ROUTINE TESTING W REFLEX): HIV 1&2 Ab, 4th Generation: NONREACTIVE

## 2017-08-03 LAB — HEMOGLOBIN A1C
HEMOGLOBIN A1C: 5.3 %{Hb} (ref <5.7)
Mean Plasma Glucose: 105 (calc)
eAG (mmol/L): 5.8 (calc)

## 2017-08-03 LAB — VITAMIN B12: VITAMIN B 12: 680 pg/mL (ref 200–1100)

## 2017-08-03 LAB — TSH: TSH: 0.79 m[IU]/L

## 2017-08-05 ENCOUNTER — Encounter: Payer: Self-pay | Admitting: Obstetrics and Gynecology

## 2017-09-19 ENCOUNTER — Ambulatory Visit (INDEPENDENT_AMBULATORY_CARE_PROVIDER_SITE_OTHER): Payer: 59 | Admitting: Obstetrics and Gynecology

## 2017-09-19 ENCOUNTER — Encounter: Payer: Self-pay | Admitting: Obstetrics and Gynecology

## 2017-09-19 VITALS — BP 132/83 | HR 76 | Ht 61.0 in | Wt 210.6 lb

## 2017-09-19 DIAGNOSIS — Z23 Encounter for immunization: Secondary | ICD-10-CM | POA: Diagnosis not present

## 2017-09-19 DIAGNOSIS — Z1231 Encounter for screening mammogram for malignant neoplasm of breast: Secondary | ICD-10-CM

## 2017-09-19 DIAGNOSIS — Z01411 Encounter for gynecological examination (general) (routine) with abnormal findings: Secondary | ICD-10-CM

## 2017-09-19 DIAGNOSIS — I10 Essential (primary) hypertension: Secondary | ICD-10-CM | POA: Diagnosis not present

## 2017-09-19 DIAGNOSIS — E669 Obesity, unspecified: Secondary | ICD-10-CM | POA: Diagnosis not present

## 2017-09-19 DIAGNOSIS — Z975 Presence of (intrauterine) contraceptive device: Secondary | ICD-10-CM

## 2017-09-19 DIAGNOSIS — G4762 Sleep related leg cramps: Secondary | ICD-10-CM

## 2017-09-19 DIAGNOSIS — Z01419 Encounter for gynecological examination (general) (routine) without abnormal findings: Secondary | ICD-10-CM

## 2017-09-19 DIAGNOSIS — E785 Hyperlipidemia, unspecified: Secondary | ICD-10-CM

## 2017-09-19 NOTE — Progress Notes (Signed)
GYNECOLOGY CLINIC PROGRESS NOTE  Subjective:    Taylor Hurst is a 52 y.o. married P65 female who presents for an annual exam. The patient has no complaints today.The patient is sexually active.  The patient wears seatbelts: yes. The patient participates in regular exercise: yes (walking several days per week). Has the patient ever been transfused or tattooed?: tattooed. The patient reports that there is not domestic violence in her life.    Menstrual History: Menarche age: 85 No LMP recorded. (Menstrual status: IUD).  Denies h/o abnormal menses or STIs. Last pap: approximate date 10/12/2016 and was abnormal.  Denies h/o abnormal pap smears.  Last mammogram: approximate date 09/2016, normal. (BIRADS 0, right breast, followed by normal diagnostic) Last Colonoscopy: 05/2015, normal. Also had endoscopy which noted small gastric ulcer.  PCP: Alba Cory, MD.  [facility]   Past Medical History:  Diagnosis Date  . Achilles tendinitis 09/23/2014   Right leg   . Arthritis   . Chronic cholecystitis 05/30/2011  . Gastric ulcer   . GERD (gastroesophageal reflux disease)    hx of pt currently has no issues since gallbladder surgery   . Heart murmur   . Hepatitis    2013 chronic active hepatitis during gallbladder issues  . History of blood transfusion    19983  . Hypertension   . Nausea   . Osteomyelitis (HCC)    1983 right leg;1988 left leg  . Torn rotator cuff 09/23/2014   Right side     Past Surgical History:  Procedure Laterality Date  . CHOLECYSTECTOMY  07/26/11  . INTRAOPERATIVE CHOLANGIOGRAM  07/26/2011   Procedure: INTRAOPERATIVE CHOLANGIOGRAM;  Surgeon: Ardeth Sportsman, MD;  Location: [facility];  Service: General;  Laterality: N/A;  . LIVER BIOPSY  07/26/2011   Procedure: LIVER BIOPSY;  Surgeon: Ardeth Sportsman, MD;  Location: [facility];  Service: General;  Laterality: N/A;  core liver biopsy   . osteomilitis     right and left leg/surgery; right 1983; left  1988  . osteomylitis  [id number]   right leg, left leg  . SHOULDER OPEN ROTATOR CUFF REPAIR Right 11/18/2014   Procedure: RIGHT SHOULDER MINI OPEN ROTATOR CUFF REPAIR SUBACROMIAL DECOMPRESSION  ;  Surgeon: Jene Every, MD;  Location: [facility];  Service: Orthopedics;  Laterality: Right;    Family History  Problem Relation Age of Onset  . COPD Mother   . Hypertension Mother   . Hyperlipidemia Mother   . Alcohol abuse Father   . Cancer Maternal Aunt        breast  . Breast cancer Paternal Aunt     Social History   Socioeconomic History  . Marital status: Married    Spouse name: Not on file  . Number of children: Not on file  . Years of education: Not on file  . Highest education level: Not on file  Occupational History  . Not on file  Social Needs  . Financial resource strain: Not on file  . Food insecurity:    Worry: Not on file    Inability: Not on file  . Transportation needs:    Medical: Not on file    Non-medical: Not on file  Tobacco Use  . Smoking status: Never Smoker  . Smokeless tobacco: Never Used  Substance and Sexual Activity  . Alcohol use: Yes    Alcohol/week: 0.0 standard drinks    Comment: occas  . Drug use: No  . Sexual activity: Yes  Birth control/protection: IUD    Comment: Mirena  Lifestyle  . Physical activity:    Days per week: Not on file    Minutes per session: Not on file  . Stress: Not on file  Relationships  . Social connections:    Talks on phone: Not on file    Gets together: Not on file    Attends religious service: Not on file    Active member of club or organization: Not on file    Attends meetings of clubs or organizations: Not on file    Relationship status: Not on file  . Intimate partner violence:    Fear of current or ex partner: Not on file    Emotionally abused: Not on file    Physically abused: Not on file    Forced sexual activity: Not on file  Other Topics Concern  . Not on file  Social History Narrative  . Not  on file    Current Outpatient Medications on File Prior to Visit  Medication Sig Dispense Refill  . amLODipine (NORVASC) 10 MG tablet Take 1 tablet (10 mg total) by mouth daily. 90 tablet 1  . aspirin EC 81 MG tablet Take 1 tablet (81 mg total) by mouth daily. 30 tablet 0  . b complex vitamins tablet Take 1 tablet by mouth daily.    Marland Kitchen BIOTIN PO Take 10,000 mg by mouth daily.    . Black Cohosh 540 MG CAPS Take 540 mg by mouth daily.     . diclofenac sodium (VOLTAREN) 1 % GEL Apply 4 g topically 4 (four) times daily. 300 g 1  . escitalopram (LEXAPRO) 10 MG tablet Take 1 tablet (10 mg total) by mouth daily. 90 tablet 1  . FIBER PO Take 5 tablets by mouth 2 (two) times daily.    . hydrOXYzine (ATARAX/VISTARIL) 10 MG tablet Take 1 tablet (10 mg total) by mouth daily as needed. 90 tablet 0  . ketotifen (ZADITOR) 0.025 % ophthalmic solution 1 drop 2 (two) times daily.    Marland Kitchen levonorgestrel (MIRENA) 20 MCG/24HR IUD 1 each by Intrauterine route once.     . Melatonin 5 MG CAPS Take 10 mg by mouth at bedtime.     . Multiple Vitamin (MULTI-VITAMIN DAILY PO) Take 1 tablet by mouth daily with breakfast. Women's    . Omega-3 Fatty Acids (FISH OIL) 1000 MG CAPS Take 1,000 mg by mouth 2 (two) times daily.    Marland Kitchen omeprazole (PRILOSEC) 40 MG capsule     . traMADol (ULTRAM) 50 MG tablet TAKE 1 TABLET BY MOUTH EVERY DAY AS NEEDED 30 tablet 2  . valsartan-hydrochlorothiazide (DIOVAN-HCT) 320-25 MG tablet Take 1 tablet by mouth daily. 90 tablet 1   No current facility-administered medications on file prior to visit.     Allergies  Allergen Reactions  . Percocet [Oxycodone-Acetaminophen] Anaphylaxis    bronchospasms  . Adhesive [Tape] Itching  . Allergy Relief  [Chlorpheniramine Maleate]     Heart flutters  . Betadine  [Povidone Iodine]     blisters blisters  . Other     Betadine=blisters skin  ,Cashew Nuts,sunflower seeds-swelling and itching  . Penicillins Hives    Has patient had a PCN reaction causing  immediate rash, facial/tongue/throat swelling, SOB or lightheadedness with hypotension: no Has patient had a PCN reaction causing severe rash involving mucus membranes or skin necrosis: no Has patient had a PCN reaction that required hospitalization happened in the hospital Has patient had a PCN reaction occurring within  the last 10 years: no If all of the above answers are "NO", then may proceed with Cephalosporin use.      Review of Systems Constitutional: negative for chills, fatigue, fevers and sweats Eyes: negative for irritation, redness and visual disturbance Ears, nose, mouth, throat, and face: negative for hearing loss, nasal congestion, snoring and tinnitus Respiratory: negative for asthma, cough, sputum Cardiovascular: negative for chest pain, dyspnea, exertional chest pressure/discomfort, irregular heart beat, palpitations and syncope Gastrointestinal: negative for abdominal pain, change in bowel habits, nausea and vomiting Genitourinary: negative for abnormal menstrual periods, genital lesions, sexual problems and vaginal discharge, dysuria and urinary incontinence Integument/breast: negative for breast lump, breast tenderness and nipple discharge Hematologic/lymphatic: negative for bleeding and easy bruising Musculoskeletal:negative for back pain and muscle weakness.  Leg cramps at night.  Neurological: negative for dizziness, headaches, vertigo and weakness Endocrine: negative for diabetic symptoms including polydipsia, polyuria and skin dryness Allergic/Immunologic: negative for hay fever and urticaria    Objective:   Blood pressure 132/83, pulse 76, height 5\' 1"  (1.549 m), weight 210 lb 9.6 oz (95.5 kg). Body mass index is 39.79 kg/m.   General Appearance:    Alert, cooperative, no distress, appears stated age; moderately obese  Head:    Normocephalic, without obvious abnormality, atraumatic  Eyes:    PERRL, conjunctiva/corneas clear, EOM's intact, both eyes  Ears:     Normal external ear canals, both ears  Nose:   Nares normal, septum midline, mucosa normal, no drainage or sinus tenderness  Throat:   Lips, mucosa, and tongue normal; teeth and gums normal  Neck:   Supple, symmetrical, trachea midline, no adenopathy; thyroid: no enlargement/tenderness/nodules; no carotid bruit or JVD  Back:     Symmetric, no curvature, ROM normal, no CVA tenderness  Lungs:     Clear to auscultation bilaterally, respirations unlabored  Chest Wall:    No tenderness or deformity   Heart:    Regular rate and rhythm, S1 and S2 normal, no murmur, rub or gallop  Breast Exam:    No tenderness, masses, or nipple abnormality  Abdomen:     Soft, non-tender, bowel sounds active all four quadrants, no masses, no organomegaly.   Genitalia:    Pelvic:external genitalia normal. Vagina without lesions, discharge, or tenderness; rectovaginal septum  normal. Cervix normal in appearance, no cervical motion tenderness. IUD threads visualized, ~ 1.5 cm in length. Uterus normal size and shape, mobile, nontender.  No adnexal masses or tenderness.   Rectal:    Normal external sphincter.  No hemorrhoids appreciated. Internal exam not done.   Extremities:   Extremities normal, atraumatic, no cyanosis or edema  Pulses:   2+ and symmetric all extremities  Skin:   Skin color, texture, turgor normal, no rashes or lesions  Lymph nodes:   Cervical, supraclavicular, and axillary nodes normal  Neurologic:   CNII-XII intact, normal strength, sensation and reflexes throughout     Labs:   Lab Results  Component Value Date   WBC 5.9 08/02/2017   HGB 12.1 08/02/2017   HCT 37.0 08/02/2017   MCV 86.9 08/02/2017   PLT 330 08/02/2017    Lab Results  Component Value Date   CREATININE 0.78 08/02/2017   BUN 12 08/02/2017   NA 142 08/02/2017   K 3.5 08/02/2017   CL 99 08/02/2017   CO2 31 08/02/2017    Lab Results  Component Value Date   ALT 19 08/02/2017   AST 20 08/02/2017   ALKPHOS 59 06/28/2016  BILITOT 0.9 08/02/2017    Lab Results  Component Value Date   CHOL 213 (H) 08/02/2017   HDL 63 08/02/2017   LDLCALC 128 (H) 08/02/2017   TRIG 110 08/02/2017   CHOLHDL 3.4 08/02/2017     Lab Results  Component Value Date   TSH 0.79 08/02/2017    Assessment:   Routine gynecologic exam.   H/o HTN (controlled).  Obesity (Class II) Leg Cramps Dyslipidemia IUD in place  Plan:     Blood tests: None ordered. Up to date Breast self exam technique reviewed and patient encouraged to perform self-exam monthly. Discussed healthy lifestyle modifications. Pap smear up to date.  Next due in 1 year.  Contraception: Mirena IUD (placed 10/2014).  Screening colonoscopy up to date.  Mammogram due.  Will order.  Desires flu vaccine. Administered today.  Discussion had on symptomatic relief of leg cramps.  Dyslipidemia, currently working on modifying diet and exercising.  To be followed by PCP.  HTN managed by PCP.  Follow up in 1 year for annual exam.    Hildred Laser, MD [facility]

## 2017-09-19 NOTE — Progress Notes (Signed)
PT is present today for her annual exam. Pt stated that she is doing well no complaints.   

## 2017-09-19 NOTE — Patient Instructions (Signed)

## 2017-10-11 ENCOUNTER — Other Ambulatory Visit: Payer: Self-pay | Admitting: Obstetrics and Gynecology

## 2017-10-11 DIAGNOSIS — Z1231 Encounter for screening mammogram for malignant neoplasm of breast: Secondary | ICD-10-CM

## 2017-10-13 ENCOUNTER — Other Ambulatory Visit: Payer: Self-pay | Admitting: Family Medicine

## 2017-10-13 DIAGNOSIS — F411 Generalized anxiety disorder: Secondary | ICD-10-CM

## 2017-10-18 ENCOUNTER — Ambulatory Visit
Admission: RE | Admit: 2017-10-18 | Discharge: 2017-10-18 | Disposition: A | Discharge status: Home / Self Care | Payer: 59 | Source: Ambulatory Visit | Attending: Obstetrics and Gynecology | Admitting: Obstetrics and Gynecology

## 2017-10-18 DIAGNOSIS — Z1231 Encounter for screening mammogram for malignant neoplasm of breast: Secondary | ICD-10-CM

## 2017-10-21 ENCOUNTER — Other Ambulatory Visit: Payer: Self-pay | Admitting: Family Medicine

## 2017-10-21 DIAGNOSIS — F411 Generalized anxiety disorder: Secondary | ICD-10-CM

## 2017-10-21 DIAGNOSIS — F320 Major depressive disorder, single episode, mild: Secondary | ICD-10-CM

## 2017-11-05 ENCOUNTER — Other Ambulatory Visit: Payer: Self-pay | Admitting: Family Medicine

## 2017-11-05 DIAGNOSIS — M170 Bilateral primary osteoarthritis of knee: Secondary | ICD-10-CM

## 2017-11-08 ENCOUNTER — Ambulatory Visit (INDEPENDENT_AMBULATORY_CARE_PROVIDER_SITE_OTHER): Payer: 59 | Admitting: Family Medicine

## 2017-11-08 ENCOUNTER — Encounter: Payer: Self-pay | Admitting: Family Medicine

## 2017-11-08 ENCOUNTER — Ambulatory Visit
Admission: RE | Admit: 2017-11-08 | Discharge: 2017-11-08 | Disposition: A | Discharge status: Home / Self Care | Payer: 59 | Source: Ambulatory Visit | Attending: Family Medicine | Admitting: Family Medicine

## 2017-11-08 VITALS — BP 116/60 | HR 96 | Temp 98.1°F | Resp 16 | Ht 61.0 in | Wt 209.4 lb

## 2017-11-08 DIAGNOSIS — F320 Major depressive disorder, single episode, mild: Secondary | ICD-10-CM | POA: Diagnosis not present

## 2017-11-08 DIAGNOSIS — M898X1 Other specified disorders of bone, shoulder: Secondary | ICD-10-CM | POA: Diagnosis present

## 2017-11-08 DIAGNOSIS — E7800 Pure hypercholesterolemia, unspecified: Secondary | ICD-10-CM

## 2017-11-08 DIAGNOSIS — F411 Generalized anxiety disorder: Secondary | ICD-10-CM

## 2017-11-08 DIAGNOSIS — I10 Essential (primary) hypertension: Secondary | ICD-10-CM

## 2017-11-08 DIAGNOSIS — K219 Gastro-esophageal reflux disease without esophagitis: Secondary | ICD-10-CM

## 2017-11-08 DIAGNOSIS — R739 Hyperglycemia, unspecified: Secondary | ICD-10-CM

## 2017-11-08 DIAGNOSIS — M170 Bilateral primary osteoarthritis of knee: Secondary | ICD-10-CM

## 2017-11-08 MED ORDER — TRAMADOL HCL 50 MG PO TABS: 50.0000 mg | ORAL_TABLET | Freq: Every day | ORAL | 2 refills | Status: DC | PRN

## 2017-11-08 MED ORDER — AMLODIPINE BESYLATE 10 MG PO TABS: 10.0000 mg | ORAL_TABLET | Freq: Every day | ORAL | 1 refills | Status: DC

## 2017-11-08 MED ORDER — ESCITALOPRAM OXALATE 10 MG PO TABS: 10.0000 mg | ORAL_TABLET | Freq: Every day | ORAL | 0 refills | Status: DC

## 2017-11-08 MED ORDER — VALSARTAN-HYDROCHLOROTHIAZIDE 320-25 MG PO TABS: 1.0000 | ORAL_TABLET | Freq: Every day | ORAL | 1 refills | Status: DC

## 2017-11-08 NOTE — Progress Notes (Signed)
Name: Taylor Hurst   MRN: [id number]    DOB: 08/14/1965   Date:11/08/2017       Progress Note  Subjective  Chief Complaint  Chief Complaint  Patient presents with  . Medication Refill    3 month F/U  . Depression  . Hypertension    Denies any symptoms  . GAD  . Osteoarthritis  . Obesity  . Ankle Pain    Onset-couple of weeks ago, Right Ankle will feel like someone is "burning her with a cigarette lighter" goes on constantly. Also has cramping in her feet every night.  . Cyst    Onset- couple of weeks, there is a small spot on her right shoulder area that feels like a cyst. Would like to have it checked out.    HPI  HTN: bpis at goal today. She denies dizziness, chest pain or palpitation  Obesity: she states she developed obesity after second child, went from 167 lbs to 225 lbs during pregnancy. She is not eating bread, eating wraps with high fiber, packing lunch for work, eating health snacks and one V8 can a day with low sodium. She lost another 2 lbs since last visit   OA: she is taking Tylenolduring the day and tramadol qhs,she has been taking for many years. She had daily aching pain, worse at the end of work day, occasionally still has intermittent  right knee effusion.  Voltaren gel also helps with symptoms.   GAD: she takes alprazolam prn, wechanged to Lexapro, also taking Atarax prn. She works in the evenings and has difficulty unwinding at the end of the day.   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 She goes to bed but cannot fall asleep, takes hours to fall asleep but able to stay asleep. She sleeps for about 8 hours. She feels rested when she is awake. Unchanged   Major Depression Mild: Pha 9 is still elevated but she is doing better on Lexapro , states frustrated because husband has problems with his penis ( needs circumcision), no  intercourse since July 2019. She states her husband is struggling because his brother died recently.   Right lateral ankle pain: she has a history of achilles tendinitis, had PT about one year ago and was doing well, but over the past month she has noticed a burning, sharp sensation on right lateral ankle a couple times a week, lasts a couple of seconds and resolves by itself. No edema, no trauma, no other problems with her foot.   Clavicle prominence: on the right  side, she states she noticed not very symmetrical and tender to touch ( when she has to move her bra over ) , no redness or increase in warmth  Patient Active Problem List   Diagnosis Date Noted  . Morbid (severe) obesity due to excess calories (HCC) 03/28/2017  . Hyperglycemia 12/22/2015  . Tendonitis, Achilles, right 01/11/2015  . Anxiety disorder 12/07/2014  . Complete rotator cuff tear 11/18/2014  . Hypercholesterolemia 07/30/2014  . Osteoarthritis of both knees 07/30/2014  . Chronic constipation 08/29/2011  . HTN (hypertension) 05/30/2011  . Insomnia 05/30/2011  . Obesity (BMI 30-39.9) 05/30/2011  . GERD (gastroesophageal reflux disease) 05/30/2011    Past Surgical History:  Procedure Laterality Date  . CHOLECYSTECTOMY  07/26/11  . INTRAOPERATIVE CHOLANGIOGRAM  07/26/2011   Procedure: INTRAOPERATIVE CHOLANGIOGRAM;  Surgeon: Ardeth Sportsman, MD;  Location: [facility];  Service: General;  Laterality: N/A;  . LIVER BIOPSY  07/26/2011   Procedure: LIVER BIOPSY;  Surgeon: Ardeth Sportsman, MD;  Location: [facility];  Service: General;  Laterality: N/A;  core liver biopsy   . osteomilitis     right and left leg/surgery; right 1983; left 1988  . osteomylitis  [id number]   right leg, left leg  . SHOULDER OPEN ROTATOR CUFF REPAIR Right 11/18/2014   Procedure: RIGHT SHOULDER MINI OPEN ROTATOR CUFF REPAIR SUBACROMIAL DECOMPRESSION  ;  Surgeon: Jene Every, MD;  Location: [facility];  Service: Orthopedics;  Laterality: Right;    Family  History  Problem Relation Age of Onset  . COPD Mother   . Hypertension Mother   . Hyperlipidemia Mother   . Alcohol abuse Father   . Cancer Maternal Aunt        breast  . Breast cancer Paternal Aunt     Social History   Socioeconomic History  . Marital status: Married    Spouse name: Not on file  . Number of children: Not on file  . Years of education: Not on file  . Highest education level: Not on file  Occupational History  . Not on file  Social Needs  . Financial resource strain: Not hard at all  . Food insecurity:    Worry: Never true    Inability: Never true  . Transportation needs:    Medical: No    Non-medical: No  Tobacco Use  . Smoking status: Never Smoker  . Smokeless tobacco: Never Used  Substance and Sexual Activity  . Alcohol use: Yes    Alcohol/week: 0.0 standard drinks    Comment: occassionally  . Drug use: No  . Sexual activity: Yes    Birth control/protection: IUD    Comment: Mirena  Lifestyle  . Physical activity:    Days per week: 0 days    Minutes per session: 0 min  . Stress: Not at all  Relationships  . Social connections:    Talks on phone: Three times a week    Gets together: Three times a week    Attends religious service: Never    Active member of club or organization: No    Attends meetings of clubs or organizations: Never    Relationship status: Married  . Intimate partner violence:    Fear of current or ex partner: No    Emotionally abused: No    Physically abused: No    Forced sexual activity: No  Other Topics Concern  . Not on file  Social History Narrative  . Not on file     Current Outpatient Medications:  .  amLODipine (NORVASC) 10 MG tablet, Take 1 tablet (10 mg total) by mouth daily., Disp: 90 tablet, Rfl: 1 .  aspirin EC 81 MG tablet, Take 1 tablet (81 mg total) by mouth daily., Disp: 30 tablet, Rfl: 0 .  b complex vitamins tablet, Take 1 tablet by mouth daily., Disp: , Rfl:  .  BIOTIN PO, Take 10,000 mg by  mouth daily., Disp: , Rfl:  .  Black Cohosh 540 MG CAPS, Take 540 mg by mouth daily. , Disp: , Rfl:  .  diclofenac sodium (VOLTAREN) 1 % GEL, Apply 4 g topically 4 (four) times daily., Disp: 300 g, Rfl: 1 .  escitalopram (LEXAPRO) 10 MG tablet, TAKE 1 TABLET DAILY, Disp: 90 tablet, Rfl: 0 .  FIBER PO, Take 5 tablets by mouth 2 (two) times daily., Disp: ,  Rfl:  .  hydrOXYzine (ATARAX/VISTARIL) 10 MG tablet, TAKE 1 TABLET DAILY AS NEEDED, Disp: 90 tablet, Rfl: 0 .  ketotifen (ZADITOR) 0.025 % ophthalmic solution, 1 drop 2 (two) times daily., Disp: , Rfl:  .  levonorgestrel (MIRENA) 20 MCG/24HR IUD, 1 each by Intrauterine route once. , Disp: , Rfl:  .  Melatonin 5 MG CAPS, Take 10 mg by mouth at bedtime. , Disp: , Rfl:  .  Multiple Vitamin (MULTI-VITAMIN DAILY PO), Take 1 tablet by mouth daily with breakfast. Women's, Disp: , Rfl:  .  Omega-3 Fatty Acids (FISH OIL) 1000 MG CAPS, Take 1,000 mg by mouth 2 (two) times daily., Disp: , Rfl:  .  omeprazole (PRILOSEC) 40 MG capsule, , Disp: , Rfl:  .  traMADol (ULTRAM) 50 MG tablet, TAKE 1 TABLET BY MOUTH EVERY DAY AS NEEDED, Disp: 30 tablet, Rfl: 2 .  valsartan-hydrochlorothiazide (DIOVAN-HCT) 320-25 MG tablet, Take 1 tablet by mouth daily., Disp: 90 tablet, Rfl: 1  Allergies  Allergen Reactions  . Percocet [Oxycodone-Acetaminophen] Anaphylaxis    bronchospasms  . Adhesive [Tape] Itching  . Allergy Relief  [Chlorpheniramine Maleate]     Heart flutters  . Betadine  [Povidone Iodine]     blisters blisters  . Other     Betadine=blisters skin  ,Cashew Nuts,sunflower seeds-swelling and itching  . Penicillins Hives    Has patient had a PCN reaction causing immediate rash, facial/tongue/throat swelling, SOB or lightheadedness with hypotension: no Has patient had a PCN reaction causing severe rash involving mucus membranes or skin necrosis: no Has patient had a PCN reaction that required hospitalization happened in the hospital Has patient had a PCN  reaction occurring within the last 10 years: no If all of the above answers are "NO", then may proceed with Cephalosporin use.     I personally reviewed active problem list, medication list, allergies, family history, social history with the patient/caregiver today.   ROS  Constitutional: Negative for fever or weight change.  Respiratory: Negative for cough and shortness of breath.   Cardiovascular: Negative for chest pain or palpitations.  Gastrointestinal: Negative for abdominal pain, no bowel changes.  Musculoskeletal: Negative for gait problem or joint swelling.  Skin: Negative for rash.  Neurological: Negative for dizziness or headache.  No other specific complaints in a complete review of systems (except as listed in HPI above).   Objective  Vitals:   11/08/17 1139  BP: 116/60  Pulse: 96  Resp: 16  Temp: 98.1 F (36.7 C)  TempSrc: Oral  SpO2: 97%  Weight: 209 lb 6.4 oz (95 kg)  Height: 5\' 1"  (1.549 m)    Body mass index is 39.57 kg/m.  Physical Exam  Constitutional: Patient appears well-developed and well-nourished. Obese No distress.  HEENT: head atraumatic, normocephalic, pupils equal and reactive to light, neck supple, throat within normal limits Cardiovascular: Normal rate, regular rhythm and normal heart sounds.  No murmur heard. No BLE edema. Pulmonary/Chest: Effort normal and breath sounds normal. No respiratory distress. Abdominal: Soft.  There is no tenderness. Muscular skeletal: mild prominent sterno-clavicle joint, no redness, mild tenderness  Psychiatric: Patient has a normal mood and affect. behavior is normal. Judgment and thought content normal.  PHQ2/9: Depression screen [facility] 2/9 08/02/2017 05/09/2017 03/28/2017 12/21/2016 06/21/2016  Decreased Interest 0 0 1 0 0  Down, Depressed, Hopeless 3 1 1  0 0  PHQ - 2 Score 3 1 2  0 0  Altered sleeping 3 3 1  - -  Tired, decreased energy 0 1  1 - -  Change in appetite 0 2 1 - -  Feeling bad or failure about  yourself  0 0 0 - -  Trouble concentrating 0 0 0 - -  Moving slowly or fidgety/restless 0 0 0 - -  Suicidal thoughts 0 0 1 - -  PHQ-9 Score 6 7 6  - -  Difficult doing work/chores Not difficult at all Somewhat difficult Somewhat difficult - -     Fall Risk: Fall Risk  11/08/2017 08/02/2017 05/09/2017 03/28/2017 12/21/2016  Falls in the past year? No No No No No     Functional Status Survey: Is the patient deaf or have difficulty hearing?: No Does the patient have difficulty seeing, even when wearing glasses/contacts?: Yes(reading glasses) Does the patient have difficulty concentrating, remembering, or making decisions?: No Does the patient have difficulty walking or climbing stairs?: No Does the patient have difficulty dressing or bathing?: No Does the patient have difficulty doing errands alone such as visiting a doctor's office or shopping?: No    Assessment & Plan   1. Mild major depression (HCC)  - escitalopram (LEXAPRO) 10 MG tablet; Take 1 tablet (10 mg total) by mouth daily.  Dispense: 90 tablet; Refill: 0  2. Generalized anxiety disorder  - escitalopram (LEXAPRO) 10 MG tablet; Take 1 tablet (10 mg total) by mouth daily.  Dispense: 90 tablet; Refill: 0  3. Essential hypertension  - amLODipine (NORVASC) 10 MG tablet; Take 1 tablet (10 mg total) by mouth daily.  Dispense: 90 tablet; Refill: 1 - valsartan-hydrochlorothiazide (DIOVAN-HCT) 320-25 MG tablet; Take 1 tablet by mouth daily.  Dispense: 90 tablet; Refill: 1  4. Primary osteoarthritis of both knees  - traMADol (ULTRAM) 50 MG tablet; Take 1 tablet (50 mg total) by mouth daily as needed.  Dispense: 30 tablet; Refill: 2  5. Morbid (severe) obesity due to excess calories (HCC)  BMI above 35 with co-morbidities, continue life style modification   6. Hypercholesterolemia   7. Hyperglycemia   8. Gastroesophageal reflux disease without esophagitis   9. Pain of right clavicle  - DG Clavicle Right; Future

## 2017-11-08 NOTE — Patient Instructions (Signed)
Tarsal Tunnel Syndrome Tarsal tunnel syndrome is a condition that happens when a nerve (tibial nerve) is irritated or squeezed (compressed) as it passes through an area on the inside of your ankle (tarsal tunnel). The tarsal tunnel is a narrow passage through the connective tissue and bones in your feet (tarsals). The tibial nerve passes behind the large bony bump at your inner ankle (medial malleolus) and sends branches to your foot and toes. This nerve helps supply feeling to your heel, to the bottom of your foot, and to some of your toe muscles. Tarsal tunnel syndrome usually causes ankle and foot pain that gets worse with activity. What are the causes? Tarsal tunnel syndrome can be caused by any condition that narrows the space in the tarsal tunnel. Athletes may get tarsal tunnel syndrome from a fractured ankle or from an outward (eversion) ankle sprain that results in scarring or swelling. Other common causes include:  Over-pronation. This is when your feet roll in and flatten too much when you stand, walk, or run.  Extra pressure on the tarsal tunnel area from tight or stiff shoes or boots.  Decreased room in the tarsal tunnel due to small, fluid-filled sacs (cysts) or growths on the bones near the tunnel (exostosis).  What increases the risk? This condition is more likely to develop in people who:  Play sports where they wear high, stiff boots, such as downhill skiing.  Play sports with repetitive motion, such as running.  Play sports on uneven surfaces that can lead to a sprained ankle, such as soccer or football.  Have had an inner ankle injury.  Have flat feet.  Have a condition such as diabetes, hypothyroidism, or rheumatoid arthritis.  What are the signs or symptoms? Symptoms of this condition include:  Burning pain behind the ankle, in the heel, or in the foot that gets worse if you are standing, walking, or running.  Numbness or a tingling sensation (pins and needles) in  your heel, foot, or toes.  At first, your symptoms may get worse with activity and be relieved with rest. Over time, your symptoms may become constant or come on sooner with less activity. How is this diagnosed? This condition is diagnosed based on your symptoms, medical history, and physical exam. During the exam, your health care provider may tap on the area below your ankle to check for tingling in your foot or toes. Your health care provider may also inject numbing medicine into the tarsal tunnel to see if it relieves your pain. You may also have other tests, including:  X-rays to check bone structure.  MRI or ultrasound to examine nerve and tendon structures and find where your nerve is getting compressed.  An electrical study of nerve function (electromyography, or EMG).  How is this treated? Treatment may include:  Wearing a removable splint or boot for ankle support.  Using a shoe insert (orthotic) to help support your arch.  Using ice to reduce swelling.  Taking pain medicine.  Having medicine injected into your ankle joint to reduce pain and swelling.  Starting range-of-motion exercises and strengthening exercises.  Gradually returning to full activity. The timing will depend on the severity of your condition and your response to treatment.  You may need surgery if there is a soft tissue growth or a bone growth, or if other treatments have not helped. After surgery, you may need to wear a removable splint or boot for support. You also may need physical therapy. Follow these instructions at   home: If you have a splint or boot:  Wear the splint or boot as told by your health care provider. Remove it only as told by your health care provider.  Loosen the splint or boot if your toes tingle, become numb, or turn cold and blue.  If your splint or boot is not waterproof: ? Do not let it get wet. ? Cover it with a watertight covering when you take a bath or shower.  Keep the  splint or boot clean. Managing pain, stiffness, and swelling  If directed, put ice on the injured area. ? Put ice in a plastic bag. ? Place a towel between your skin and the bag. ? Leave the ice on for 20 minutes, 2-3 times a day.  Move your toes often to avoid stiffness and to lessen swelling.  Raise (elevate) your foot above the level of your heart while you are sitting or lying down. Driving  Ask your health care provider when it is safe to drive if you have a splint or boot on a foot that you use for driving.  Do not drive or operate heavy machinery while taking prescription pain medicine. Activity  Return to your normal activities as told by your health care provider. Ask your health care provider what activities are safe for you.  Do exercises as told by your health care provider. General instructions  Do not use any tobacco products, such as cigarettes, chewing tobacco, and e-cigarettes. Tobacco can delay healing. If you need help quitting, ask your health care provider.  Take over-the-counter and prescription medicines only as told by your health care provider.  Keep all follow-up visits as told by your health care provider. This is important. How is this prevented?  Give your body time to rest between periods of activity.  Make sure to wear supportive and comfortable shoes during athletic activity.  Do not over-tighten ski boots or the laces on high-top shoes.  Be safe and responsible while being active to avoid falls. Contact a health care provider if:  Your ankle pain is not getting better.  You are unable to support (bear) body weight on your foot without pain. This information is not intended to replace advice given to you by your health care provider. Make sure you discuss any questions you have with your health care provider. Document Released: 01/01/2005 Document Revised: 09/07/2015 Document Reviewed: 12/14/2014 Elsevier Interactive Patient Education  2018  [employer]  

## 2018-01-11 ENCOUNTER — Other Ambulatory Visit: Payer: Self-pay | Admitting: Family Medicine

## 2018-01-11 DIAGNOSIS — F411 Generalized anxiety disorder: Secondary | ICD-10-CM

## 2018-02-14 ENCOUNTER — Telehealth: Payer: Self-pay | Admitting: Family Medicine

## 2018-02-14 ENCOUNTER — Encounter: Payer: Self-pay | Admitting: Family Medicine

## 2018-02-14 ENCOUNTER — Ambulatory Visit (INDEPENDENT_AMBULATORY_CARE_PROVIDER_SITE_OTHER): Payer: 59 | Admitting: Family Medicine

## 2018-02-14 VITALS — BP 120/80 | HR 91 | Temp 97.9°F | Resp 16 | Ht 61.0 in | Wt 214.2 lb

## 2018-02-14 DIAGNOSIS — M170 Bilateral primary osteoarthritis of knee: Secondary | ICD-10-CM | POA: Diagnosis not present

## 2018-02-14 DIAGNOSIS — I10 Essential (primary) hypertension: Secondary | ICD-10-CM

## 2018-02-14 DIAGNOSIS — R739 Hyperglycemia, unspecified: Secondary | ICD-10-CM | POA: Diagnosis not present

## 2018-02-14 DIAGNOSIS — F411 Generalized anxiety disorder: Secondary | ICD-10-CM | POA: Diagnosis not present

## 2018-02-14 DIAGNOSIS — F320 Major depressive disorder, single episode, mild: Secondary | ICD-10-CM

## 2018-02-14 MED ORDER — HYDROXYZINE HCL 10 MG PO TABS: 10.0000 mg | ORAL_TABLET | Freq: Every day | ORAL | 0 refills | Status: DC | PRN

## 2018-02-14 MED ORDER — DICLOFENAC SODIUM 1 % TD GEL: 4.0000 g | Freq: Four times a day (QID) | TRANSDERMAL | 1 refills | Status: DC

## 2018-02-14 MED ORDER — TRAMADOL HCL 50 MG PO TABS: 50.0000 mg | ORAL_TABLET | Freq: Every day | ORAL | 2 refills | Status: DC | PRN

## 2018-02-14 MED ORDER — ESCITALOPRAM OXALATE 10 MG PO TABS: 10.0000 mg | ORAL_TABLET | Freq: Every day | ORAL | 0 refills | Status: DC

## 2018-02-14 MED ORDER — SEMAGLUTIDE(0.25 OR 0.5MG/DOS) 2 MG/1.5ML ~~LOC~~ SOPN: 0.5000 mg | PEN_INJECTOR | SUBCUTANEOUS | 0 refills | Status: DC

## 2018-02-14 NOTE — Telephone Encounter (Signed)
Copied from CRM [id number]. Topic: Quick Communication - Rx Refill/Question >> Feb 14, 2018  1:41 PM Fanny Bien wrote: Medication: pt called and stated that she should of got a RX for Ozdmpic and did not receive. Please advise

## 2018-02-14 NOTE — Patient Instructions (Signed)
Check with Insurance about coverage for weight loss medication:  Saxenda, Belviq, Qysmia, Contrave and Ozempic.
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
## 2018-03-15 ENCOUNTER — Other Ambulatory Visit: Payer: Self-pay | Admitting: Family Medicine

## 2018-03-15 DIAGNOSIS — R739 Hyperglycemia, unspecified: Secondary | ICD-10-CM

## 2018-03-28 ENCOUNTER — Encounter: Payer: Self-pay | Admitting: Family Medicine

## 2018-04-11 ENCOUNTER — Other Ambulatory Visit: Payer: Self-pay

## 2018-04-11 DIAGNOSIS — R739 Hyperglycemia, unspecified: Secondary | ICD-10-CM

## 2018-04-11 MED ORDER — SEMAGLUTIDE(0.25 OR 0.5MG/DOS) 2 MG/1.5ML ~~LOC~~ SOPN: 0.5000 mg | PEN_INJECTOR | SUBCUTANEOUS | 1 refills | Status: DC

## 2018-04-11 NOTE — Telephone Encounter (Signed)
Refill request for general medication: Ozempic 0.25 or 0.5 mg  Last office visit: 02/14/2018  Follow-ups on file. 05/16/2018

## 2018-04-13 ENCOUNTER — Encounter: Payer: Self-pay | Admitting: Family Medicine

## 2018-04-13 ENCOUNTER — Other Ambulatory Visit: Payer: Self-pay | Admitting: Family Medicine

## 2018-04-13 DIAGNOSIS — F411 Generalized anxiety disorder: Secondary | ICD-10-CM

## 2018-04-14 ENCOUNTER — Encounter: Payer: Self-pay | Admitting: Family Medicine

## 2018-04-14 ENCOUNTER — Other Ambulatory Visit: Payer: Self-pay | Admitting: Family Medicine

## 2018-04-14 DIAGNOSIS — R739 Hyperglycemia, unspecified: Secondary | ICD-10-CM

## 2018-04-14 MED ORDER — SEMAGLUTIDE(0.25 OR 0.5MG/DOS) 2 MG/1.5ML ~~LOC~~ SOPN: 0.5000 mg | PEN_INJECTOR | SUBCUTANEOUS | 0 refills | Status: DC

## 2018-05-07 ENCOUNTER — Other Ambulatory Visit: Payer: Self-pay | Admitting: Family Medicine

## 2018-05-07 DIAGNOSIS — I10 Essential (primary) hypertension: Secondary | ICD-10-CM

## 2018-05-07 NOTE — Telephone Encounter (Signed)
No it will run out shortly prior to her appt in May

## 2018-05-12 ENCOUNTER — Encounter: Payer: Self-pay | Admitting: Family Medicine

## 2018-05-16 ENCOUNTER — Other Ambulatory Visit: Payer: Self-pay

## 2018-05-16 ENCOUNTER — Encounter: Payer: Self-pay | Admitting: Family Medicine

## 2018-05-16 ENCOUNTER — Ambulatory Visit (INDEPENDENT_AMBULATORY_CARE_PROVIDER_SITE_OTHER): Payer: 59 | Admitting: Family Medicine

## 2018-05-16 VITALS — BP 121/74 | HR 72 | Wt 204.4 lb

## 2018-05-16 DIAGNOSIS — F325 Major depressive disorder, single episode, in full remission: Secondary | ICD-10-CM | POA: Diagnosis not present

## 2018-05-16 DIAGNOSIS — M25512 Pain in left shoulder: Secondary | ICD-10-CM

## 2018-05-16 DIAGNOSIS — M170 Bilateral primary osteoarthritis of knee: Secondary | ICD-10-CM | POA: Diagnosis not present

## 2018-05-16 DIAGNOSIS — R739 Hyperglycemia, unspecified: Secondary | ICD-10-CM | POA: Diagnosis not present

## 2018-05-16 DIAGNOSIS — M898X1 Other specified disorders of bone, shoulder: Secondary | ICD-10-CM

## 2018-05-16 DIAGNOSIS — F411 Generalized anxiety disorder: Secondary | ICD-10-CM

## 2018-05-16 DIAGNOSIS — E7800 Pure hypercholesterolemia, unspecified: Secondary | ICD-10-CM

## 2018-05-16 DIAGNOSIS — I10 Essential (primary) hypertension: Secondary | ICD-10-CM

## 2018-05-16 MED ORDER — SEMAGLUTIDE(0.25 OR 0.5MG/DOS) 2 MG/1.5ML ~~LOC~~ SOPN: 0.5000 mg | PEN_INJECTOR | SUBCUTANEOUS | 2 refills | Status: DC

## 2018-05-16 MED ORDER — ESCITALOPRAM OXALATE 10 MG PO TABS: 10.0000 mg | ORAL_TABLET | Freq: Every day | ORAL | 0 refills | Status: DC

## 2018-05-16 MED ORDER — AMLODIPINE BESYLATE 10 MG PO TABS: 10.0000 mg | ORAL_TABLET | Freq: Every day | ORAL | 0 refills | Status: DC

## 2018-05-16 MED ORDER — HYDROXYZINE HCL 10 MG PO TABS: 10.0000 mg | ORAL_TABLET | Freq: Every day | ORAL | 0 refills | Status: DC | PRN

## 2018-05-16 MED ORDER — LORATADINE 10 MG PO TABS: 10.0000 mg | ORAL_TABLET | Freq: Every day | ORAL | 0 refills | Status: DC

## 2018-05-16 MED ORDER — TRAMADOL HCL 50 MG PO TABS: 50.0000 mg | ORAL_TABLET | Freq: Every day | ORAL | 2 refills | Status: DC | PRN

## 2018-05-16 MED ORDER — VALSARTAN-HYDROCHLOROTHIAZIDE 320-25 MG PO TABS: 1.0000 | ORAL_TABLET | Freq: Every day | ORAL | 0 refills | Status: DC

## 2018-05-16 NOTE — Progress Notes (Signed)
Name: Taylor Hurst   MRN: [id number]    DOB: [birth year]   Date:05/16/2018       Progress Note  Subjective  Chief Complaint  Chief Complaint  Patient presents with  . Hyperglycemia  . Depression  . Hyperlipidemia  . Obesity    Still using the Ozemepic and has lost 10 lbs.  . Arm Pain    Left     I connected with  Sitlaly F Cannedy  on 05/16/18 at  8:20 AM EDT by a video enabled telemedicine application and verified that I am speaking with the correct person using two identifiers.  I discussed the limitations of evaluation and management by telemedicine and the availability of in person appointments. The patient expressed understanding and agreed to proceed. Staff also discussed with the patient that there may be a patient responsible charge related to this service. Patient Location: at home  Provider Location: [facility]   HPI  HTN: bpis at goal today. She deniesdizziness,chest pain, headaches  or palpitation. Compliant with medication   Obesity: she states she developed obesity after second child, went from 167 lbs to 225 lbs during pregnancy. Since Fall of 2019 she changed her diet, she was not eating bread, she was  eating wraps with high fiber, packing lunch for work, eating health snacks and one V8 can a day with low sodium. She is now on Ozempic and tolerating it well, curbing her appetite and she has lost 10 lbs since last visit   OA: she is taking Tylenolduring the day and tramadol qhs,she has been taking for many years. She had daily aching pain, worse at the end of work day,most of her pain is on her right knee, no recent effusion or erythema   GAD: she is off alprazolam , under control with  Lexapro, also taking Atarax prn. 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 She falls asleep around 3 am and wakes  up around 11 am. Wakes feeling well.   Major Depression Mild: Pha 9is still elevated but she is doing better onLexapro, she states husbands penis problems resolved and that has improved.   Shoulder pain: she has large breast, she states clavicle x-ray negative, she changed the way her bra strap is on her shoulder , still having daily pain, but mild now, mostly on left shoulder now. Sometimes sharp and stabbing.  She is thinking about breast reduction surgery   Patient Active Problem List   Diagnosis Date Noted  . Morbid (severe) obesity due to excess calories (HCC) 03/28/2017  . Hyperglycemia 12/22/2015  . Tendonitis, Achilles, right 01/11/2015  . Anxiety disorder 12/07/2014  . Complete rotator cuff tear 11/18/2014  . Hypercholesterolemia 07/30/2014  . Osteoarthritis of both knees 07/30/2014  . Chronic constipation 08/29/2011  . HTN (hypertension) 05/30/2011  . Insomnia 05/30/2011  . Obesity (BMI 30-39.9) 05/30/2011  . GERD (gastroesophageal reflux disease) 05/30/2011    Past Surgical History:  Procedure Laterality Date  . CHOLECYSTECTOMY  07/26/11  . INTRAOPERATIVE CHOLANGIOGRAM  07/26/2011   Procedure: INTRAOPERATIVE CHOLANGIOGRAM;  Surgeon: Ardeth Sportsman, MD;  Location: [facility];  Service: General;  Laterality: N/A;  . LIVER BIOPSY  07/26/2011   Procedure: LIVER BIOPSY;  Surgeon: Ardeth Sportsman, MD;  Location: [facility];  Service: General;  Laterality: N/A;  core liver biopsy   . osteomilitis     right and left leg/surgery; right 1983; left 1988  . osteomylitis  [id number]   right leg, left leg  . SHOULDER OPEN ROTATOR CUFF REPAIR Right 11/18/2014   Procedure: RIGHT SHOULDER MINI OPEN ROTATOR CUFF REPAIR SUBACROMIAL DECOMPRESSION  ;  Surgeon: Jene Every, MD;  Location: [facility];  Service: Orthopedics;  Laterality: Right;    Family History  Problem Relation Age of Onset  . COPD Mother   .  Hypertension Mother   . Hyperlipidemia Mother   . Alcohol abuse Father   . Cancer Maternal Aunt        breast  . Breast cancer Paternal Aunt     Social History   Socioeconomic History  . Marital status: Married    Spouse name: Not on file  . Number of children: Not on file  . Years of education: Not on file  . Highest education level: Not on file  Occupational History  . Not on file  Social Needs  . Financial resource strain: Not hard at all  . Food insecurity:    Worry: Never true    Inability: Never true  . Transportation needs:    Medical: No    Non-medical: No  Tobacco Use  . Smoking status: Never Smoker  . Smokeless tobacco: Never Used  Substance and Sexual Activity  . Alcohol use: Yes    Alcohol/week: 0.0 standard drinks    Comment: occassionally  . Drug use: No  . Sexual activity: Yes    Birth control/protection: I.U.D.    Comment: Mirena  Lifestyle  . Physical activity:    Days per week: 0 days    Minutes per session: 0 min  . Stress: Not at all  Relationships  . Social connections:    Talks on phone: Three times a week    Gets together: Three times a week    Attends religious service: Never    Active member of club or organization: No    Attends meetings of clubs or organizations: Never    Relationship status: Married  . Intimate partner violence:    Fear of current or ex partner: No    Emotionally abused: No    Physically abused: No    Forced sexual activity: No  Other Topics Concern  . Not on file  Social History Narrative  . Not on file     Current Outpatient Medications:  .  aspirin EC 81 MG tablet, Take 1 tablet (81 mg total) by mouth daily., Disp: 30 tablet, Rfl: 0 .  b complex vitamins tablet, Take 1 tablet by mouth daily., Disp: , Rfl:  .  BIOTIN PO, Take 10,000 mg by mouth daily., Disp: , Rfl:  .  Black Cohosh 540 MG CAPS, Take 540 mg by mouth daily. , Disp: , Rfl:  .  diclofenac sodium (VOLTAREN) 1 % GEL, Apply 4 g topically 4  (four) times daily., Disp: 300 g, Rfl: 1 .  FIBER PO, Take 5 tablets by mouth 2 (two) times daily., Disp: , Rfl:  .  ketotifen (ZADITOR) 0.025 % ophthalmic solution, 1 drop 2 (two) times daily., Disp: , Rfl:  .  levonorgestrel (MIRENA) 20 MCG/24HR IUD, 1 each by Intrauterine route once. , Disp: , Rfl:  .  Melatonin 5 MG CAPS, Take 10 mg by mouth at bedtime. , Disp: , Rfl:  .  Multiple Vitamin (MULTI-VITAMIN DAILY PO),  Take 1 tablet by mouth daily with breakfast. Women's, Disp: , Rfl:  .  Omega-3 Fatty Acids (FISH OIL) 1000 MG CAPS, Take 1,000 mg by mouth 2 (two) times daily., Disp: , Rfl:  .  omeprazole (PRILOSEC) 40 MG capsule, , Disp: , Rfl:  .  amLODipine (NORVASC) 10 MG tablet, Take 1 tablet (10 mg total) by mouth daily., Disp: 90 tablet, Rfl: 0 .  escitalopram (LEXAPRO) 10 MG tablet, Take 1 tablet (10 mg total) by mouth daily., Disp: 90 tablet, Rfl: 0 .  hydrOXYzine (ATARAX/VISTARIL) 10 MG tablet, Take 1 tablet (10 mg total) by mouth daily as needed., Disp: 90 tablet, Rfl: 0 .  loratadine (CLARITIN) 10 MG tablet, Take 1 tablet (10 mg total) by mouth daily., Disp: 100 tablet, Rfl: 0 .  Semaglutide,0.25 or 0.5MG /DOS, 2 MG/1.5ML SOPN, Inject 0.5 mg into the skin once a week., Disp: 1.5 mL, Rfl: 2 .  traMADol (ULTRAM) 50 MG tablet, Take 1 tablet (50 mg total) by mouth daily as needed., Disp: 30 tablet, Rfl: 2 .  valsartan-hydrochlorothiazide (DIOVAN-HCT) 320-25 MG tablet, Take 1 tablet by mouth daily., Disp: 90 tablet, Rfl: 0  Allergies  Allergen Reactions  . Percocet [Oxycodone-Acetaminophen] Anaphylaxis    bronchospasms  . Adhesive [Tape] Itching  . Allergy Relief  [Chlorpheniramine Maleate]     Heart flutters  . Betadine  [Povidone Iodine]     blisters blisters  . Other     Betadine=blisters skin  ,Cashew Nuts,sunflower seeds-swelling and itching  . Penicillins Hives    Has patient had a PCN reaction causing immediate rash, facial/tongue/throat swelling, SOB or lightheadedness with  hypotension: no Has patient had a PCN reaction causing severe rash involving mucus membranes or skin necrosis: no Has patient had a PCN reaction that required hospitalization happened in the hospital Has patient had a PCN reaction occurring within the last 10 years: no If all of the above answers are "NO", then may proceed with Cephalosporin use.     I personally reviewed active problem list, medication list, allergies, family history, social history with the patient/caregiver today.   ROS  Ten systems reviewed and is negative except as mentioned in HPI  Positive for 10 lbs weight loss   Objective  Virtual encounter, vitals not obtained.  Body mass index is 38.62 kg/m.  Physical Exam  Awake, alert and oriented .   PHQ2/9: Depression screen [facility] 2/9 05/16/2018 02/14/2018 11/08/2017 08/02/2017 05/09/2017  Decreased Interest 0 0 0 0 0  Down, Depressed, Hopeless PHQ - 2 Score Altered sleeping 0 Tired, decreased energy 0 0 0 0 1  Change in appetite 0 1 1 0 2  Feeling bad or failure about yourself  0 0 0 0 0  Trouble concentrating 0 0 0 0 0  Moving slowly or fidgety/restless 0 0 0 0 0  Suicidal thoughts 0 0 0 0 0  PHQ-9 Score Difficult doing work/chores Not difficult at all Not difficult at all Somewhat difficult Not difficult at all Somewhat difficult  Some recent data might be hidden   PHQ-2/9 Result is positive     GAD 7 : Generalized Anxiety Score 05/16/2018 11/08/2017 03/28/2017  Nervous, Anxious, on Edge 1 0 3  Control/stop worrying Worry too much - different things Trouble relaxing Restless 1 0 1  Easily  annoyed or irritable 1 0 1  Afraid - awful might happen 1 1 1   Total GAD 7 Score 8 9 11   Anxiety Difficulty - Somewhat difficult Somewhat difficult     Fall Risk: Fall Risk  05/16/2018 02/14/2018 11/08/2017 08/02/2017 05/09/2017  Falls in the past year? 0 0 No No No  Number falls in past yr: 0 0 - - -   Injury with Fall? 0 0 - - -     Assessment & Plan  1. Major depression in remission (HCC)  - escitalopram (LEXAPRO) 10 MG tablet; Take 1 tablet (10 mg total) by mouth daily.  Dispense: 90 tablet; Refill: 0  2.GAD  - escitalopram (LEXAPRO) 10 MG tablet; Take 1 tablet (10 mg total) by mouth daily.  Dispense: 90 tablet; Refill: 0 - hydrOXYzine (ATARAX/VISTARIL) 10 MG tablet; Take 1 tablet (10 mg total) by mouth daily as needed.  Dispense: 90 tablet; Refill: 0  3. Primary osteoarthritis of both knees  - traMADol (ULTRAM) 50 MG tablet; Take 1 tablet (50 mg total) by mouth daily as needed.  Dispense: 30 tablet; Refill: 2  4. Hyperglycemia  - Semaglutide,0.25 or 0.5MG /DOS, 2 MG/1.5ML SOPN; Inject 0.5 mg into the skin once a week.  Dispense: 1.5 mL; Refill: 2  5. Essential hypertension  - amLODipine (NORVASC) 10 MG tablet; Take 1 tablet (10 mg total) by mouth daily.  Dispense: 90 tablet; Refill: 0 - valsartan-hydrochlorothiazide (DIOVAN-HCT) 320-25 MG tablet; Take 1 tablet by mouth daily.  Dispense: 90 tablet; Refill: 0  6. Pain of right clavicle   7. Morbid (severe) obesity due to excess calories (HCC)  - Semaglutide,0.25 or 0.5MG /DOS, 2 MG/1.5ML SOPN; Inject 0.5 mg into the skin once a week.  Dispense: 1.5 mL; Refill: 2  8. Hypercholesterolemia   9. Morbid (severe) obesity due to excess calories (HCC)  - Semaglutide,0.25 or 0.5MG /DOS, 2 MG/1.5ML SOPN; Inject 0.5 mg into the skin once a week.  Dispense: 1.5 mL; Refill: 2  10. Acute pain of left shoulder  - Ambulatory referral to Orthopedic Surgery  I discussed the assessment and treatment plan with the patient. The patient was provided an opportunity to ask questions and all were answered. The patient agreed with the plan and demonstrated an understanding of the instructions.  The patient was advised to call back or seek an in-person evaluation if the symptoms worsen or if the condition fails to improve as anticipated.  I  provided 25 minutes of non-face-to-face time during this encounter.

## 2018-06-03 ENCOUNTER — Encounter: Payer: Self-pay | Admitting: Family Medicine

## 2018-06-03 ENCOUNTER — Ambulatory Visit (INDEPENDENT_AMBULATORY_CARE_PROVIDER_SITE_OTHER): Payer: 59 | Admitting: Family Medicine

## 2018-06-03 DIAGNOSIS — H109 Unspecified conjunctivitis: Secondary | ICD-10-CM

## 2018-06-03 MED ORDER — POLYMYXIN B-TRIMETHOPRIM 10000-0.1 UNIT/ML-% OP SOLN: 1.0000 [drp] | Freq: Four times a day (QID) | OPHTHALMIC | 0 refills | Status: DC

## 2018-06-03 NOTE — Telephone Encounter (Signed)
Copied from CRM [id number]. Topic: Quick Communication - See Telephone Encounter >> Jun 03, 2018 12:00 PM Aretta Nip wrote: CRM for notification. See Telephone encounter for: 06/03/18. Pt wanted to make sure Dr Kathie Rhodes was aware that she sent her message via MyChart. Office line tried 3 times, pt was advised of busy lines and offered to do CRM meanwhile to give Sowles a little time to respond to message. Pt states  to please put  note in chart/ wondering if she has pink eye

## 2018-06-03 NOTE — Progress Notes (Signed)
Name: Taylor Hurst   MRN: [id number]    DOB: [birth year]   Date:06/03/2018       Progress Note  Subjective  Chief Complaint  Chief Complaint  Patient presents with  . Conjunctivitis    red and crusty    I connected with  Kryssa F Hauschild  on 06/03/18 at  1:20 PM EDT by a video enabled telemedicine application and verified that I am speaking with the correct person using two identifiers.  I discussed the limitations of evaluation and management by telemedicine and the availability of in person appointments. The patient expressed understanding and agreed to proceed. Staff also discussed with the patient that there may be a patient responsible charge related to this service. Patient Location: Home Provider Location: Home Additional Individuals present: None.  HPI  Pt presents with concern for pink eye of the left eye.  She has had injected conjunctiva and dried purulent exudate since last night.  She denies blurred vision, pain, or itching, no ocular pain or pressure, no swelling.  The right eye is not affected.  Patient Active Problem List   Diagnosis Date Noted  . Morbid (severe) obesity due to excess calories (HCC) 03/28/2017  . Hyperglycemia 12/22/2015  . Tendonitis, Achilles, right 01/11/2015  . Anxiety disorder 12/07/2014  . Complete rotator cuff tear 11/18/2014  . Hypercholesterolemia 07/30/2014  . Osteoarthritis of both knees 07/30/2014  . Chronic constipation 08/29/2011  . HTN (hypertension) 05/30/2011  . Insomnia 05/30/2011  . Obesity (BMI 30-39.9) 05/30/2011  . GERD (gastroesophageal reflux disease) 05/30/2011    Social History   Tobacco Use  . Smoking status: Never Smoker  . Smokeless tobacco: Never Used  Substance Use Topics  . Alcohol use: Yes    Alcohol/week: 0.0 standard drinks    Comment: occassionally     Current Outpatient Medications:  .  amLODipine (NORVASC) 10 MG tablet, Take 1 tablet (10 mg total) by mouth daily., Disp: 90 tablet, Rfl: 0 .   aspirin EC 81 MG tablet, Take 1 tablet (81 mg total) by mouth daily., Disp: 30 tablet, Rfl: 0 .  b complex vitamins tablet, Take 1 tablet by mouth daily., Disp: , Rfl:  .  BIOTIN PO, Take 10,000 mg by mouth daily., Disp: , Rfl:  .  Black Cohosh 540 MG CAPS, Take 540 mg by mouth daily. , Disp: , Rfl:  .  diclofenac sodium (VOLTAREN) 1 % GEL, Apply 4 g topically 4 (four) times daily., Disp: 300 g, Rfl: 1 .  escitalopram (LEXAPRO) 10 MG tablet, Take 1 tablet (10 mg total) by mouth daily., Disp: 90 tablet, Rfl: 0 .  FIBER PO, Take 5 tablets by mouth 2 (two) times daily., Disp: , Rfl:  .  hydrOXYzine (ATARAX/VISTARIL) 10 MG tablet, Take 1 tablet (10 mg total) by mouth daily as needed., Disp: 90 tablet, Rfl: 0 .  ketotifen (ZADITOR) 0.025 % ophthalmic solution, 1 drop 2 (two) times daily., Disp: , Rfl:  .  levonorgestrel (MIRENA) 20 MCG/24HR IUD, 1 each by Intrauterine route once. , Disp: , Rfl:  .  loratadine (CLARITIN) 10 MG tablet, Take 1 tablet (10 mg total) by mouth daily., Disp: 100 tablet, Rfl: 0 .  Melatonin 5 MG CAPS, Take 10 mg by mouth at bedtime. , Disp: , Rfl:  .  Multiple Vitamin (MULTI-VITAMIN DAILY PO), Take 1 tablet by mouth daily with breakfast. Women's, Disp: , Rfl:  .  Omega-3 Fatty Acids (FISH OIL) 1000 MG CAPS, Take 1,000 mg by mouth  2 (two) times daily., Disp: , Rfl:  .  omeprazole (PRILOSEC) 40 MG capsule, , Disp: , Rfl:  .  Semaglutide,0.25 or 0.5MG /DOS, 2 MG/1.5ML SOPN, Inject 0.5 mg into the skin once a week., Disp: 1.5 mL, Rfl: 2 .  traMADol (ULTRAM) 50 MG tablet, Take 1 tablet (50 mg total) by mouth daily as needed., Disp: 30 tablet, Rfl: 2 .  valsartan-hydrochlorothiazide (DIOVAN-HCT) 320-25 MG tablet, Take 1 tablet by mouth daily., Disp: 90 tablet, Rfl: 0  Allergies  Allergen Reactions  . Percocet [Oxycodone-Acetaminophen] Anaphylaxis    bronchospasms  . Adhesive [Tape] Itching  . Allergy Relief  [Chlorpheniramine Maleate]     Heart flutters  . Betadine  [Povidone  Iodine]     blisters blisters  . Other     Betadine=blisters skin  ,Cashew Nuts,sunflower seeds-swelling and itching  . Penicillins Hives    Has patient had a PCN reaction causing immediate rash, facial/tongue/throat swelling, SOB or lightheadedness with hypotension: no Has patient had a PCN reaction causing severe rash involving mucus membranes or skin necrosis: no Has patient had a PCN reaction that required hospitalization happened in the hospital Has patient had a PCN reaction occurring within the last 10 years: no If all of the above answers are "NO", then may proceed with Cephalosporin use.     I personally reviewed active problem list, medication list, allergies, notes from last encounter, lab results with the patient/caregiver today.  ROS  Ten systems reviewed and is negative except as mentioned in HPI  Objective  Virtual encounter, vitals not obtained.  There is no height or weight on file to calculate BMI.  Nursing Note and Vital Signs reviewed.  Physical Exam  Constitutional: Patient appears well-developed and well-nourished. No distress.  HENT: Head: Normocephalic and atraumatic. LEFT eye with injected conjunctiva and small amount of purulent drainage on the lashes, RIGHT eye conjunctiva is normal.  EOM's intact bilaterally. Neck: Normal range of motion. Pulmonary/Chest: Effort normal. No respiratory distress. Speaking in complete sentences Neurological: Pt is alert and oriented to person, place, and time. Coordination, speech and gait are normal.  Psychiatric: Patient has a normal mood and affect. behavior is normal. Judgment and thought content normal.  No results found for this or any previous visit (from the past 72 hour(s)).  Assessment & Plan  1. Bacterial conjunctivitis - Cool compresses PRN. - trimethoprim-polymyxin b (POLYTRIM) ophthalmic solution; Place 1 drop into the left eye every 6 (six) hours.  Dispense: 10 mL; Refill: 0  -Red flags and when to  present for emergency care or RTC including fever >101.36F, chest pain, shortness of breath, new/worsening/un-resolving symptoms, ocular pain or swelling reviewed with patient at time of visit. Follow up and care instructions discussed and provided in AVS. - I discussed the assessment and treatment plan with the patient. The patient was provided an opportunity to ask questions and all were answered. The patient agreed with the plan and demonstrated an understanding of the instructions.  I provided 11 minutes of non-face-to-face time during this encounter.  Doren CustardEmily E Boyce, FNP

## 2018-06-03 NOTE — Patient Instructions (Signed)
Bacterial Conjunctivitis, Adult  Bacterial conjunctivitis is an infection of your conjunctiva. This is the clear membrane that covers the [place] part of your eye and the inner part of your eyelid. This infection can make your eye:  · Red or pink.  · Itchy.  This condition spreads easily from person to person (is contagious) and from one eye to the other eye.  What are the causes?  · This condition is caused by germs (bacteria). You may get the infection if you come into close contact with:  ? A person who has the infection.  ? Items that have germs on them (are contaminated), such as face towels, contact lens solution, or eye makeup.  What increases the risk?  You are more likely to get this condition if you:  · Have contact with people who have the infection.  · Wear contact lenses.  · Have a sinus infection.  · Have had a recent eye injury or surgery.  · Have a weak body defense system (immune system).  · Have dry eyes.  What are the signs or symptoms?    · Thick, yellowish discharge from the eye.  · Tearing or watery eyes.  · Itchy eyes.  · Burning feeling in your eyes.  · Eye redness.  · Swollen eyelids.  · Blurred vision.  How is this treated?    · Antibiotic eye drops or ointment.  · Antibiotic medicine taken by mouth. This is used for infections that do not get better with drops or ointment or that last more than 10 days.  · Cool, wet cloths placed on the eyes.  · Artificial tears used 2-6 times a day.  Follow these instructions at home:  Medicines  · Take or apply your antibiotic medicine as told by your doctor. Do not stop taking or applying the antibiotic even if you start to feel better.  · Take or apply over-the-counter and prescription medicines only as told by your doctor.  · Do not touch your eyelid with the eye-drop bottle or the ointment tube.  Managing discomfort  · Wipe any fluid from your eye with a warm, wet washcloth or a cotton ball.  · Place a clean, cool, wet cloth on your eye. Do this for  10-20 minutes, 3-4 times per day.  General instructions  · Do not wear contacts until the infection is gone. Wear glasses until your doctor says it is okay to wear contacts again.  · Do not wear eye makeup until the infection is gone. Throw away old eye makeup.  · Change or wash your pillowcase every day.  · Do not share towels or washcloths.  · Wash your hands often with soap and water. Use paper towels to dry your hands.  · Do not touch or rub your eyes.  · Do not drive or use heavy machinery if your vision is blurred.  Contact a doctor if:  · You have a fever.  · You do not get better after 10 days.  Get help right away if:  · You have a fever and your symptoms get worse all of a sudden.  · You have very bad pain when you move your eye.  · Your face:  ? Hurts.  ? Is red.  ? Is swollen.  · You have sudden loss of vision.  Summary  · Bacterial conjunctivitis is an infection of your conjunctiva.  · This infection spreads easily from person to person.  · Wash your hands often   with soap and water. Use paper towels to dry your hands.  · Take or apply your antibiotic medicine as told by your doctor.  · Contact a doctor if you have a fever or you do not get better after 10 days.  This information is not intended to replace advice given to you by your health care provider. Make sure you discuss any questions you have with your health care provider.  Document Released: 10/11/2007 Document Revised: 08/07/2017 Document Reviewed: 08/07/2017  Elsevier Interactive Patient Education © 2019 [employer]

## 2018-06-12 ENCOUNTER — Other Ambulatory Visit: Payer: Self-pay | Admitting: Family Medicine

## 2018-06-12 ENCOUNTER — Encounter: Payer: Self-pay | Admitting: Family Medicine

## 2018-06-12 ENCOUNTER — Ambulatory Visit (INDEPENDENT_AMBULATORY_CARE_PROVIDER_SITE_OTHER): Payer: 59 | Admitting: Family Medicine

## 2018-06-12 ENCOUNTER — Other Ambulatory Visit: Payer: Self-pay

## 2018-06-12 DIAGNOSIS — H1013 Acute atopic conjunctivitis, bilateral: Secondary | ICD-10-CM

## 2018-06-12 DIAGNOSIS — H109 Unspecified conjunctivitis: Secondary | ICD-10-CM

## 2018-06-12 MED ORDER — OLOPATADINE HCL 0.2 % OP SOLN: 1.0000 [drp] | Freq: Two times a day (BID) | OPHTHALMIC | 0 refills | Status: DC

## 2018-06-12 NOTE — Progress Notes (Signed)
Name: Taylor Hurst   MRN: [id number]    DOB: [birth year]   Date:06/12/2018       Progress Note  Subjective  Chief Complaint  Chief Complaint  Patient presents with  . Medication Problem    to eye drops, irritation, redness, burning and swelling  . Conjunctivitis    I connected with  Taylor Hurst  on 06/12/18 at  2:00 PM EDT by a video enabled telemedicine application and verified that I am speaking with the correct person using two identifiers.  I discussed the limitations of evaluation and management by telemedicine and the availability of in person appointments. The patient expressed understanding and agreed to proceed. Staff also discussed with the patient that there may be a patient responsible charge related to this service. Patient Location: Home Provider Location: Office Additional Individuals present: None  HPI  Pt presents with concern for eye irritation and redness.  She was prescribed polytrim and this did not help; also used plain visine and this - both caused significant burning and irritation, conjunctiva became more irritated. She is taking Claritin daily, hydroxyzine PRN. Denies vision changes, no purulent drainage, but does have significant amoutn of clear tears.  Patient Active Problem List   Diagnosis Date Noted  . Morbid (severe) obesity due to excess calories (HCC) 03/28/2017  . Hyperglycemia 12/22/2015  . Tendonitis, Achilles, right 01/11/2015  . Anxiety disorder 12/07/2014  . Complete rotator cuff tear 11/18/2014  . Hypercholesterolemia 07/30/2014  . Osteoarthritis of both knees 07/30/2014  . Chronic constipation 08/29/2011  . HTN (hypertension) 05/30/2011  . Insomnia 05/30/2011  . Obesity (BMI 30-39.9) 05/30/2011  . GERD (gastroesophageal reflux disease) 05/30/2011    Social History   Tobacco Use  . Smoking status: Never Smoker  . Smokeless tobacco: Never Used  Substance Use Topics  . Alcohol use: Yes    Alcohol/week: 0.0 standard drinks   Comment: occassionally     Current Outpatient Medications:  .  amLODipine (NORVASC) 10 MG tablet, Take 1 tablet (10 mg total) by mouth daily., Disp: 90 tablet, Rfl: 0 .  aspirin EC 81 MG tablet, Take 1 tablet (81 mg total) by mouth daily., Disp: 30 tablet, Rfl: 0 .  b complex vitamins tablet, Take 1 tablet by mouth daily., Disp: , Rfl:  .  BIOTIN PO, Take 10,000 mg by mouth daily., Disp: , Rfl:  .  Black Cohosh 540 MG CAPS, Take 540 mg by mouth daily. , Disp: , Rfl:  .  diclofenac sodium (VOLTAREN) 1 % GEL, Apply 4 g topically 4 (four) times daily., Disp: 300 g, Rfl: 1 .  escitalopram (LEXAPRO) 10 MG tablet, Take 1 tablet (10 mg total) by mouth daily., Disp: 90 tablet, Rfl: 0 .  FIBER PO, Take 5 tablets by mouth 2 (two) times daily., Disp: , Rfl:  .  hydrOXYzine (ATARAX/VISTARIL) 10 MG tablet, Take 1 tablet (10 mg total) by mouth daily as needed., Disp: 90 tablet, Rfl: 0 .  ketotifen (ZADITOR) 0.025 % ophthalmic solution, 1 drop 2 (two) times daily., Disp: , Rfl:  .  levonorgestrel (MIRENA) 20 MCG/24HR IUD, 1 each by Intrauterine route once. , Disp: , Rfl:  .  loratadine (CLARITIN) 10 MG tablet, Take 1 tablet (10 mg total) by mouth daily., Disp: 100 tablet, Rfl: 0 .  Melatonin 5 MG CAPS, Take 10 mg by mouth at bedtime. , Disp: , Rfl:  .  Multiple Vitamin (MULTI-VITAMIN DAILY PO), Take 1 tablet by mouth daily with breakfast. Women's, Disp: ,  Rfl:  .  Omega-3 Fatty Acids (FISH OIL) 1000 MG CAPS, Take 1,000 mg by mouth 2 (two) times daily., Disp: , Rfl:  .  omeprazole (PRILOSEC) 40 MG capsule, , Disp: , Rfl:  .  Semaglutide,0.25 or 0.5MG /DOS, 2 MG/1.5ML SOPN, Inject 0.5 mg into the skin once a week., Disp: 1.5 mL, Rfl: 2 .  traMADol (ULTRAM) 50 MG tablet, Take 1 tablet (50 mg total) by mouth daily as needed., Disp: 30 tablet, Rfl: 2 .  trimethoprim-polymyxin b (POLYTRIM) ophthalmic solution, Place 1 drop into the left eye every 6 (six) hours., Disp: 10 mL, Rfl: 0 .  valsartan-hydrochlorothiazide  (DIOVAN-HCT) 320-25 MG tablet, Take 1 tablet by mouth daily., Disp: 90 tablet, Rfl: 0  Allergies  Allergen Reactions  . Percocet [Oxycodone-Acetaminophen] Anaphylaxis    bronchospasms  . Adhesive [Tape] Itching  . Allergy Relief  [Chlorpheniramine Maleate]     Heart flutters  . Betadine  [Povidone Iodine]     blisters blisters  . Other     Betadine=blisters skin  ,Cashew Nuts,sunflower seeds-swelling and itching  . Penicillins Hives    Has patient had a PCN reaction causing immediate rash, facial/tongue/throat swelling, SOB or lightheadedness with hypotension: no Has patient had a PCN reaction causing severe rash involving mucus membranes or skin necrosis: no Has patient had a PCN reaction that required hospitalization happened in the hospital Has patient had a PCN reaction occurring within the last 10 years: no If all of the above answers are "NO", then may proceed with Cephalosporin use.     I personally reviewed active problem list, medication list, allergies, notes from last encounter, lab results with the patient/caregiver today.  ROS  Ten systems reviewed and is negative except as mentioned in HPI  Objective  Virtual encounter, vitals not obtained.  There is no height or weight on file to calculate BMI.  Nursing Note and Vital Signs reviewed.  Physical Exam  Constitutional: Patient appears well-developed and well-nourished. No distress.  HENT: Head: Normocephalic and atraumatic. Bilateral conjunctiva are injected, the bilateral lids are slightly swollen/irritated in appearance. Neck: Normal range of motion. Pulmonary/Chest: Effort normal. No respiratory distress. Speaking in complete sentences Neurological: Pt is alert and oriented to person, place, and time. Coordination, speech and gait are normal.  Psychiatric: Patient has a normal mood and affect. behavior is normal. Judgment and thought content normal.  No results found for this or any previous visit (from the  past 72 hour(s)).  Assessment & Plan 1. Allergic conjunctivitis of both eyes - Advised may take her hydroxyzine TID PRN instead of at night only for 2-3 days until itching and irritation calm down.  Continue daily claritin.  STOP all other eye applications. - Olopatadine HCl 0.2 % SOLN; Apply 1 drop to eye 2 (two) times a day.  Dispense: 2.5 mL; Refill: 0  -Red flags and when to present for emergency care or RTC including fever >101.5F, chest pain, shortness of breath, new/worsening/un-resolving symptoms, reviewed with patient at time of visit. Follow up and care instructions discussed and provided in AVS. - I discussed the assessment and treatment plan with the patient. The patient was provided an opportunity to ask questions and all were answered. The patient agreed with the plan and demonstrated an understanding of the instructions.  I provided 12 minutes of non-face-to-face time during this encounter.  Doren Custard, FNP

## 2018-06-13 ENCOUNTER — Encounter: Payer: Self-pay | Admitting: Family Medicine

## 2018-07-11 ENCOUNTER — Other Ambulatory Visit: Payer: Self-pay

## 2018-07-11 DIAGNOSIS — R739 Hyperglycemia, unspecified: Secondary | ICD-10-CM

## 2018-07-11 MED ORDER — SEMAGLUTIDE(0.25 OR 0.5MG/DOS) 2 MG/1.5ML ~~LOC~~ SOPN: 0.5000 mg | PEN_INJECTOR | SUBCUTANEOUS | 1 refills | Status: DC

## 2018-07-11 NOTE — Telephone Encounter (Signed)
Patient Ozempic is too expensive at [facility] much cheaper at [facility]. Please send to [facility].

## 2018-08-19 ENCOUNTER — Other Ambulatory Visit: Payer: Self-pay | Admitting: Family Medicine

## 2018-08-19 DIAGNOSIS — M170 Bilateral primary osteoarthritis of knee: Secondary | ICD-10-CM

## 2018-08-21 ENCOUNTER — Encounter: Payer: Self-pay | Admitting: Family Medicine

## 2018-08-29 ENCOUNTER — Encounter: Payer: Self-pay | Admitting: Family Medicine

## 2018-08-29 ENCOUNTER — Other Ambulatory Visit: Payer: Self-pay

## 2018-08-29 ENCOUNTER — Ambulatory Visit (INDEPENDENT_AMBULATORY_CARE_PROVIDER_SITE_OTHER): Payer: 59 | Admitting: Family Medicine

## 2018-08-29 VITALS — BP 110/68 | HR 90 | Temp 97.1°F | Resp 16 | Ht 61.0 in | Wt 204.4 lb

## 2018-08-29 DIAGNOSIS — E7800 Pure hypercholesterolemia, unspecified: Secondary | ICD-10-CM

## 2018-08-29 DIAGNOSIS — F325 Major depressive disorder, single episode, in full remission: Secondary | ICD-10-CM | POA: Diagnosis not present

## 2018-08-29 DIAGNOSIS — Z113 Encounter for screening for infections with a predominantly sexual mode of transmission: Secondary | ICD-10-CM

## 2018-08-29 DIAGNOSIS — M170 Bilateral primary osteoarthritis of knee: Secondary | ICD-10-CM | POA: Diagnosis not present

## 2018-08-29 DIAGNOSIS — F411 Generalized anxiety disorder: Secondary | ICD-10-CM | POA: Diagnosis not present

## 2018-08-29 DIAGNOSIS — R739 Hyperglycemia, unspecified: Secondary | ICD-10-CM

## 2018-08-29 DIAGNOSIS — I10 Essential (primary) hypertension: Secondary | ICD-10-CM

## 2018-08-29 MED ORDER — TRAMADOL HCL 50 MG PO TABS: 50.0000 mg | ORAL_TABLET | Freq: Every day | ORAL | 2 refills | Status: DC | PRN

## 2018-08-29 MED ORDER — ESCITALOPRAM OXALATE 10 MG PO TABS: 10.0000 mg | ORAL_TABLET | Freq: Every day | ORAL | 1 refills | Status: DC

## 2018-08-29 MED ORDER — AMLODIPINE BESYLATE 10 MG PO TABS: 10.0000 mg | ORAL_TABLET | Freq: Every day | ORAL | 1 refills | Status: DC

## 2018-08-29 MED ORDER — HYDROXYZINE HCL 10 MG PO TABS: 10.0000 mg | ORAL_TABLET | Freq: Every day | ORAL | 1 refills | Status: DC | PRN

## 2018-08-29 NOTE — Progress Notes (Signed)
Name: Taylor GibsonJustina F Hurst   MRN: [id number]    DOB: 04/27/1965   Date:08/29/2018       Progress Note  Subjective  Chief Complaint  Chief Complaint  Patient presents with  . Depression    HPI  HTN: bpis at goal today. She deniesdizziness,chest pain, headachesor palpitation. She needs refills today   Obesity: she states she developed obesity after second child, went from 167 lbs to 225 lbs during pregnancy.Since Fall of 2019 she changed her diet, and we added Ozempic, it has been curbing her appetite her weight is down 10 lbs in the last 6 months. She states at home it is down to 202 lbs and would like to go lower   OA: she is taking Tylenolduring the day and tramadol qhs,she has been taking for many years. She had daily aching pain, worse at the end of work day,most of her pain is on her right knee, no recent effusion or erythema, she states she is feeling better, pain right now is 0/10.  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  Major Depression Mild: Pha 9is still elevated but she is doing better onLexapro, she states husbands penis problems resolved and that has improved, but states still not having intercourse very often and wants to be checked for STI. Her step son is no longer at the house and is helping.   Shoulder pain: she has large breast, had surgery on right shoulder and is now having problems on the left side, she missed appointment with Ortho because of COVID-19, she needs to call and re-schedule it   Patient Active Problem List   Diagnosis Date Noted  . Morbid (severe) obesity due to excess calories (HCC) 03/28/2017  . Hyperglycemia  12/22/2015  . Tendonitis, Achilles, right 01/11/2015  . Anxiety disorder 12/07/2014  . Complete rotator cuff tear 11/18/2014  . Hypercholesterolemia 07/30/2014  . Osteoarthritis of both knees 07/30/2014  . Chronic constipation 08/29/2011  . HTN (hypertension) 05/30/2011  . Insomnia 05/30/2011  . Obesity (BMI 30-39.9) 05/30/2011  . GERD (gastroesophageal reflux disease) 05/30/2011    Past Surgical History:  Procedure Laterality Date  . CHOLECYSTECTOMY  07/26/11  . INTRAOPERATIVE CHOLANGIOGRAM  07/26/2011   Procedure: INTRAOPERATIVE CHOLANGIOGRAM;  Surgeon: Ardeth SportsmanSteven C. Gross, MD;  Location: [facility];  Service: General;  Laterality: N/A;  . LIVER BIOPSY  07/26/2011   Procedure: LIVER BIOPSY;  Surgeon: Ardeth SportsmanSteven C. Gross, MD;  Location: [facility];  Service: General;  Laterality: N/A;  core liver biopsy   . osteomilitis     right and left leg/surgery; right 1983; left 1988  . osteomylitis  [id number]   right leg, left leg  . SHOULDER OPEN ROTATOR CUFF REPAIR Right 11/18/2014   Procedure: RIGHT SHOULDER MINI OPEN ROTATOR CUFF REPAIR SUBACROMIAL DECOMPRESSION  ;  Surgeon: Jene EveryJeffrey Beane, MD;  Location: [facility];  Service: Orthopedics;  Laterality: Right;    Family History  Problem Relation Age of Onset  . COPD Mother   . Hypertension Mother   . Hyperlipidemia Mother   . Alcohol abuse Father   . Cancer Maternal Aunt  breast  . Breast cancer Paternal Aunt     Social History   Socioeconomic History  . Marital status: Married    Spouse name: Not on file  . Number of children: Not on file  . Years of education: Not on file  . Highest education level: Not on file  Occupational History  . Not on file  Social Needs  . Financial resource strain: Not hard at all  . Food insecurity    Worry: Never true    Inability: Never true  . Transportation needs    Medical: No    Non-medical: No  Tobacco Use  . Smoking status: Never Smoker  . Smokeless tobacco: Never Used  Substance and Sexual  Activity  . Alcohol use: Yes    Alcohol/week: 0.0 standard drinks    Comment: occassionally  . Drug use: No  . Sexual activity: Yes    Birth control/protection: I.U.D.    Comment: Mirena  Lifestyle  . Physical activity    Days per week: 0 days    Minutes per session: 0 min  . Stress: Not at all  Relationships  . Social Herbalist on phone: Three times a week    Gets together: Three times a week    Attends religious service: Never    Active member of club or organization: No    Attends meetings of clubs or organizations: Never    Relationship status: Married  . Intimate partner violence    Fear of current or ex partner: No    Emotionally abused: No    Physically abused: No    Forced sexual activity: No  Other Topics Concern  . Not on file  Social History Narrative  . Not on file     Current Outpatient Medications:  .  amLODipine (NORVASC) 10 MG tablet, Take 1 tablet (10 mg total) by mouth daily., Disp: 90 tablet, Rfl: 0 .  aspirin EC 81 MG tablet, Take 1 tablet (81 mg total) by mouth daily., Disp: 30 tablet, Rfl: 0 .  b complex vitamins tablet, Take 1 tablet by mouth daily., Disp: , Rfl:  .  BIOTIN PO, Take 10,000 mg by mouth daily., Disp: , Rfl:  .  Black Cohosh 540 MG CAPS, Take 540 mg by mouth daily. , Disp: , Rfl:  .  diclofenac sodium (VOLTAREN) 1 % GEL, Apply 4 g topically 4 (four) times daily., Disp: 300 g, Rfl: 1 .  escitalopram (LEXAPRO) 10 MG tablet, Take 1 tablet (10 mg total) by mouth daily., Disp: 90 tablet, Rfl: 0 .  FIBER PO, Take 5 tablets by mouth 2 (two) times daily., Disp: , Rfl:  .  hydrOXYzine (ATARAX/VISTARIL) 10 MG tablet, Take 1 tablet (10 mg total) by mouth daily as needed., Disp: 90 tablet, Rfl: 0 .  ketotifen (ZADITOR) 0.025 % ophthalmic solution, 1 drop 2 (two) times daily., Disp: , Rfl:  .  levonorgestrel (MIRENA) 20 MCG/24HR IUD, 1 each by Intrauterine route once. , Disp: , Rfl:  .  loratadine (CLARITIN) 10 MG tablet, Take 1 tablet  (10 mg total) by mouth daily., Disp: 100 tablet, Rfl: 0 .  Melatonin 5 MG CAPS, Take 10 mg by mouth at bedtime. , Disp: , Rfl:  .  Multiple Vitamin (MULTI-VITAMIN DAILY PO), Take 1 tablet by mouth daily with breakfast. Women's, Disp: , Rfl:  .  Omega-3 Fatty Acids (FISH OIL) 1000 MG CAPS, Take 1,000 mg by mouth 2 (two) times daily., Disp: , Rfl:  .  omeprazole (PRILOSEC) 40 MG capsule, , Disp: , Rfl:  .  Semaglutide,0.25 or 0.5MG /DOS, 2 MG/1.5ML SOPN, Inject 0.5 mg into the skin once a week., Disp: 9 mL, Rfl: 1 .  traMADol (ULTRAM) 50 MG tablet, TAKE 1 TABLET (50 MG TOTAL) BY MOUTH DAILY AS NEEDED., Disp: 30 tablet, Rfl: 0 .  valsartan-hydrochlorothiazide (DIOVAN-HCT) 320-25 MG tablet, Take 1 tablet by mouth daily., Disp: 90 tablet, Rfl: 0 .  Olopatadine HCl 0.2 % SOLN, Apply 1 drop to eye 2 (two) times a day., Disp: 2.5 mL, Rfl: 0 .  trimethoprim-polymyxin b (POLYTRIM) ophthalmic solution, Place 1 drop into the left eye every 6 (six) hours., Disp: 10 mL, Rfl: 0  Allergies  Allergen Reactions  . Percocet [Oxycodone-Acetaminophen] Anaphylaxis    bronchospasms  . Adhesive [Tape] Itching  . Allergy Relief  [Chlorpheniramine Maleate]     Heart flutters  . Betadine  [Povidone Iodine]     blisters blisters  . Other     Betadine=blisters skin  ,Cashew Nuts,sunflower seeds-swelling and itching  . Penicillins Hives    Has patient had a PCN reaction causing immediate rash, facial/tongue/throat swelling, SOB or lightheadedness with hypotension: no Has patient had a PCN reaction causing severe rash involving mucus membranes or skin necrosis: no Has patient had a PCN reaction that required hospitalization happened in the hospital Has patient had a PCN reaction occurring within the last 10 years: no If all of the above answers are "NO", then may proceed with Cephalosporin use.     I personally reviewed active problem list, medication list, allergies, family history, social history with the  patient/caregiver today.   ROS  Constitutional: Negative for fever or weight change.  Respiratory: Negative for cough and shortness of breath.   Cardiovascular: Negative for chest pain or palpitations.  Gastrointestinal: Negative for abdominal pain, no bowel changes.  Musculoskeletal: Negative for gait problem or joint swelling.  Skin: Negative for rash.  Neurological: Negative for dizziness or headache.  No other specific complaints in a complete review of systems (except as listed in HPI above).  Objective  Vitals:   08/29/18 1133  BP: 110/68  Pulse: 90  Resp: 16  Temp: (!) 97.1 F (36.2 C)  TempSrc: Temporal  SpO2: 97%  Weight: 204 lb 6.4 oz (92.7 kg)  Height: 5\' 1"  (1.549 m)    Body mass index is 38.62 kg/m.  Physical Exam  Constitutional: Patient appears well-developed and well-nourished. Obese  No distress.  HEENT: head atraumatic, normocephalic, pupils equal and reactive to light,  neck supple Cardiovascular: Normal rate, regular rhythm and normal heart sounds.  2/6 soft systolic ejection  murmur heard. No BLE edema. Pulmonary/Chest: Effort normal and breath sounds normal. No respiratory distress. Abdominal: Soft.  There is no tenderness. Psychiatric: Patient has a normal mood and affect. behavior is normal. Judgment and thought content normal.  PHQ2/9: Depression screen [facility] 2/9 08/29/2018 06/12/2018 06/12/2018 06/03/2018 05/16/2018  Decreased Interest 0 0 0 0 0  Down, Depressed, Hopeless 0 0 0 0 1  PHQ - 2 Score 0 0 0 0 1  Altered sleeping 0 0 0 0 0  Tired, decreased energy 0 0 0 0 0  Change in appetite 0 0 0 0 0  Feeling bad or failure about yourself  0 0 0 0 0  Trouble concentrating 0 0 0 0 0  Moving slowly or fidgety/restless 0 0 0 0 0  Suicidal thoughts 0 0 0 0 0  PHQ-9 Score 0 0 0 0 1  Difficult doing work/chores - Not difficult at all Not difficult at all Not difficult at all Not difficult at all  Some recent data might be hidden    phq 9 is  negative   Fall Risk: Fall Risk  08/29/2018 06/12/2018 06/03/2018 05/16/2018 02/14/2018  Falls in the past year? 0 0 0 0 0  Number falls in past yr: 0 0 0 0 0  Injury with Fall? 0 0 0 0 0  Follow up - Falls evaluation completed - - -     Assessment & Plan  1. Essential hypertension  - amLODipine (NORVASC) 10 MG tablet; Take 1 tablet (10 mg total) by mouth daily.  Dispense: 90 tablet; Refill: 1 - COMPLETE METABOLIC PANEL WITH GFR - CBC with Differential/Platelet  2. Generalized anxiety disorder  - escitalopram (LEXAPRO) 10 MG tablet; Take 1 tablet (10 mg total) by mouth daily.  Dispense: 90 tablet; Refill: 1 - hydrOXYzine (ATARAX/VISTARIL) 10 MG tablet; Take 1 tablet (10 mg total) by mouth daily as needed.  Dispense: 90 tablet; Refill: 1  3. Major depression in remission (HCC)  - escitalopram (LEXAPRO) 10 MG tablet; Take 1 tablet (10 mg total) by mouth daily.  Dispense: 90 tablet; Refill: 1  4. Primary osteoarthritis of both knees  - traMADol (ULTRAM) 50 MG tablet; Take 1 tablet (50 mg total) by mouth daily as needed. Fill 09-03 and monthly after that  Dispense: 30 tablet; Refill: 2  5. Routine screening for STI (sexually transmitted infection)  - Hepatitis panel, acute - RPR - HIV antibody (with reflex)  6. Hyperglycemia  - Hemoglobin A1c  7. Hypercholesterolemia  - Lipid panel

## 2018-09-01 LAB — CBC WITH DIFFERENTIAL/PLATELET
Absolute Monocytes: 464 cells/uL (ref 200–950)
Basophils Absolute: 52 cells/uL (ref 0–200)
Basophils Relative: 0.9 %
Eosinophils Absolute: 365 cells/uL (ref 15–500)
Eosinophils Relative: 6.3 %
HCT: 37.6 % (ref 35.0–45.0)
Hemoglobin: 12.3 g/dL (ref 11.7–15.5)
Lymphs Abs: 2065 cells/uL (ref 850–3900)
MCH: 28.9 pg (ref 27.0–33.0)
MCHC: 32.7 g/dL (ref 32.0–36.0)
MCV: 88.5 fL (ref 80.0–100.0)
MPV: 10.7 fL (ref 7.5–12.5)
Monocytes Relative: 8 %
Neutro Abs: 2854 cells/uL (ref 1500–7800)
Neutrophils Relative %: 49.2 %
Platelets: 314 10*3/uL (ref 140–400)
RBC: 4.25 10*6/uL (ref 3.80–5.10)
RDW: 12.2 % (ref 11.0–15.0)
Total Lymphocyte: 35.6 %
WBC: 5.8 10*3/uL (ref 3.8–10.8)

## 2018-09-01 LAB — COMPLETE METABOLIC PANEL WITH GFR
AG Ratio: 1.4 (calc) (ref 1.0–2.5)
ALT: 16 U/L (ref 6–29)
AST: 15 U/L (ref 10–35)
Albumin: 4.1 g/dL (ref 3.6–5.1)
Alkaline phosphatase (APISO): 65 U/L (ref 37–153)
BUN: 12 mg/dL (ref 7–25)
CO2: 30 mmol/L (ref 20–32)
Calcium: 9.7 mg/dL (ref 8.6–10.4)
Chloride: 101 mmol/L (ref 98–110)
Creat: 0.88 mg/dL (ref 0.50–1.05)
GFR, Est African American: 87 mL/min/{1.73_m2} (ref >=60)
GFR, Est Non African American: 75 mL/min/{1.73_m2} (ref >=60)
Globulin: 2.9 g/dL (calc) (ref 1.9–3.7)
Glucose, Bld: 91 mg/dL (ref 65–99)
Potassium: 3.4 mmol/L — ABNORMAL LOW (ref 3.5–5.3)
Sodium: 141 mmol/L (ref 135–146)
Total Bilirubin: 0.7 mg/dL (ref 0.2–1.2)
Total Protein: 7 g/dL (ref 6.1–8.1)

## 2018-09-01 LAB — HEMOGLOBIN A1C
Hgb A1c MFr Bld: 5.3 % of total Hgb (ref <5.7)
Mean Plasma Glucose: 105 (calc)
eAG (mmol/L): 5.8 (calc)

## 2018-09-01 LAB — HEPATITIS PANEL, ACUTE
Hep A IgM: NONREACTIVE
Hep B C IgM: NONREACTIVE
Hepatitis B Surface Ag: NONREACTIVE
Hepatitis C Ab: NONREACTIVE
SIGNAL TO CUT-OFF: 0.01 (ref <1.00)

## 2018-09-01 LAB — RPR: RPR Ser Ql: NONREACTIVE

## 2018-09-01 LAB — LIPID PANEL
Cholesterol: 218 mg/dL — ABNORMAL HIGH (ref <200)
HDL: 61 mg/dL (ref >=50)
LDL Cholesterol (Calc): 137 mg/dL (calc) — ABNORMAL HIGH
Non-HDL Cholesterol (Calc): 157 mg/dL (calc) — ABNORMAL HIGH (ref <130)
Total CHOL/HDL Ratio: 3.6 (calc) (ref <5.0)
Triglycerides: 98 mg/dL (ref <150)

## 2018-09-01 LAB — HIV ANTIBODY (ROUTINE TESTING W REFLEX): HIV 1&2 Ab, 4th Generation: NONREACTIVE

## 2018-09-03 ENCOUNTER — Encounter: Payer: Self-pay | Admitting: Family Medicine

## 2018-09-05 ENCOUNTER — Encounter: Payer: Self-pay | Admitting: Family Medicine

## 2018-09-23 ENCOUNTER — Encounter: Payer: 59 | Admitting: Obstetrics and Gynecology

## 2018-09-25 ENCOUNTER — Other Ambulatory Visit: Payer: Self-pay

## 2018-09-25 ENCOUNTER — Ambulatory Visit (INDEPENDENT_AMBULATORY_CARE_PROVIDER_SITE_OTHER): Payer: 59 | Admitting: Obstetrics and Gynecology

## 2018-09-25 ENCOUNTER — Encounter: Payer: Self-pay | Admitting: Obstetrics and Gynecology

## 2018-09-25 VITALS — BP 117/76 | HR 78 | Ht 61.0 in | Wt 207.2 lb

## 2018-09-25 DIAGNOSIS — Z975 Presence of (intrauterine) contraceptive device: Secondary | ICD-10-CM | POA: Diagnosis not present

## 2018-09-25 DIAGNOSIS — Z1231 Encounter for screening mammogram for malignant neoplasm of breast: Secondary | ICD-10-CM | POA: Diagnosis not present

## 2018-09-25 DIAGNOSIS — E669 Obesity, unspecified: Secondary | ICD-10-CM

## 2018-09-25 DIAGNOSIS — Z01419 Encounter for gynecological examination (general) (routine) without abnormal findings: Secondary | ICD-10-CM

## 2018-09-25 DIAGNOSIS — E7800 Pure hypercholesterolemia, unspecified: Secondary | ICD-10-CM

## 2018-09-25 DIAGNOSIS — Z23 Encounter for immunization: Secondary | ICD-10-CM | POA: Diagnosis not present

## 2018-09-25 DIAGNOSIS — I10 Essential (primary) hypertension: Secondary | ICD-10-CM

## 2018-09-25 DIAGNOSIS — E876 Hypokalemia: Secondary | ICD-10-CM

## 2018-09-25 NOTE — Progress Notes (Signed)
GYNECOLOGY CLINIC PROGRESS NOTE  Subjective:    Taylor Hurst is a 53 y.o. married P74 female who presents for an annual exam. The patient has no complaints today.The patient is sexually active.  The patient wears seatbelts: yes. The patient participates in regular exercise: yes. Has the patient ever been transfused or tattooed?: tattooed and blood transfusion in 1983.  The patient reports that there is not domestic violence in her life.    Menstrual History: Menarche age: 40 No LMP recorded. (Menstrual status: IUD).  Denies h/o abnormal menses or STIs. Last pap: 10/12/2016 and was normal.  Denies h/o abnormal pap smears.  Last mammogram: 10/2017 and was normal.  Last Colonoscopy: 05/2015, normal. Also had endoscopy which noted small gastric ulcer.  PCP: Steele Sizer, MD.  [facility]   Past Medical History:  Diagnosis Date  . Achilles tendinitis 09/23/2014   Right leg   . Arthritis   . Chronic cholecystitis 05/30/2011  . Gastric ulcer   . GERD (gastroesophageal reflux disease)    hx of pt currently has no issues since gallbladder surgery   . Heart murmur   . Hepatitis    2013 chronic active hepatitis during gallbladder issues  . History of blood transfusion    19983  . Hypertension   . Nausea   . Osteomyelitis ([place])    1983 right leg;1988 left leg  . Torn rotator cuff 09/23/2014   Right side     Past Surgical History:  Procedure Laterality Date  . CHOLECYSTECTOMY  07/26/11  . INTRAOPERATIVE CHOLANGIOGRAM  07/26/2011   Procedure: INTRAOPERATIVE CHOLANGIOGRAM;  Surgeon: Adin Hector, MD;  Location: [facility];  Service: General;  Laterality: N/A;  . LIVER BIOPSY  07/26/2011   Procedure: LIVER BIOPSY;  Surgeon: Adin Hector, MD;  Location: [facility];  Service: General;  Laterality: N/A;  core liver biopsy   . osteomilitis     right and left leg/surgery; right 1983; left 1988  . osteomylitis  [id number]   right leg, left leg  . SHOULDER OPEN ROTATOR CUFF  REPAIR Right 11/18/2014   Procedure: RIGHT SHOULDER MINI OPEN ROTATOR CUFF REPAIR SUBACROMIAL DECOMPRESSION  ;  Surgeon: Susa Day, MD;  Location: [facility];  Service: Orthopedics;  Laterality: Right;    Family History  Problem Relation Age of Onset  . COPD Mother   . Hypertension Mother   . Hyperlipidemia Mother   . Alcohol abuse Father   . Cancer Maternal Aunt        breast  . Breast cancer Paternal Aunt     Social History   Socioeconomic History  . Marital status: Married    Spouse name: Not on file  . Number of children: Not on file  . Years of education: Not on file  . Highest education level: Not on file  Occupational History  . Not on file  Social Needs  . Financial resource strain: Not hard at all  . Food insecurity    Worry: Never true    Inability: Never true  . Transportation needs    Medical: No    Non-medical: No  Tobacco Use  . Smoking status: Never Smoker  . Smokeless tobacco: Never Used  Substance and Sexual Activity  . Alcohol use: Yes    Alcohol/week: 0.0 standard drinks    Comment: occassionally  . Drug use: No  . Sexual activity: Yes    Birth control/protection: I.U.D.    Comment: Mirena  Lifestyle  . Physical  activity    Days per week: 0 days    Minutes per session: 0 min  . Stress: Not at all  Relationships  . Social Musician on phone: Three times a week    Gets together: Three times a week    Attends religious service: Never    Active member of club or organization: No    Attends meetings of clubs or organizations: Never    Relationship status: Married  . Intimate partner violence    Fear of current or ex partner: No    Emotionally abused: No    Physically abused: No    Forced sexual activity: No  Other Topics Concern  . Not on file  Social History Narrative  . Not on file    Current Outpatient Medications on File Prior to Visit  Medication Sig Dispense Refill  . amLODipine (NORVASC) 10 MG tablet Take 1 tablet  (10 mg total) by mouth daily. 90 tablet 1  . aspirin EC 81 MG tablet Take 1 tablet (81 mg total) by mouth daily. 30 tablet 0  . b complex vitamins tablet Take 1 tablet by mouth daily.    Marland Kitchen BIOTIN PO Take 10,000 mg by mouth daily.    . Black Cohosh 540 MG CAPS Take 540 mg by mouth daily.     . diclofenac sodium (VOLTAREN) 1 % GEL Apply 4 g topically 4 (four) times daily. 300 g 1  . escitalopram (LEXAPRO) 10 MG tablet Take 1 tablet (10 mg total) by mouth daily. 90 tablet 1  . FIBER PO Take 5 tablets by mouth 2 (two) times daily.    . hydrOXYzine (ATARAX/VISTARIL) 10 MG tablet Take 1 tablet (10 mg total) by mouth daily as needed. 90 tablet 1  . ketotifen (ZADITOR) 0.025 % ophthalmic solution 1 drop 2 (two) times daily.    Marland Kitchen levonorgestrel (MIRENA) 20 MCG/24HR IUD 1 each by Intrauterine route once.     . Melatonin 5 MG CAPS Take 10 mg by mouth at bedtime.     . Multiple Vitamin (MULTI-VITAMIN DAILY PO) Take 1 tablet by mouth daily with breakfast. Women's    . Omega-3 Fatty Acids (FISH OIL) 1000 MG CAPS Take 1,000 mg by mouth 2 (two) times daily.    Marland Kitchen omeprazole (PRILOSEC) 40 MG capsule     . Semaglutide,0.25 or 0.5MG /DOS, 2 MG/1.5ML SOPN Inject 0.5 mg into the skin once a week. 9 mL 1  . traMADol (ULTRAM) 50 MG tablet Take 1 tablet (50 mg total) by mouth daily as needed. Fill 09-03 and monthly after that 30 tablet 2  . valsartan-hydrochlorothiazide (DIOVAN-HCT) 320-25 MG tablet Take 1 tablet by mouth daily. 90 tablet 0   No current facility-administered medications on file prior to visit.     Allergies  Allergen Reactions  . Percocet [Oxycodone-Acetaminophen] Anaphylaxis    bronchospasms  . Adhesive [Tape] Itching  . Allergy Relief  [Chlorpheniramine Maleate]     Heart flutters  . Betadine  [Povidone Iodine]     blisters blisters  . Other     Betadine=blisters skin  ,Cashew Nuts,sunflower seeds-swelling and itching  . Penicillins Hives    Has patient had a PCN reaction causing  immediate rash, facial/tongue/throat swelling, SOB or lightheadedness with hypotension: no Has patient had a PCN reaction causing severe rash involving mucus membranes or skin necrosis: no Has patient had a PCN reaction that required hospitalization happened in the hospital Has patient had a PCN reaction occurring within the  last 10 years: no If all of the above answers are "NO", then may proceed with Cephalosporin use.      Review of Systems Constitutional: negative for chills, fatigue, fevers and sweats Eyes: negative for irritation, redness and visual disturbance Ears, nose, mouth, throat, and face: negative for hearing loss, nasal congestion, snoring and tinnitus Respiratory: negative for asthma, cough, sputum Cardiovascular: negative for chest pain, dyspnea, exertional chest pressure/discomfort, irregular heart beat, palpitations and syncope Gastrointestinal: negative for abdominal pain, change in bowel habits, nausea and vomiting Genitourinary: negative for abnormal menstrual periods, genital lesions, sexual problems and vaginal discharge, dysuria and urinary incontinence Integument/breast: negative for breast lump, breast tenderness and nipple discharge Hematologic/lymphatic: negative for bleeding and easy bruising Musculoskeletal:negative for back pain and muscle weakness.   Neurological: negative for dizziness, headaches, vertigo and weakness Endocrine: negative for diabetic symptoms including polydipsia, polyuria and skin dryness Allergic/Immunologic: negative for hay fever and urticaria    Objective:   Blood pressure 117/76, pulse 78, height 5\' 1"  (1.549 m), weight 207 lb 3.2 oz (94 kg). Body mass index is 39.15 kg/m.   General Appearance:    Alert, cooperative, no distress, appears stated age; moderately obese  Head:    Normocephalic, without obvious abnormality, atraumatic  Eyes:    PERRL, conjunctiva/corneas clear, EOM's intact, both eyes  Ears:    Normal external ear  canals, both ears  Nose:   Nares normal, septum midline, mucosa normal, no drainage or sinus tenderness  Throat:   Lips, mucosa, and tongue normal; teeth and gums normal  Neck:   Supple, symmetrical, trachea midline, no adenopathy; thyroid: no enlargement/tenderness/nodules; no carotid bruit or JVD  Back:     Symmetric, no curvature, ROM normal, no CVA tenderness  Lungs:     Clear to auscultation bilaterally, respirations unlabored  Chest Wall:    No tenderness or deformity   Heart:    Regular rate and rhythm, S1 and S2 normal, no murmur, rub or gallop  Breast Exam:    No tenderness, masses, or nipple abnormality  Abdomen:     Soft, non-tender, bowel sounds active all four quadrants, no masses, no organomegaly.   Genitalia:    Pelvic:external genitalia normal. Vagina without lesions, discharge, or tenderness; rectovaginal septum  normal. Cervix normal in appearance, no cervical motion tenderness. IUD threads visualized, ~ 1.5 cm in length. Uterus normal size and shape, mobile, nontender.  No adnexal masses or tenderness.   Rectal:    Normal external sphincter.  No hemorrhoids appreciated. Internal exam not done.   Extremities:   Extremities normal, atraumatic, no cyanosis or edema  Pulses:   2+ and symmetric all extremities  Skin:   Skin color, texture, turgor normal, no rashes or lesions  Lymph nodes:   Cervical, supraclavicular, and axillary nodes normal  Neurologic:   CNII-XII intact, normal strength, sensation and reflexes throughout     Labs:   Lab Results  Component Value Date   WBC 5.8 08/29/2018   HGB 12.3 08/29/2018   HCT 37.6 08/29/2018   MCV 88.5 08/29/2018   PLT 314 08/29/2018    Lab Results  Component Value Date   CREATININE 0.88 08/29/2018   BUN 12 08/29/2018   NA 141 08/29/2018   K 3.4 (L) 08/29/2018   CL 101 08/29/2018   CO2 30 08/29/2018    Lab Results  Component Value Date   ALT 16 08/29/2018   AST 15 08/29/2018   ALKPHOS 59 06/28/2016   BILITOT 0.7  08/29/2018  Lab Results  Component Value Date   CHOL 218 (H) 08/29/2018   HDL 61 08/29/2018   LDLCALC 137 (H) 08/29/2018   TRIG 98 08/29/2018   CHOLHDL 3.6 08/29/2018     Lab Results  Component Value Date   TSH 0.79 08/02/2017    Lab Results  Component Value Date   HGBA1C 5.3 08/29/2018    Assessment:   Routine gynecologic exam.   H/o HTN (controlled).  Obesity (Class II) Dyslipidemia IUD in place Mild hypokalemia  Plan:     Blood tests: None ordered. Up to date Breast self exam technique reviewed and patient encouraged to perform self-exam monthly. Discussed healthy lifestyle modifications. Pap smear up to date.  Next due in 1 year.  Contraception: Mirena IUD (placed 10/2014). Desires to leave in for full 7 years.  Screening colonoscopy up to date.  Mammogram due.  Will order.  Dyslipidemia and HTN are followed by PCP.  Mild hypokalemia, patient trying to supplement with dietary intake.  Flu vaccine given today.  Follow up in 1 year for annual exam.    Hildred Laserherry, Greidy Sherard, MD [facility]

## 2018-09-25 NOTE — Patient Instructions (Signed)
Preventive Care 53-53 Years Old, Female Preventive care refers to visits with your health care provider and lifestyle choices that can promote health and wellness. This includes:  A yearly physical exam. This may also be called an annual well check.  Regular dental visits and eye exams.  Immunizations.  Screening for certain conditions.  Healthy lifestyle choices, such as eating a healthy diet, getting regular exercise, not using drugs or products that contain nicotine and tobacco, and limiting alcohol use. What can I expect for my preventive care visit? Physical exam Your health care provider will check your:  Height and weight. This may be used to calculate body mass index (BMI), which tells if you are at a healthy weight.  Heart rate and blood pressure.  Skin for abnormal spots. Counseling Your health care provider may ask you questions about your:  Alcohol, tobacco, and drug use.  Emotional well-being.  Home and relationship well-being.  Sexual activity.  Eating habits.  Work and work environment.  Method of birth control.  Menstrual cycle.  Pregnancy history. What immunizations do I need?  Influenza (flu) vaccine  This is recommended every year. Tetanus, diphtheria, and pertussis (Tdap) vaccine  You may need a Td booster every 10 years. Varicella (chickenpox) vaccine  You may need this if you have not been vaccinated. Zoster (shingles) vaccine  You may need this after age 60. Measles, mumps, and rubella (MMR) vaccine  You may need at least one dose of MMR if you were born in 1957 or later. You may also need a second dose. Pneumococcal conjugate (PCV13) vaccine  You may need this if you have certain conditions and were not previously vaccinated. Pneumococcal polysaccharide (PPSV23) vaccine  You may need one or two doses if you smoke cigarettes or if you have certain conditions. Meningococcal conjugate (MenACWY) vaccine  You may need this if you  have certain conditions. Hepatitis A vaccine  You may need this if you have certain conditions or if you travel or work in places where you may be exposed to hepatitis A. Hepatitis B vaccine  You may need this if you have certain conditions or if you travel or work in places where you may be exposed to hepatitis B. Haemophilus influenzae type b (Hib) vaccine  You may need this if you have certain conditions. Human papillomavirus (HPV) vaccine  If recommended by your health care provider, you may need three doses over 6 months. You may receive vaccines as individual doses or as more than one vaccine together in one shot (combination vaccines). Talk with your health care provider about the risks and benefits of combination vaccines. What tests do I need? Blood tests  Lipid and cholesterol levels. These may be checked every 5 years, or more frequently if you are over 53 years old.  Hepatitis C test.  Hepatitis B test. Screening  Lung cancer screening. You may have this screening every year starting at age 53 if you have a 30-pack-year history of smoking and currently smoke or have quit within the past 15 years.  Colorectal cancer screening. All adults should have this screening starting at age 53 and continuing until age 53. Your health care provider may recommend screening at age 45 if you are at increased risk. You will have tests every 1-10 years, depending on your results and the type of screening test.  Diabetes screening. This is done by checking your blood sugar (glucose) after you have not eaten for a while (fasting). You may have this   done every 1-3 years.  Mammogram. This may be done every 1-2 years. Talk with your health care provider about when you should start having regular mammograms. This may depend on whether you have a family history of breast cancer.  BRCA-related cancer screening. This may be done if you have a family history of breast, ovarian, tubal, or peritoneal  cancers.  Pelvic exam and Pap test. This may be done every 3 years starting at age 53. Starting at age 53, this may be done every 5 years if you have a Pap test in combination with an HPV test. Other tests  Sexually transmitted disease (STD) testing.  Bone density scan. This is done to screen for osteoporosis. You may have this scan if you are at high risk for osteoporosis. Follow these instructions at home: Eating and drinking  Eat a diet that includes fresh fruits and vegetables, whole grains, lean protein, and low-fat dairy.  Take vitamin and mineral supplements as recommended by your health care provider.  Do not drink alcohol if: ? Your health care provider tells you not to drink. ? You are pregnant, may be pregnant, or are planning to become pregnant.  If you drink alcohol: ? Limit how much you have to 0-1 drink a day. ? Be aware of how much alcohol is in your drink. In the [place] one drink equals one 12 oz bottle of beer (355 mL), one 5 oz glass of wine (148 mL), or one 1 oz glass of hard liquor (44 mL). Lifestyle  Take daily care of your teeth and gums.  Stay active. Exercise for at least 30 minutes on 5 or more days each week.  Do not use any products that contain nicotine or tobacco, such as cigarettes, e-cigarettes, and chewing tobacco. If you need help quitting, ask your health care provider.  If you are sexually active, practice safe sex. Use a condom or other form of birth control (contraception) in order to prevent pregnancy and STIs (sexually transmitted infections).  If told by your health care provider, take low-dose aspirin daily starting at age 53. What's next?  Visit your health care provider once a year for a well check visit.  Ask your health care provider how often you should have your eyes and teeth checked.  Stay up to date on all vaccines. This information is not intended to replace advice given to you by your health care provider. Make sure you  discuss any questions you have with your health care provider. Document Released: 01/28/2015 Document Revised: 09/12/2017 Document Reviewed: 09/12/2017 Elsevier Patient Education  2020 [place] self-awareness is knowing how your breasts look and feel. Doing breast self-awareness is important. It allows you to catch a breast problem early while it is still small and can be treated. All women should do breast self-awareness, including women who have had breast implants. Tell your doctor if you notice a change in your breasts. What you need:  A mirror.  A well-lit room. How to do a breast self-exam A breast self-exam is one way to learn what is normal for your breasts and to check for changes. To do a breast self-exam: Look for changes  1. Take off all the clothes above your waist. 2. Stand in front of a mirror in a room with good lighting. 3. Put your hands on your hips. 4. Push your hands down. 5. Look at your breasts and nipples in the mirror to see if one breast or nipple looks  different from the other. Check to see if: ? The shape of one breast is different. ? The size of one breast is different. ? There are wrinkles, dips, and bumps in one breast and not the other. 6. Look at each breast for changes in the skin, such as: ? Redness. ? Scaly areas. 7. Look for changes in your nipples, such as: ? Liquid around the nipples. ? Bleeding. ? Dimpling. ? Redness. ? A change in where the nipples are. Feel for changes  1. Lie on your back on the floor. 2. Feel each breast. To do this, follow these steps: ? Pick a breast to feel. ? Put the arm closest to that breast above your head. ? Use your other arm to feel the nipple area of your breast. Feel the area with the pads of your three middle fingers by making small circles with your fingers. For the first circle, press lightly. For the second circle, press harder. For the third circle, press even harder.  ? Keep making circles with your fingers at the different pressures as you move down your breast. Stop when you feel your ribs. ? Move your fingers a little toward the center of your body. ? Start making circles with your fingers again, this time going up until you reach your collarbone. ? Keep making up-and-down circles until you reach your armpit. Remember to keep using the three pressures. ? Feel the other breast in the same way. 3. Sit or stand in the tub or shower. 4. With soapy water on your skin, feel each breast the same way you did in step 2 when you were lying on the floor. Write down what you find Writing down what you find can help you remember what to tell your doctor. Write down:  What is normal for each breast.  Any changes you find in each breast, including: ? The kind of changes you find. ? Whether you have pain. ? Size and location of any lumps.  When you last had your menstrual period. General tips  Check your breasts every month.  If you are breastfeeding, the best time to check your breasts is after you feed your baby or after you use a breast pump.  If you get menstrual periods, the best time to check your breasts is 5-7 days after your menstrual period is over.  With time, you will become comfortable with the self-exam, and you will begin to know if there are changes in your breasts. Contact a doctor if you:  See a change in the shape or size of your breasts or nipples.  See a change in the skin of your breast or nipples, such as red or scaly skin.  Have fluid coming from your nipples that is not normal.  Find a lump or thick area that was not there before.  Have pain in your breasts.  Have any concerns about your breast health. Summary  Breast self-awareness includes looking for changes in your breasts, as well as feeling for changes within your breasts.  Breast self-awareness should be done in front of a mirror in a well-lit room.  You should  check your breasts every month. If you get menstrual periods, the best time to check your breasts is 5-7 days after your menstrual period is over.  Let your doctor know of any changes you see in your breasts, including changes in size, changes on the skin, pain or tenderness, or fluid from your nipples that is not normal. This  information is not intended to replace advice given to you by your health care provider. Make sure you discuss any questions you have with your health care provider. Document Released: 06/20/2007 Document Revised: 08/20/2017 Document Reviewed: 08/20/2017 Elsevier Patient Education  2020 [employer].

## 2018-09-25 NOTE — Progress Notes (Signed)
Pt is present for annual exam. Pt stated that she was doing well no problems.  

## 2018-10-21 ENCOUNTER — Other Ambulatory Visit: Payer: Self-pay | Admitting: Family Medicine

## 2018-10-21 DIAGNOSIS — I10 Essential (primary) hypertension: Secondary | ICD-10-CM

## 2018-11-13 ENCOUNTER — Other Ambulatory Visit: Payer: Self-pay

## 2018-11-13 ENCOUNTER — Ambulatory Visit
Admission: RE | Admit: 2018-11-13 | Discharge: 2018-11-13 | Disposition: A | Discharge status: Home / Self Care | Payer: 59 | Source: Ambulatory Visit | Attending: Obstetrics and Gynecology | Admitting: Obstetrics and Gynecology

## 2018-11-13 DIAGNOSIS — Z1231 Encounter for screening mammogram for malignant neoplasm of breast: Secondary | ICD-10-CM

## 2018-12-05 ENCOUNTER — Encounter: Payer: Self-pay | Admitting: Family Medicine

## 2018-12-05 ENCOUNTER — Ambulatory Visit (INDEPENDENT_AMBULATORY_CARE_PROVIDER_SITE_OTHER): Payer: 59 | Admitting: Family Medicine

## 2018-12-05 ENCOUNTER — Other Ambulatory Visit: Payer: Self-pay

## 2018-12-05 DIAGNOSIS — F320 Major depressive disorder, single episode, mild: Secondary | ICD-10-CM | POA: Diagnosis not present

## 2018-12-05 DIAGNOSIS — F4321 Adjustment disorder with depressed mood: Secondary | ICD-10-CM

## 2018-12-05 DIAGNOSIS — F411 Generalized anxiety disorder: Secondary | ICD-10-CM | POA: Diagnosis not present

## 2018-12-05 DIAGNOSIS — M170 Bilateral primary osteoarthritis of knee: Secondary | ICD-10-CM

## 2018-12-05 DIAGNOSIS — E7800 Pure hypercholesterolemia, unspecified: Secondary | ICD-10-CM

## 2018-12-05 DIAGNOSIS — R739 Hyperglycemia, unspecified: Secondary | ICD-10-CM

## 2018-12-05 DIAGNOSIS — I10 Essential (primary) hypertension: Secondary | ICD-10-CM | POA: Diagnosis not present

## 2018-12-05 MED ORDER — TRAMADOL HCL 50 MG PO TABS: 50.0000 mg | ORAL_TABLET | Freq: Every day | ORAL | 2 refills | Status: DC | PRN

## 2018-12-05 MED ORDER — OZEMPIC (1 MG/DOSE) 2 MG/1.5ML ~~LOC~~ SOPN: 1.0000 mg | PEN_INJECTOR | SUBCUTANEOUS | 1 refills | Status: DC

## 2018-12-05 NOTE — Progress Notes (Signed)
Name: Taylor Hurst   MRN: [id number]    DOB: 10/24/1965   Date:12/05/2018       Progress Note  Subjective  Chief Complaint  Chief Complaint  Patient presents with  . Depression  . Hypertension  . Hyperlipidemia  . Anxiety  . Hyperglycemia    I connected with  Taylor Hurst  on 12/05/18 at 11:40 AM EST by telephone verified that I am speaking with the correct person using two identifiers.  I discussed the limitations of evaluation and management by telemedicine and the availability of in person appointments. The patient expressed understanding and agreed to proceed. Staff also discussed with the patient that there may be a patient responsible charge related to this service. Patient Location: at home Provider Location: [facility]   HPI  HTN: She deniesdizziness,chest pain, headachesor palpitation. She was not able to check bp today but usually normal at home   Obesity: she states she developed obesity after second child, went from 167 lbs to 225 lbs during pregnancy.Since Fall of 2019she changed her diet, and we added Ozempic, it has been curbing her appetite her weight is down 10 lbs in the last 6 months, but gained 2 lbs back since last visit. She states since her brother died, family and friends are dropping off comfort food to her mother's house   OA: she is taking Tylenolduring the day and tramadol qhs,she has been taking for many years. She had daily aching pain, worse at the end of work day,most of her pain is on her right knee, no recent effusion or erythema, no pain when she off work for a couple of days . 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 Her son is still home, but now grieving the loss of her younger brother that died in 11/14/2022, he had kidney failure    Shoulder pain: she has large breast, had surgery on right shoulder and is now having problems on the left side, she missed appointment with Ortho, She states decided not to go since symptoms have improved and is taking Tylenol arthritis for it    Patient Active Problem List   Diagnosis Date Noted  . Morbid (severe) obesity due to excess calories ([place]) 03/28/2017  . Hyperglycemia 12/22/2015  . Tendonitis, Achilles, right 01/11/2015  . Anxiety disorder 12/07/2014  . Complete rotator cuff tear 11/18/2014  . Hypercholesterolemia 07/30/2014  . Osteoarthritis of both knees 07/30/2014  . Chronic constipation 08/29/2011  . HTN (hypertension) 05/30/2011  . Insomnia 05/30/2011  . Obesity (BMI 30-39.9) 05/30/2011  . GERD (gastroesophageal reflux disease) 05/30/2011    Past Surgical History:  Procedure Laterality Date  . CHOLECYSTECTOMY  07/26/11  . INTRAOPERATIVE CHOLANGIOGRAM  07/26/2011   Procedure: INTRAOPERATIVE CHOLANGIOGRAM;  Surgeon: Adin Hector, MD;  Location: [facility];  Service: General;  Laterality: N/A;  . LIVER BIOPSY  07/26/2011   Procedure: LIVER BIOPSY;  Surgeon: Remo Lipps  C. Gross, MD;  Location: [facility];  Service: General;  Laterality: N/A;  core liver biopsy   . osteomilitis     right and left leg/surgery; right 1983; left 1988  . osteomylitis  [id number]   right leg, left leg  . SHOULDER OPEN ROTATOR CUFF REPAIR Right 11/18/2014   Procedure: RIGHT SHOULDER MINI OPEN ROTATOR CUFF REPAIR SUBACROMIAL DECOMPRESSION  ;  Surgeon: Jene EveryJeffrey Beane, MD;  Location: [facility];  Service: Orthopedics;  Laterality: Right;    Family History   Problem Relation Age of Onset  . COPD Mother   . Hypertension Mother   . Hyperlipidemia Mother   . Glaucoma Mother   . Alcohol abuse Father   . Cancer Maternal Aunt        breast  . Breast cancer Paternal Aunt   . Kidney failure Brother        He was waiting to get a kidney    Social History   Socioeconomic History  . Marital status: Married    Spouse name: Not on file  . Number of children: 2  . Years of education: Not on file  . Highest education level: Not on file  Occupational History  . Not on file  Social Needs  . Financial resource strain: Not hard at all  . Food insecurity    Worry: Never true    Inability: Never true  . Transportation needs    Medical: No    Non-medical: No  Tobacco Use  . Smoking status: Never Smoker  . Smokeless tobacco: Never Used  Substance and Sexual Activity  . Alcohol use: Yes    Alcohol/week: 0.0 standard drinks    Comment: occassionally  . Drug use: No  . Sexual activity: Yes    Birth control/protection: I.U.D.    Comment: Mirena  Lifestyle  . Physical activity    Days per week: 0 days    Minutes per session: 0 min  . Stress: Not at all  Relationships  . Social Musicianconnections    Talks on phone: Three times a week    Gets together: Three times a week    Attends religious service: Never    Active member of club or organization: No    Attends meetings of clubs or organizations: Never    Relationship status: Married  . Intimate partner violence    Fear of current or ex partner: No    Emotionally abused: No    Physically abused: No    Forced sexual activity: No  Other Topics Concern  . Not on file  Social History Narrative  . Not on file     Current Outpatient Medications:  Marland Kitchen.  Magnesium 500 MG TABS, , Disp: , Rfl:  .  amLODipine (NORVASC) 10 MG tablet, Take 1 tablet (10 mg total) by mouth daily., Disp: 90 tablet, Rfl: 1 .  aspirin EC 81 MG tablet, Take 1 tablet (81 mg total) by mouth daily., Disp: 30 tablet, Rfl: 0 .   b complex vitamins tablet, Take 1 tablet by mouth daily., Disp: , Rfl:  .  BIOTIN PO, Take 10,000 mg by mouth daily., Disp: , Rfl:  .  Black Cohosh 540 MG CAPS, Take 540 mg by mouth daily. , Disp: , Rfl:  .  diclofenac sodium (VOLTAREN) 1 % GEL, Apply 4 g topically 4 (four) times daily., Disp: 300 g, Rfl: 1 .  escitalopram (LEXAPRO) 10 MG tablet, Take 1 tablet (10 mg total) by mouth daily., Disp: 90 tablet, Rfl:  1 .  FIBER PO, Take 5 tablets by mouth 2 (two) times daily., Disp: , Rfl:  .  hydrOXYzine (ATARAX/VISTARIL) 10 MG tablet, Take 1 tablet (10 mg total) by mouth daily as needed., Disp: 90 tablet, Rfl: 1 .  ketotifen (ZADITOR) 0.025 % ophthalmic solution, 1 drop 2 (two) times daily., Disp: , Rfl:  .  levonorgestrel (MIRENA) 20 MCG/24HR IUD, 1 each by Intrauterine route once. , Disp: , Rfl:  .  Melatonin 5 MG CAPS, Take 10 mg by mouth at bedtime. , Disp: , Rfl:  .  Multiple Vitamin (MULTI-VITAMIN DAILY PO), Take 1 tablet by mouth daily with breakfast. Women's, Disp: , Rfl:  .  Omega-3 Fatty Acids (FISH OIL) 1000 MG CAPS, Take 1,000 mg by mouth 2 (two) times daily., Disp: , Rfl:  .  omeprazole (PRILOSEC) 40 MG capsule, , Disp: , Rfl:  .  Semaglutide,0.25 or 0.5MG /DOS, 2 MG/1.5ML SOPN, Inject 0.5 mg into the skin once a week., Disp: 9 mL, Rfl: 1 .  traMADol (ULTRAM) 50 MG tablet, Take 1 tablet (50 mg total) by mouth daily as needed. Fill 09-03 and monthly after that, Disp: 30 tablet, Rfl: 2 .  valsartan-hydrochlorothiazide (DIOVAN-HCT) 320-25 MG tablet, TAKE 1 TABLET DAILY, Disp: 90 tablet, Rfl: 1  Allergies  Allergen Reactions  . Percocet [Oxycodone-Acetaminophen] Anaphylaxis    bronchospasms  . Adhesive [Tape] Itching  . Allergy Relief  [Chlorpheniramine Maleate]     Heart flutters  . Betadine  [Povidone Iodine]     blisters blisters  . Other     Betadine=blisters skin  ,Cashew Nuts,sunflower seeds-swelling and itching  . Penicillins Hives    Has patient had a PCN reaction causing  immediate rash, facial/tongue/throat swelling, SOB or lightheadedness with hypotension: no Has patient had a PCN reaction causing severe rash involving mucus membranes or skin necrosis: no Has patient had a PCN reaction that required hospitalization happened in the hospital Has patient had a PCN reaction occurring within the last 10 years: no If all of the above answers are "NO", then may proceed with Cephalosporin use.     I personally reviewed active problem list, medication list, allergies, family history, social history, health maintenance with the patient/caregiver today.   ROS  Ten systems reviewed and is negative except as mentioned in HPI  Objective  Virtual encounter, vitals not obtained.  There is no height or weight on file to calculate BMI.  Physical Exam  Awake, alert and oriented  PHQ2/9: Depression screen [facility] 2/9 12/05/2018 08/29/2018 06/12/2018 06/12/2018 06/03/2018  Decreased Interest 0 1 0 0 0  Down, Depressed, Hopeless 3 1 0 0 0  PHQ - 2 Score 3 2 0 0 0  Altered sleeping 0 1 0 0 0  Tired, decreased energy 0 0 0 0 0  Change in appetite 2 1 0 0 0  Feeling bad or failure about yourself  0 0 0 0 0  Trouble concentrating 0 0 0 0 0  Moving slowly or fidgety/restless 0 0 0 0 0  Suicidal thoughts 0 0 0 0 0  PHQ-9 Score 5 4 0 0 0  Difficult doing work/chores Not difficult at all Not difficult at all Not difficult at all Not difficult at all Not difficult at all  Some recent data might be hidden   PHQ-2/9 Result is positive.    Fall Risk: Fall Risk  08/29/2018 06/12/2018 06/03/2018 05/16/2018 02/14/2018  Falls in the past year? 0 0 0 0 0  Number falls in past  yr: 0 0 0 0 0  Injury with Fall? 0 0 0 0 0  Follow up - Falls evaluation completed - - -     Assessment & Plan  1. Essential hypertension  Advised her to get a bp monitor for the house   2. Generalized anxiety disorder  Taking hydroxyzine prn   3. Hypercholesterolemia   4. Mild major depression  (HCC)  Stable but sad because she lost her brother recently   5. Grieving  Discussed hospice counseling   6. Hyperglycemia  She will try higher dose - Semaglutide, 1 MG/DOSE, (OZEMPIC, 1 MG/DOSE,) 2 MG/1.5ML SOPN; Inject 1 mg into the skin once a week.  Dispense: 9 mL; Refill: 1  7. Morbid (severe) obesity due to excess calories (HCC)  - Semaglutide, 1 MG/DOSE, (OZEMPIC, 1 MG/DOSE,) 2 MG/1.5ML SOPN; Inject 1 mg into the skin once a week.  Dispense: 9 mL; Refill: 1  8. Primary osteoarthritis of both knees  - traMADol (ULTRAM) 50 MG tablet; Take 1 tablet (50 mg total) by mouth daily as needed. Fill 01/01/2019  and monthly after that  Dispense: 30 tablet; Refill: 2  I discussed the assessment and treatment plan with the patient. The patient was provided an opportunity to ask questions and all were answered. The patient agreed with the plan and demonstrated an understanding of the instructions.  The patient was advised to call back or seek an in-person evaluation if the symptoms worsen or if the condition fails to improve as anticipated.  I provided 25 minutes of non-face-to-face time during this encounter.

## 2019-02-27 ENCOUNTER — Other Ambulatory Visit: Payer: Self-pay | Admitting: Family Medicine

## 2019-02-27 DIAGNOSIS — F325 Major depressive disorder, single episode, in full remission: Secondary | ICD-10-CM

## 2019-02-27 DIAGNOSIS — F411 Generalized anxiety disorder: Secondary | ICD-10-CM

## 2019-03-13 ENCOUNTER — Ambulatory Visit (INDEPENDENT_AMBULATORY_CARE_PROVIDER_SITE_OTHER): Payer: 59 | Admitting: Family Medicine

## 2019-03-13 ENCOUNTER — Other Ambulatory Visit: Payer: Self-pay

## 2019-03-13 ENCOUNTER — Encounter: Payer: Self-pay | Admitting: Family Medicine

## 2019-03-13 VITALS — BP 120/80 | HR 82 | Temp 97.7°F | Resp 16 | Ht 61.0 in | Wt 198.1 lb

## 2019-03-13 DIAGNOSIS — E7800 Pure hypercholesterolemia, unspecified: Secondary | ICD-10-CM | POA: Diagnosis not present

## 2019-03-13 DIAGNOSIS — F320 Major depressive disorder, single episode, mild: Secondary | ICD-10-CM

## 2019-03-13 DIAGNOSIS — I10 Essential (primary) hypertension: Secondary | ICD-10-CM

## 2019-03-13 DIAGNOSIS — F411 Generalized anxiety disorder: Secondary | ICD-10-CM | POA: Diagnosis not present

## 2019-03-13 DIAGNOSIS — M170 Bilateral primary osteoarthritis of knee: Secondary | ICD-10-CM | POA: Diagnosis not present

## 2019-03-13 MED ORDER — TRAMADOL HCL 50 MG PO TABS: 50.0000 mg | ORAL_TABLET | Freq: Every day | ORAL | 2 refills | Status: DC | PRN

## 2019-03-13 MED ORDER — ESCITALOPRAM OXALATE 10 MG PO TABS: 10.0000 mg | ORAL_TABLET | Freq: Every day | ORAL | 0 refills | Status: DC

## 2019-03-13 MED ORDER — HYDROXYZINE HCL 10 MG PO TABS: 10.0000 mg | ORAL_TABLET | Freq: Every day | ORAL | 1 refills | Status: DC | PRN

## 2019-03-13 NOTE — Progress Notes (Signed)
Name: Taylor Hurst   MRN: [id number]    DOB: [birth year]   Date:03/13/2019       Progress Note  Subjective  Chief Complaint  Chief Complaint  Patient presents with  . Hypertension  . Hyperglycemia  . Hyperlipidemia  . Depression    Brother passed away fluid on his heart while waiting on kidney transplant.  . Anxiety    HPI  HTN: She deniesdizziness,chest pain, headachesor palpitation.Taking medication daily, bp today is at goal   Obesity: she states she developed obesity after second child, went from 167 lbs to 225 lbs during pregnancy.Since Fall of 2019she changed her diet,and we added Ozempic, it has been curbing her appetite her weight is down another 6 lbs since last visit. She is back on her diet. She has been active at work She has a history of hyperglycemia  OA: she is taking Tylenolduring the day and tramadol qhs,she has been taking for many years. She had daily aching pain, worse at the end of work day,most of her pain is on her right knee or erythema. She states Tylenol Arthritis is keeping effusion down   GAD: sheis off alprazolam , under control withLexapro, also taking Atarax prn, mostly before bed  Sleep. She leaves work around 10 pm, goes home showers, plays some games and falls asleep around 1 am, she is waking up at 11 am. She states anxiety has been stable.   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  Major Depression Mild: Pha 9is slightly better, she is doing better on Lexapro Her step son is no longer at the house and is helping. Her son is still home, she lost her brother from complications of kidney failure but sates not sure about her grieving.   Shoulder pain: she has large breast,had surgery on right shoulder and is now having problems on the left side, she missed appointment with Ortho. Unchanged, she does not want to go  to Ortho at this time   Patient Active Problem List   Diagnosis Date Noted  . Morbid (severe) obesity due to excess calories (HCC) 03/28/2017  . Hyperglycemia 12/22/2015  . Tendonitis, Achilles, right 01/11/2015  . Anxiety disorder 12/07/2014  . Complete rotator cuff tear 11/18/2014  . Hypercholesterolemia 07/30/2014  . Osteoarthritis of both knees 07/30/2014  . Chronic constipation 08/29/2011  . HTN (hypertension) 05/30/2011  . Insomnia 05/30/2011  . Obesity (BMI 30-39.9) 05/30/2011  . GERD (gastroesophageal reflux disease) 05/30/2011    Past Surgical History:  Procedure Laterality Date  . CHOLECYSTECTOMY  07/26/11  . INTRAOPERATIVE CHOLANGIOGRAM  07/26/2011   Procedure: INTRAOPERATIVE CHOLANGIOGRAM;  Surgeon: Ardeth Sportsman, MD;  [facility];  Service: General;  Laterality: N/A;  . LIVER BIOPSY  07/26/2011   Procedure: LIVER BIOPSY;  Surgeon: Ardeth Sportsman, MD;  Location: [facility];  Service: General;  Laterality: N/A;  core liver biopsy   . osteomilitis     right and left leg/surgery; right 1983; left 1988  . osteomylitis  [id number]   right leg, left leg  . SHOULDER OPEN ROTATOR CUFF REPAIR Right 11/18/2014   Procedure: RIGHT SHOULDER MINI OPEN ROTATOR CUFF REPAIR SUBACROMIAL DECOMPRESSION  ;  Surgeon: Jene Every, MD;  Location: [facility];  Service: Orthopedics;  Laterality: Right;    Family History  Problem Relation Age of Onset  . COPD Mother   .  Hypertension Mother   . Hyperlipidemia Mother   . Glaucoma Mother   . Alcohol abuse Father   . Cancer Maternal Aunt        breast  . Breast cancer Paternal Aunt   . Kidney failure Brother        He was waiting to get a kidney    Social History   Tobacco Use  . Smoking status: Never Smoker  . Smokeless tobacco: Never Used  Substance Use Topics  . Alcohol use: Yes    Alcohol/week: 0.0 standard drinks    Comment: occassionally  . Drug use: No     Current Outpatient Medications:  .  amLODipine (NORVASC) 10 MG  tablet, Take 1 tablet (10 mg total) by mouth daily., Disp: 90 tablet, Rfl: 1 .  aspirin EC 81 MG tablet, Take 1 tablet (81 mg total) by mouth daily., Disp: 30 tablet, Rfl: 0 .  b complex vitamins tablet, Take 1 tablet by mouth daily., Disp: , Rfl:  .  BIOTIN PO, Take 10,000 mg by mouth daily., Disp: , Rfl:  .  Black Cohosh 540 MG CAPS, Take 540 mg by mouth daily. , Disp: , Rfl:  .  diclofenac sodium (VOLTAREN) 1 % GEL, Apply 4 g topically 4 (four) times daily., Disp: 300 g, Rfl: 1 .  escitalopram (LEXAPRO) 10 MG tablet, TAKE 1 TABLET DAILY, Disp: 90 tablet, Rfl: 0 .  escitalopram (LEXAPRO) 10 MG tablet, , Disp: , Rfl:  .  FIBER PO, Take 5 tablets by mouth 2 (two) times daily., Disp: , Rfl:  .  hydrOXYzine (ATARAX/VISTARIL) 10 MG tablet, Take 1 tablet (10 mg total) by mouth daily as needed., Disp: 90 tablet, Rfl: 1 .  ketotifen (ZADITOR) 0.025 % ophthalmic solution, 1 drop 2 (two) times daily., Disp: , Rfl:  .  levonorgestrel (MIRENA) 20 MCG/24HR IUD, 1 each by Intrauterine route once. , Disp: , Rfl:  .  Magnesium 500 MG TABS, , Disp: , Rfl:  .  Melatonin 5 MG CAPS, Take 10 mg by mouth at bedtime. , Disp: , Rfl:  .  Multiple Vitamin (MULTI-VITAMIN DAILY PO), Take 1 tablet by mouth daily with breakfast. Women's, Disp: , Rfl:  .  Omega-3 Fatty Acids (FISH OIL) 1000 MG CAPS, Take 1,000 mg by mouth 2 (two) times daily., Disp: , Rfl:  .  omeprazole (PRILOSEC) 40 MG capsule, , Disp: , Rfl:  .  Semaglutide, 1 MG/DOSE, (OZEMPIC, 1 MG/DOSE,) 2 MG/1.5ML SOPN, Inject 1 mg into the skin once a week., Disp: 9 mL, Rfl: 1 .  traMADol (ULTRAM) 50 MG tablet, Take 1 tablet (50 mg total) by mouth daily as needed. Fill 01/01/2019  and monthly after that, Disp: 30 tablet, Rfl: 2 .  valsartan-hydrochlorothiazide (DIOVAN-HCT) 320-25 MG tablet, TAKE 1 TABLET DAILY, Disp: 90 tablet, Rfl: 1  Allergies  Allergen Reactions  . Percocet [Oxycodone-Acetaminophen] Anaphylaxis    bronchospasms  . Adhesive [Tape] Itching  .  Allergy Relief  [Chlorpheniramine Maleate]     Heart flutters  . Betadine  [Povidone Iodine]     blisters blisters  . Other     Betadine=blisters skin  ,Cashew Nuts,sunflower seeds-swelling and itching  . Penicillins Hives    Has patient had a PCN reaction causing immediate rash, facial/tongue/throat swelling, SOB or lightheadedness with hypotension: no Has patient had a PCN reaction causing severe rash involving mucus membranes or skin necrosis: no Has patient had a PCN reaction that required hospitalization happened in the hospital Has patient had  a PCN reaction occurring within the last 10 years: no If all of the above answers are "NO", then may proceed with Cephalosporin use.     I personally reviewed active problem list, medication list, allergies, family history, social history, health maintenance with the patient/caregiver today.   ROS  Constitutional: Negative for fever or weight change.  Respiratory: Negative for cough and shortness of breath.   Cardiovascular: Negative for chest pain or palpitations.  Gastrointestinal: Negative for abdominal pain, no bowel changes.  Musculoskeletal: Negative for gait problem or joint swelling.  Skin: Negative for rash.  Neurological: Negative for dizziness or headache.  No other specific complaints in a complete review of systems (except as listed in HPI above).  Objective  Vitals:   03/13/19 1140  BP: 120/80  Pulse: 82  Resp: 16  Temp: 97.7 F (36.5 C)  TempSrc: Temporal  SpO2: 97%  Weight: 198 lb 1.6 oz (89.9 kg)  Height: 5\' 1"  (1.549 m)    Body mass index is 37.43 kg/m.  Physical Exam  Constitutional: Patient appears well-developed and well-nourished. Obese  No distress.  HEENT: head atraumatic, normocephalic, pupils equal and reactive to light Cardiovascular: Normal rate, regular rhythm and normal heart sounds.  No murmur heard. No BLE edema. Pulmonary/Chest: Effort normal and breath sounds normal. No respiratory  distress. Abdominal: Soft.  There is no tenderness. Psychiatric: Patient has a normal mood and affect. behavior is normal. Judgment and thought content normal.  PHQ2/9: Depression screen [facility] 2/9 03/13/2019 12/05/2018 08/29/2018 06/12/2018 06/12/2018  Decreased Interest 0 0 1 0 0  Down, Depressed, Hopeless 1 3 1  0 0  PHQ - 2 Score 1 3 2  0 0  Altered sleeping 1 0 1 0 0  Tired, decreased energy 0 0 0 0 0  Change in appetite 0 2 1 0 0  Feeling bad or failure about yourself  0 0 0 0 0  Trouble concentrating 1 0 0 0 0  Moving slowly or fidgety/restless 0 0 0 0 0  Suicidal thoughts 0 0 0 0 0  PHQ-9 Score 3 5 4  0 0  Difficult doing work/chores - Not difficult at all Not difficult at all Not difficult at all Not difficult at all  Some recent data might be hidden    phq 9 is negative   Fall Risk: Fall Risk  03/13/2019 08/29/2018 06/12/2018 06/03/2018 05/16/2018  Falls in the past year? 0 0 0 0 0  Number falls in past yr: 0 0 0 0 0  Injury with Fall? 0 0 0 0 0  Follow up - - Falls evaluation completed - -     Functional Status Survey: Is the patient deaf or have difficulty hearing?: No Does the patient have difficulty seeing, even when wearing glasses/contacts?: No Does the patient have difficulty concentrating, remembering, or making decisions?: No Does the patient have difficulty walking or climbing stairs?: No Does the patient have difficulty dressing or bathing?: No Does the patient have difficulty doing errands alone such as visiting a doctor's office or shopping?: No    Assessment & Plan  1. Primary osteoarthritis of both knees  - traMADol (ULTRAM) 50 MG tablet; Take 1 tablet (50 mg total) by mouth daily as needed. Fill 03/31/2019   and monthly after that  Dispense: 30 tablet; Refill: 2  2. Essential hypertension  At goal, continue norvasc   3. Generalized anxiety disorder  stable  - escitalopram (LEXAPRO) 10 MG tablet; Take 1 tablet (10 mg total) by mouth  daily.  Dispense:  90 tablet; Refill: 0 - hydrOXYzine (ATARAX/VISTARIL) 10 MG tablet; Take 1 tablet (10 mg total) by mouth daily as needed.  Dispense: 90 tablet; Refill: 1  4. Mild major depression (HCC)  - escitalopram (LEXAPRO) 10 MG tablet; Take 1 tablet (10 mg total) by mouth daily.  Dispense: 90 tablet; Refill: 0   5. Hypercholesterolemia   6. Morbid (severe) obesity due to excess calories [facility])  Discussed with the patient the risk posed by an increased BMI. Discussed importance of portion control, calorie counting and at least 150 minutes of physical activity weekly. Avoid sweet beverages and drink more water. Eat at least 6 servings of fruit and vegetables daily

## 2019-04-09 ENCOUNTER — Other Ambulatory Visit: Payer: Self-pay | Admitting: Family Medicine

## 2019-04-09 DIAGNOSIS — I10 Essential (primary) hypertension: Secondary | ICD-10-CM

## 2019-04-12 ENCOUNTER — Ambulatory Visit: Payer: 59 | Attending: Internal Medicine

## 2019-04-12 DIAGNOSIS — Z23 Encounter for immunization: Secondary | ICD-10-CM

## 2019-04-12 NOTE — Progress Notes (Signed)
   Covid-19 Vaccination Clinic  Name:  Taylor Hurst    MRN: [id number] DOB: 07-17-1965  04/12/2019  [name] was observed post Covid-19 immunization for 30 minutes without incident. She was provided with Vaccine Information Sheet and instruction to access the V-Safe system.   [name] was instructed to call 911 with any severe reactions post vaccine: Marland Kitchen Difficulty breathing  . Swelling of face and throat  . A fast heartbeat  . A bad rash all over body  . Dizziness and weakness   Immunizations Administered    Name Date Dose VIS Date Route   Pfizer COVID-19 Vaccine 04/12/2019  4:19 PM 0.3 mL 12/26/2018 Intramuscular   Manufacturer: [employer], [employer]   Lot: [id number]   NDC: [address]-6

## 2019-04-19 ENCOUNTER — Other Ambulatory Visit: Payer: Self-pay | Admitting: Family Medicine

## 2019-04-19 DIAGNOSIS — I10 Essential (primary) hypertension: Secondary | ICD-10-CM

## 2019-04-19 NOTE — Telephone Encounter (Signed)
Requested Prescriptions  Pending Prescriptions Disp Refills  . valsartan-hydrochlorothiazide (DIOVAN-HCT) 320-25 MG tablet [Pharmacy Med Name: VALSARTAN/HCTZ TABS 320/25MG ] 60 tablet 0    Sig: TAKE 1 TABLET DAILY     Cardiovascular: ARB + Diuretic Combos Failed - 04/19/2019  6:20 AM      Failed - K in normal range and within 180 days    Potassium  Date Value Ref Range Status  08/29/2018 3.4 (L) 3.5 - 5.3 mmol/L Final         Failed - Na in normal range and within 180 days    Sodium  Date Value Ref Range Status  08/29/2018 141 135 - 146 mmol/L Final  09/03/2014 144 134 - 144 mmol/L Final         Failed - Cr in normal range and within 180 days    Creat  Date Value Ref Range Status  08/29/2018 0.88 0.50 - 1.05 mg/dL Final    Comment:    For patients >50 years of age, the reference limit for Creatinine is approximately 13% higher for people identified as African-American. .          Failed - Ca in normal range and within 180 days    Calcium  Date Value Ref Range Status  08/29/2018 9.7 8.6 - 10.4 mg/dL Final         Passed - Patient is not pregnant      Passed - Last BP in normal range    BP Readings from Last 1 Encounters:  03/13/19 120/80         Passed - Valid encounter within last 6 months    Recent Outpatient Visits          1 month ago Hypercholesterolemia   [facility] Alba Cory, MD   4 months ago Essential hypertension   [facility] Alba Cory, MD   7 months ago Essential hypertension   [facility] Alba Cory, MD   10 months ago Allergic conjunctivitis of both eyes   [facility] Doren Custard, [place]   10 months ago Bacterial conjunctivitis   [facility] Doren Custard, [place]      Future Appointments            In 2 months Alba Cory, MD [facility]   In 5 months Hildred Laser, MD [facility]            Patient needs lab work per protocol at her next visit on 06/19/2019.  Refilled med until this appt.

## 2019-04-24 ENCOUNTER — Encounter: Payer: Self-pay | Admitting: Family Medicine

## 2019-04-24 ENCOUNTER — Other Ambulatory Visit: Payer: Self-pay | Admitting: Family Medicine

## 2019-04-24 DIAGNOSIS — I10 Essential (primary) hypertension: Secondary | ICD-10-CM

## 2019-04-24 MED ORDER — VALSARTAN-HYDROCHLOROTHIAZIDE 320-25 MG PO TABS: 1.0000 | ORAL_TABLET | Freq: Every day | ORAL | 1 refills | Status: DC

## 2019-05-08 ENCOUNTER — Ambulatory Visit: Payer: 59 | Attending: Internal Medicine

## 2019-05-08 DIAGNOSIS — Z23 Encounter for immunization: Secondary | ICD-10-CM

## 2019-05-08 NOTE — Progress Notes (Signed)
   Covid-19 Vaccination Clinic  Name:  Taylor Hurst    MRN: [id number] DOB: 27-Mar-1965  05/08/2019  Taylor Hurst was observed post Covid-19 immunization for 15 minutes without incident. She was provided with Vaccine Information Sheet and instruction to access the V-Safe system.   Taylor Hurst was instructed to call 911 with any severe reactions post vaccine: Marland Kitchen Difficulty breathing  . Swelling of face and throat  . A fast heartbeat  . A bad rash all over body  . Dizziness and weakness   Immunizations Administered    Name Date Dose VIS Date Route   Pfizer COVID-19 Vaccine 05/08/2019  3:11 PM 0.3 mL 03/11/2018 Intramuscular   Manufacturer: [employer], [employer]   Lot: [id number]   NDC: [address]-7

## 2019-06-05 ENCOUNTER — Other Ambulatory Visit: Payer: Self-pay

## 2019-06-05 ENCOUNTER — Ambulatory Visit (INDEPENDENT_AMBULATORY_CARE_PROVIDER_SITE_OTHER): Payer: Managed Care, Other (non HMO) | Admitting: Podiatry

## 2019-06-05 DIAGNOSIS — L0889 Other specified local infections of the skin and subcutaneous tissue: Secondary | ICD-10-CM | POA: Diagnosis not present

## 2019-06-05 DIAGNOSIS — L989 Disorder of the skin and subcutaneous tissue, unspecified: Secondary | ICD-10-CM

## 2019-06-05 MED ORDER — MUPIROCIN 2 % EX OINT: 1.0000 "application " | TOPICAL_OINTMENT | Freq: Two times a day (BID) | CUTANEOUS | 1 refills | Status: DC

## 2019-06-06 NOTE — Progress Notes (Signed)
   HPI: 54 y.o. adult presenting today as a new patient for evaluation of burning to the left plantar foot.  This is been ongoing for the past 4 years.  She denies injury.  Patient states that she has very symptomatic calluses to the plantar aspect of the foot.  Aggravated by walking and standing.  Patient works at [employer] for the past 21 years standing all day long.  Patient has tried having them treated at a nail salon where they shaved the calluses down.  She is also attempted OTC acid applications.  She presents today for further treatment evaluation  Past Medical History:  Diagnosis Date  . Achilles tendinitis 09/23/2014   Right leg   . Arthritis   . Chronic cholecystitis 05/30/2011  . Gastric ulcer   . GERD (gastroesophageal reflux disease)    hx of pt currently has no issues since gallbladder surgery   . Heart murmur   . Hepatitis    2013 chronic active hepatitis during gallbladder issues  . History of blood transfusion    [address]  . Hypertension   . Nausea   . Osteomyelitis (HCC)    1983 right leg;1988 left leg  . Torn rotator cuff 09/23/2014   Right side      Physical Exam: General: The patient is alert and oriented x3 in no acute distress.  Dermatology: Skin is warm, dry and supple bilateral lower extremities. Negative for open lesions or macerations.  Diffuse hyperkeratotic skin noted along the lateral plantar arch with multiple nucleated cores.  Appearance of the skin resembles a pitted keratolysis.  There is no strong malodor however  Vascular: Palpable pedal pulses bilaterally. No edema or erythema noted. Capillary refill within normal limits.  Neurological: Epicritic and protective threshold grossly intact bilaterally.   Musculoskeletal Exam: Range of motion within normal limits to all pedal and ankle joints bilateral. Muscle strength 5/5 in all groups bilateral.   Assessment: 1.  Pitted keratolysis   Plan of Care:  1. Patient evaluated. 2.  Light debridement of  the hyperkeratotic callus tissue was performed to the multiple nucleated cores throughout the foot using a chisel blade without incident or bleeding. 3.  Salicylic acid was applied to the area with a light dressing 4.  Recommend OTC corn and callus remover/salicylic acid daily 5.  Prescription for mupirocin 2% ointment to apply daily 6.  Return to clinic in 4 weeks  *Works at [employer] for 21 years      Felecia Shelling, DPM [facility]  Dr. Felecia Shelling, DPM    [address] [place], [place] [address]                Office [phone]  Fax [phone]

## 2019-06-19 ENCOUNTER — Other Ambulatory Visit: Payer: Self-pay

## 2019-06-19 ENCOUNTER — Encounter: Payer: Self-pay | Admitting: Family Medicine

## 2019-06-19 ENCOUNTER — Ambulatory Visit (INDEPENDENT_AMBULATORY_CARE_PROVIDER_SITE_OTHER): Payer: 59 | Admitting: Family Medicine

## 2019-06-19 DIAGNOSIS — I10 Essential (primary) hypertension: Secondary | ICD-10-CM

## 2019-06-19 DIAGNOSIS — G4726 Circadian rhythm sleep disorder, shift work type: Secondary | ICD-10-CM

## 2019-06-19 DIAGNOSIS — K219 Gastro-esophageal reflux disease without esophagitis: Secondary | ICD-10-CM

## 2019-06-19 DIAGNOSIS — F320 Major depressive disorder, single episode, mild: Secondary | ICD-10-CM | POA: Diagnosis not present

## 2019-06-19 DIAGNOSIS — M170 Bilateral primary osteoarthritis of knee: Secondary | ICD-10-CM | POA: Diagnosis not present

## 2019-06-19 DIAGNOSIS — F411 Generalized anxiety disorder: Secondary | ICD-10-CM

## 2019-06-19 DIAGNOSIS — E7800 Pure hypercholesterolemia, unspecified: Secondary | ICD-10-CM

## 2019-06-19 MED ORDER — TRAMADOL HCL 50 MG PO TABS: 50.0000 mg | ORAL_TABLET | Freq: Every day | ORAL | 2 refills | Status: DC | PRN

## 2019-06-19 MED ORDER — SAXENDA 18 MG/3ML ~~LOC~~ SOPN: 0.6000 mg | PEN_INJECTOR | Freq: Every day | SUBCUTANEOUS | 2 refills | Status: DC

## 2019-06-19 MED ORDER — ESCITALOPRAM OXALATE 10 MG PO TABS: 10.0000 mg | ORAL_TABLET | Freq: Every day | ORAL | 0 refills | Status: DC

## 2019-06-19 MED ORDER — TRAZODONE HCL 50 MG PO TABS: 25.0000 mg | ORAL_TABLET | Freq: Every evening | ORAL | 0 refills | Status: DC | PRN

## 2019-06-19 MED ORDER — INSULIN PEN NEEDLE 30G X 8 MM MISC: 1.0000 | 0 refills | Status: DC | PRN

## 2019-06-19 NOTE — Progress Notes (Signed)
Name: Taylor Hurst   MRN: [id number]    DOB: [birth year]   Date:06/19/2019       Progress Note  Subjective  Chief Complaint  Chief Complaint  Patient presents with  . Hypertension    3 month follow up, medication refills  . Depression  . Knee Pain    bilateral  follow up, medication refills    HPI  HTN: She deniesdizziness,chest pain, headachesor palpitation.Taking medication daily, bp today is at goal   Obesity: she states she developed obesity after second child, went from 167 lbs to 225 lbs during pregnancy.Since Fall of 2019she changed her diet,and we added Ozempic, it was  curbing her appetite her weight was improving but is up 2 lbs today, she states out of Ozempic because of cost. We will try changing to Saxenda   OA: she is taking Tylenolduring the day and tramadol qhs,she has been taking for many years. She had daily aching pain, worse at the end of work day,most of her pain is on her right knee, noticed some effusion yesterday but better today   GAD: sheis off alprazolam , under control withLexapro, also taking Atarax prn, mostly before bed  Sleep. She leaves work around 10:30  pm, goes home showers, plays some games and falls asleep around 1 am, she is waking up at 11 am. 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 She has been taking hydroxyzine to see if would help her sleep but does not work, also taking melatonin , we will add trazodone for sleep, she does not feel tired during the day and does not want provigil at this time   Major Depression Mild: Pha 9is stable,  she is doing better on Lexapro.  Her step son is no longer at the house and is helping.She states not longer spending all day in her room. She has good energy level, but not sleeping well.   Shoulder pain: she has large  breast,had surgery on right shoulder and is now having problems on the left side, she missed appointment with Ortho. Unchanged, she does not want to go to Ortho at this time. Same   Pitted keratolysis : seen by Dr. Clayburn Pert, had debridement, using topical medication and is doing better.    Patient Active Problem List   Diagnosis Date Noted  . Morbid (severe) obesity due to excess calories (HCC) 03/28/2017  . Hyperglycemia 12/22/2015  . Tendonitis, Achilles, right 01/11/2015  . Anxiety disorder 12/07/2014  . Complete rotator cuff tear 11/18/2014  . Hypercholesterolemia 07/30/2014  . Osteoarthritis of both knees 07/30/2014  . Chronic constipation 08/29/2011  . HTN (hypertension) 05/30/2011  . Insomnia 05/30/2011  . Obesity (BMI 30-39.9) 05/30/2011  . GERD (gastroesophageal reflux disease) 05/30/2011    Past Surgical History:  Procedure Laterality Date  . CHOLECYSTECTOMY  07/26/11  . INTRAOPERATIVE CHOLANGIOGRAM  07/26/2011   Procedure: INTRAOPERATIVE CHOLANGIOGRAM;  Surgeon: Ardeth Sportsman, MD;  Location: [facility];  Service: General;  Laterality: N/A;  . LIVER BIOPSY  07/26/2011   Procedure: LIVER BIOPSY;  Surgeon: Ardeth Sportsman, MD;  Location: [facility];  Service: General;  Laterality: N/A;  core liver biopsy   . osteomilitis     right and left leg/surgery; right 1983; left 1988  . osteomylitis  [id number]   right leg, left  leg  . SHOULDER OPEN ROTATOR CUFF REPAIR Right 11/18/2014   Procedure: RIGHT SHOULDER MINI OPEN ROTATOR CUFF REPAIR SUBACROMIAL DECOMPRESSION  ;  Surgeon: Jene Every, MD;  Location: [facility];  Service: Orthopedics;  Laterality: Right;    Family History  Problem Relation Age of Onset  . COPD Mother   . Hypertension Mother   . Hyperlipidemia Mother   . Glaucoma Mother   . Alcohol abuse Father   . Cancer Maternal Aunt        breast  . Breast cancer Paternal Aunt   . Kidney failure Brother        He was waiting to get a kidney    Social History   Tobacco Use   . Smoking status: Never Smoker  . Smokeless tobacco: Never Used  Substance Use Topics  . Alcohol use: Yes    Alcohol/week: 0.0 standard drinks    Comment: occassionally     Current Outpatient Medications:  .  amLODipine (NORVASC) 10 MG tablet, TAKE 1 TABLET DAILY, Disp: 90 tablet, Rfl: 1 .  aspirin EC 81 MG tablet, Take 1 tablet (81 mg total) by mouth daily., Disp: 30 tablet, Rfl: 0 .  b complex vitamins tablet, Take 1 tablet by mouth daily., Disp: , Rfl:  .  BIOTIN PO, Take 10,000 mg by mouth daily., Disp: , Rfl:  .  Black Cohosh 540 MG CAPS, Take 540 mg by mouth daily. , Disp: , Rfl:  .  diclofenac sodium (VOLTAREN) 1 % GEL, Apply 4 g topically 4 (four) times daily., Disp: 300 g, Rfl: 1 .  escitalopram (LEXAPRO) 10 MG tablet, Take 1 tablet (10 mg total) by mouth daily., Disp: 90 tablet, Rfl: 0 .  FIBER PO, Take 5 tablets by mouth 2 (two) times daily., Disp: , Rfl:  .  hydrOXYzine (ATARAX/VISTARIL) 10 MG tablet, Take 1 tablet (10 mg total) by mouth daily as needed., Disp: 90 tablet, Rfl: 1 .  ketotifen (ZADITOR) 0.025 % ophthalmic solution, 1 drop 2 (two) times daily., Disp: , Rfl:  .  levonorgestrel (MIRENA) 20 MCG/24HR IUD, 1 each by Intrauterine route once. , Disp: , Rfl:  .  Magnesium 500 MG TABS, , Disp: , Rfl:  .  Melatonin 5 MG CAPS, Take 10 mg by mouth at bedtime. , Disp: , Rfl:  .  Multiple Vitamin (MULTI-VITAMIN DAILY PO), Take 1 tablet by mouth daily with breakfast. Women's, Disp: , Rfl:  .  mupirocin ointment (BACTROBAN) 2 %, Place 1 application into the nose 2 (two) times daily., Disp: 30 g, Rfl: 1 .  Omega-3 Fatty Acids (FISH OIL) 1000 MG CAPS, Take 1,000 mg by mouth 2 (two) times daily., Disp: , Rfl:  .  omeprazole (PRILOSEC) 40 MG capsule, , Disp: , Rfl:  .  traMADol (ULTRAM) 50 MG tablet, Take 1 tablet (50 mg total) by mouth daily as needed. Fill 03/31/2019   and monthly after that, Disp: 30 tablet, Rfl: 2 .  valsartan-hydrochlorothiazide (DIOVAN-HCT) 320-25 MG tablet,  Take 1 tablet by mouth daily., Disp: 90 tablet, Rfl: 1 .  Semaglutide, 1 MG/DOSE, (OZEMPIC, 1 MG/DOSE,) 2 MG/1.5ML SOPN, Inject 1 mg into the skin once a week. (Patient not taking: Reported on 06/19/2019), Disp: 9 mL, Rfl: 1  Allergies  Allergen Reactions  . Percocet [Oxycodone-Acetaminophen] Anaphylaxis    bronchospasms  . Adhesive [Tape] Itching  . Allergy Relief  [Chlorpheniramine Maleate]     Heart flutters  . Betadine  [Povidone Iodine]     blisters blisters  .  Other     Betadine=blisters skin  ,Cashew Nuts,sunflower seeds-swelling and itching  . Penicillins Hives    Has patient had a PCN reaction causing immediate rash, facial/tongue/throat swelling, SOB or lightheadedness with hypotension: no Has patient had a PCN reaction causing severe rash involving mucus membranes or skin necrosis: no Has patient had a PCN reaction that required hospitalization happened in the hospital Has patient had a PCN reaction occurring within the last 10 years: no If all of the above answers are "NO", then may proceed with Cephalosporin use.     I personally reviewed active problem list, medication list, allergies, family history, social history, health maintenance with the patient/caregiver today.   ROS  Constitutional: Negative for fever or weight change.  Respiratory: Negative for cough and shortness of breath.   Cardiovascular: Negative for chest pain or palpitations.  Gastrointestinal: Negative for abdominal pain, no bowel changes.  Musculoskeletal: Negative for gait problem or joint swelling.  Skin: Negative for rash.  Skin piling from left foot  Neurological: Negative for dizziness or headache.  No other specific complaints in a complete review of systems (except as listed in HPI above).  Objective  Vitals:   06/19/19 1101  BP: 128/72  Pulse: 95  Resp: 16  Temp: 97.6 F (36.4 C)  TempSrc: Temporal  SpO2: 94%  Weight: 200 lb 3.2 oz (90.8 kg)  Height: 5\' 1"  (1.549 m)    Body  mass index is 37.83 kg/m.  Physical Exam  Constitutional: Patient appears well-developed and well-nourished. Obese  No distress.  HEENT: head atraumatic, normocephalic, pupils equal and reactive to light,  neck supple, throat within normal limits Cardiovascular: Normal rate, regular rhythm and normal heart sounds.  No murmur heard. No BLE edema. Pulmonary/Chest: Effort normal and breath sounds normal. No respiratory distress. Abdominal: Soft.  There is no tenderness. Muscular Skeletal: crepitus with extension of left knee, no effusion  Skin: trimmed the extra skin that was pilling from left foot  Psychiatric: Patient has a normal mood and affect. behavior is normal. Judgment and thought content normal.   PHQ2/9: Depression screen [facility] 2/9 06/19/2019 03/13/2019 12/05/2018 08/29/2018 06/12/2018  Decreased Interest 1 0 0 1 0  Down, Depressed, Hopeless 1 1 3 1  0  PHQ - 2 Score 2 1 3 2  0  Altered sleeping 0 1 0 1 0  Tired, decreased energy 0 0 0 0 0  Change in appetite 0 0 2 1 0  Feeling bad or failure about yourself  0 0 0 0 0  Trouble concentrating 0 1 0 0 0  Moving slowly or fidgety/restless 0 0 0 0 0  Suicidal thoughts 0 0 0 0 0  PHQ-9 Score 2 3 5 4  0  Difficult doing work/chores Not difficult at all - Not difficult at all Not difficult at all Not difficult at all  Some recent data might be hidden    phq 9 is positive   Fall Risk: Fall Risk  06/19/2019 03/13/2019 08/29/2018 06/12/2018 06/03/2018  Falls in the past year? 0 0 0 0 0  Number falls in past yr: 0 0 0 0 0  Injury with Fall? 0 0 0 0 0  Follow up Falls evaluation completed - - Falls evaluation completed -     Functional Status Survey: Is the patient deaf or have difficulty hearing?: No Does the patient have difficulty seeing, even when wearing glasses/contacts?: No Does the patient have difficulty concentrating, remembering, or making decisions?: No Does the patient have difficulty  walking or climbing stairs?: Yes Does the  patient have difficulty dressing or bathing?: No Does the patient have difficulty doing errands alone such as visiting a doctor's office or shopping?: No    Assessment & Plan  1. Essential hypertension  At goal   2. Mild major depression (HCC)  - escitalopram (LEXAPRO) 10 MG tablet; Take 1 tablet (10 mg total) by mouth daily.  Dispense: 90 tablet; Refill: 0  3. Primary osteoarthritis of both knees  - traMADol (ULTRAM) 50 MG tablet; Take 1 tablet (50 mg total) by mouth daily as needed. Fill 03/31/2019   and monthly after that  Dispense: 30 tablet; Refill: 2  4. Hypercholesterolemia   5. Morbid obesity (HCC)  BMI above 35 with co-morbidities such as GERD, OA, HTN and hyperlipidemia - Liraglutide -Weight Management (SAXENDA) 18 MG/3ML SOPN; Inject 0.1-0.5 mLs (0.6-3 mg total) into the skin daily.  Dispense: 15 mL; Refill: 2 - Insulin Pen Needle (NOVOFINE) 30G X 8 MM MISC; Inject 10 each into the skin as needed.  Dispense: 100 each; Refill: 0  6. Gastroesophageal reflux disease without esophagitis  Stable   7. Generalized anxiety disorder  - escitalopram (LEXAPRO) 10 MG tablet; Take 1 tablet (10 mg total) by mouth daily.  Dispense: 90 tablet; Refill: 0   8. Shift work sleep disorder  - traZODone (DESYREL) 50 MG tablet; Take 0.5-1 tablets (25-50 mg total) by mouth at bedtime as needed for sleep.  Dispense: 30 tablet; Refill: 0

## 2019-06-22 ENCOUNTER — Other Ambulatory Visit: Payer: Self-pay | Admitting: Family Medicine

## 2019-06-22 MED ORDER — OZEMPIC (1 MG/DOSE) 2 MG/1.5ML ~~LOC~~ SOPN: 1.0000 mg | PEN_INJECTOR | SUBCUTANEOUS | 2 refills | Status: DC

## 2019-07-10 ENCOUNTER — Encounter: Payer: Self-pay | Admitting: Podiatry

## 2019-07-10 ENCOUNTER — Ambulatory Visit (INDEPENDENT_AMBULATORY_CARE_PROVIDER_SITE_OTHER): Payer: Managed Care, Other (non HMO) | Admitting: Podiatry

## 2019-07-10 ENCOUNTER — Other Ambulatory Visit: Payer: Self-pay

## 2019-07-10 DIAGNOSIS — L989 Disorder of the skin and subcutaneous tissue, unspecified: Secondary | ICD-10-CM | POA: Diagnosis not present

## 2019-07-10 DIAGNOSIS — L0889 Other specified local infections of the skin and subcutaneous tissue: Secondary | ICD-10-CM | POA: Diagnosis not present

## 2019-07-10 MED ORDER — MUPIROCIN 2 % EX OINT: 1.0000 "application " | TOPICAL_OINTMENT | Freq: Two times a day (BID) | CUTANEOUS | 3 refills | Status: DC

## 2019-07-10 NOTE — Progress Notes (Signed)
   HPI: 54 y.o. adult presenting today for follow-up evaluation regarding pitted keratolysis to the left foot that has been present for the past 4 years.  Patient states that over the past month she has had amazing improvement.  She has been applying the mupirocin ointment and OTC salicylic acid to the foot daily.  She is very happy with the results and presents for further treatment evaluation  Past Medical History:  Diagnosis Date  . Achilles tendinitis 09/23/2014   Right leg   . Arthritis   . Chronic cholecystitis 05/30/2011  . Gastric ulcer   . GERD (gastroesophageal reflux disease)    hx of pt currently has no issues since gallbladder surgery   . Heart murmur   . Hepatitis    2013 chronic active hepatitis during gallbladder issues  . History of blood transfusion    19983  . Hypertension   . Nausea   . Osteomyelitis (HCC)    1983 right leg;1988 left leg  . Torn rotator cuff 09/23/2014   Right side      Physical Exam: General: The patient is alert and oriented x3 in no acute distress.  Dermatology: Skin is warm, dry and supple bilateral lower extremities. Negative for open lesions or macerations.  Diffuse hyperkeratotic skin noted along the lateral plantar arch with multiple nucleated cores.  Appearance of the skin resembles a pitted keratolysis.  There is no strong malodor however.  Compared to last visit there is significant improvement  Vascular: Palpable pedal pulses bilaterally. No edema or erythema noted. Capillary refill within normal limits.  Neurological: Epicritic and protective threshold grossly intact bilaterally.   Musculoskeletal Exam: Range of motion within normal limits to all pedal and ankle joints bilateral. Muscle strength 5/5 in all groups bilateral.   Assessment: 1.  Pitted keratolysis   Plan of Care:  1. Patient evaluated. 2.  Light debridement of the hyperkeratotic callus tissue was performed to the multiple nucleated cores throughout the foot using a  chisel blade without incident or bleeding. 3.  Continue salicylic acid daily until resolved 4.  Continue mupirocin 2% ointment daily 5.  Return to clinic as needed  *Works at [employer] for 21 years      Felecia Shelling, DPM [facility]  Dr. Felecia Shelling, DPM    [address] [place], [place] [address]                Office [phone]  Fax [phone]

## 2019-07-23 ENCOUNTER — Other Ambulatory Visit: Payer: Self-pay | Admitting: Family Medicine

## 2019-07-23 DIAGNOSIS — G4726 Circadian rhythm sleep disorder, shift work type: Secondary | ICD-10-CM

## 2019-08-20 ENCOUNTER — Other Ambulatory Visit: Payer: Self-pay | Admitting: Family Medicine

## 2019-08-20 DIAGNOSIS — F411 Generalized anxiety disorder: Secondary | ICD-10-CM

## 2019-08-21 ENCOUNTER — Telehealth: Payer: Self-pay | Admitting: Obstetrics and Gynecology

## 2019-08-21 NOTE — Telephone Encounter (Signed)
Patient called in to follow-up on her appointment change and patient stated that for his husbands insurance company that he would need a form filled out prior to the end of September to stay on his husbands insurance. Informed patient that I would send a message to his provider to see if we could accomodate since this was no fault of his own for having to push his physical out.  Could you please advise?

## 2019-08-25 NOTE — Telephone Encounter (Signed)
Patient called in stating that he called in last week and has yet to receive a response. I informed the patient that his provider and his providers nurse was out of the office on Friday and apologized if that wasn't told to him, I informed him that he should hear something back this week but if he hasn't hear anything by Thursday to give [place] another call. Patient stated he appreciated my help and that he would give [place] a call if he doesn't hear anything by Thursday.

## 2019-08-25 NOTE — Telephone Encounter (Signed)
Pt called no answer LM via VM that I was calling to return her call from Friday. Pt was informed VM that it was unclear on what form needed to be filed out and we would need more information. Pt was advised to please call the office as soon as possible to discuss the form needed.

## 2019-08-26 NOTE — Telephone Encounter (Signed)
Pt called no answer LM via VM to please call the office to speak more about the form that needs to be completed concerning her insurance.

## 2019-08-27 ENCOUNTER — Other Ambulatory Visit: Payer: Self-pay | Admitting: Family Medicine

## 2019-08-27 DIAGNOSIS — Z1231 Encounter for screening mammogram for malignant neoplasm of breast: Secondary | ICD-10-CM

## 2019-09-08 ENCOUNTER — Other Ambulatory Visit: Payer: Self-pay | Admitting: Family Medicine

## 2019-09-08 DIAGNOSIS — G4726 Circadian rhythm sleep disorder, shift work type: Secondary | ICD-10-CM

## 2019-09-15 ENCOUNTER — Other Ambulatory Visit: Payer: Self-pay | Admitting: Family Medicine

## 2019-09-15 DIAGNOSIS — I10 Essential (primary) hypertension: Secondary | ICD-10-CM

## 2019-09-15 NOTE — Telephone Encounter (Signed)
Requested Prescriptions  Pending Prescriptions Disp Refills  . amLODipine (NORVASC) 10 MG tablet [Pharmacy Med Name: AMLODIPINE BESYLATE TABS 10MG ] 90 tablet 3    Sig: TAKE 1 TABLET DAILY     Cardiovascular:  Calcium Channel Blockers Passed - 09/15/2019  1:16 AM      Passed - Last BP in normal range    BP Readings from Last 1 Encounters:  06/19/19 128/72         Passed - Valid encounter within last 6 months    Recent Outpatient Visits          2 months ago Morbid obesity [facility])   [facility], MD   6 months ago Hypercholesterolemia   [facility], MD   9 months ago Essential hypertension   [facility], MD   1 year ago Essential hypertension   [facility], MD   1 year ago Allergic conjunctivitis of both eyes   [facility], Doren Custard      Future Appointments            In 1 month [place], MD [facility]   In 2 months [facility], MD [facility]

## 2019-09-18 ENCOUNTER — Ambulatory Visit: Payer: 59 | Admitting: Family Medicine

## 2019-10-02 ENCOUNTER — Encounter: Payer: 59 | Admitting: Obstetrics and Gynecology

## 2019-10-02 ENCOUNTER — Ambulatory Visit: Payer: Managed Care, Other (non HMO) | Admitting: Podiatry

## 2019-10-16 ENCOUNTER — Encounter: Payer: Self-pay | Admitting: Podiatry

## 2019-10-16 ENCOUNTER — Other Ambulatory Visit: Payer: Self-pay

## 2019-10-16 ENCOUNTER — Ambulatory Visit (INDEPENDENT_AMBULATORY_CARE_PROVIDER_SITE_OTHER): Payer: Managed Care, Other (non HMO) | Admitting: Podiatry

## 2019-10-16 ENCOUNTER — Ambulatory Visit (INDEPENDENT_AMBULATORY_CARE_PROVIDER_SITE_OTHER): Payer: Managed Care, Other (non HMO)

## 2019-10-16 DIAGNOSIS — M205X1 Other deformities of toe(s) (acquired), right foot: Secondary | ICD-10-CM

## 2019-10-16 DIAGNOSIS — M7751 Other enthesopathy of right foot: Secondary | ICD-10-CM

## 2019-10-16 NOTE — Progress Notes (Signed)
   HPI: 54 y.o. adult presenting today for evaluation of sharp shooting pain to the right great toe that occurs intermittently.  She denies injury to the area.  She has been applying Voltaren gel with minimal relief.  This has been ongoing for approximately 2-3 months now.  She presents for further treatment evaluation  Past Medical History:  Diagnosis Date  . Achilles tendinitis 09/23/2014   Right leg   . Arthritis   . Chronic cholecystitis 05/30/2011  . Gastric ulcer   . GERD (gastroesophageal reflux disease)    hx of pt currently has no issues since gallbladder surgery   . Heart murmur   . Hepatitis    2013 chronic active hepatitis during gallbladder issues  . History of blood transfusion    19983  . Hypertension   . Nausea   . Osteomyelitis (HCC)    1983 right leg;1988 left leg  . Torn rotator cuff 09/23/2014   Right side      Physical Exam: General: The patient is alert and oriented x3 in no acute distress.  Dermatology: Skin is warm, dry and supple bilateral lower extremities. Negative for open lesions or macerations.  Vascular: Palpable pedal pulses bilaterally. No edema or erythema noted. Capillary refill within normal limits.  Neurological: Epicritic and protective threshold grossly intact bilaterally.   Musculoskeletal Exam: Range of motion within normal limits to all pedal and ankle joints bilateral. Muscle strength 5/5 in all groups bilateral.  There is some pain on palpation range of motion of the first MTPJ of the right foot  Radiographic Exam:  Normal osseous mineralization. Joint spaces preserved. No fracture/dislocation/boney destruction.    Assessment: 1.  First MTPJ capsulitis right   Plan of Care:  1. Patient evaluated. X-Rays reviewed.  2.  Injection of 0.5 cc Celestone Soluspan injected in the first MTPJ right foot 3.  Continue topical Voltaren gel daily 4.  Recommend wide fitting shoes that do not irritate the first MTPJ 5.  Return to clinic as  needed  *Works at [employer] for 21 years      Felecia Shelling, DPM [facility]  Dr. Felecia Shelling, DPM    [address] [place], [place] [address]                Office [phone]  Fax [phone]

## 2019-10-19 ENCOUNTER — Other Ambulatory Visit: Payer: Self-pay | Admitting: Podiatry

## 2019-10-19 DIAGNOSIS — M7751 Other enthesopathy of right foot: Secondary | ICD-10-CM
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
## 2019-10-29 ENCOUNTER — Encounter: Payer: Self-pay | Admitting: Family Medicine

## 2019-10-29 ENCOUNTER — Other Ambulatory Visit: Payer: Self-pay

## 2019-10-29 ENCOUNTER — Ambulatory Visit (INDEPENDENT_AMBULATORY_CARE_PROVIDER_SITE_OTHER): Payer: 59 | Admitting: Family Medicine

## 2019-10-29 VITALS — BP 126/72 | HR 68 | Temp 98.3°F | Resp 16 | Ht 61.0 in | Wt 194.6 lb

## 2019-10-29 DIAGNOSIS — F411 Generalized anxiety disorder: Secondary | ICD-10-CM | POA: Diagnosis not present

## 2019-10-29 DIAGNOSIS — I10 Essential (primary) hypertension: Secondary | ICD-10-CM | POA: Diagnosis not present

## 2019-10-29 DIAGNOSIS — F320 Major depressive disorder, single episode, mild: Secondary | ICD-10-CM

## 2019-10-29 DIAGNOSIS — Z131 Encounter for screening for diabetes mellitus: Secondary | ICD-10-CM

## 2019-10-29 DIAGNOSIS — Z113 Encounter for screening for infections with a predominantly sexual mode of transmission: Secondary | ICD-10-CM

## 2019-10-29 DIAGNOSIS — M170 Bilateral primary osteoarthritis of knee: Secondary | ICD-10-CM

## 2019-10-29 DIAGNOSIS — G4726 Circadian rhythm sleep disorder, shift work type: Secondary | ICD-10-CM

## 2019-10-29 DIAGNOSIS — Z23 Encounter for immunization: Secondary | ICD-10-CM | POA: Diagnosis not present

## 2019-10-29 DIAGNOSIS — E785 Hyperlipidemia, unspecified: Secondary | ICD-10-CM

## 2019-10-29 MED ORDER — ESCITALOPRAM OXALATE 10 MG PO TABS: 10.0000 mg | ORAL_TABLET | Freq: Every day | ORAL | 1 refills | Status: DC

## 2019-10-29 MED ORDER — TRAZODONE HCL 50 MG PO TABS: 50.0000 mg | ORAL_TABLET | Freq: Every day | ORAL | 1 refills | Status: DC

## 2019-10-29 MED ORDER — HYDROXYZINE HCL 10 MG PO TABS: 10.0000 mg | ORAL_TABLET | Freq: Every day | ORAL | 1 refills | Status: DC | PRN

## 2019-10-29 MED ORDER — VALSARTAN-HYDROCHLOROTHIAZIDE 320-25 MG PO TABS: 1.0000 | ORAL_TABLET | Freq: Every day | ORAL | 1 refills | Status: DC

## 2019-10-29 MED ORDER — TRAMADOL HCL 50 MG PO TABS: 50.0000 mg | ORAL_TABLET | Freq: Every day | ORAL | 0 refills | Status: DC | PRN

## 2019-10-30 ENCOUNTER — Ambulatory Visit: Payer: 59 | Admitting: Family Medicine

## 2019-10-30 LAB — CBC WITH DIFFERENTIAL/PLATELET
Absolute Monocytes: 482 cells/uL (ref 200–950)
Basophils Absolute: 58 cells/uL (ref 0–200)
Basophils Relative: 1.1 %
Eosinophils Absolute: 482 cells/uL (ref 15–500)
Eosinophils Relative: 9.1 %
HCT: 37.6 % (ref 35.0–45.0)
Hemoglobin: 12.5 g/dL (ref 11.7–15.5)
Lymphs Abs: 2470 cells/uL (ref 850–3900)
MCH: 30 pg (ref 27.0–33.0)
MCHC: 33.2 g/dL (ref 32.0–36.0)
MCV: 90.4 fL (ref 80.0–100.0)
MPV: 11.5 fL (ref 7.5–12.5)
Monocytes Relative: 9.1 %
Neutro Abs: 1807 cells/uL (ref 1500–7800)
Neutrophils Relative %: 34.1 %
Platelets: 299 10*3/uL (ref 140–400)
RBC: 4.16 10*6/uL (ref 3.80–5.10)
RDW: 12.4 % (ref 11.0–15.0)
Total Lymphocyte: 46.6 %
WBC: 5.3 10*3/uL (ref 3.8–10.8)

## 2019-10-30 LAB — COMPLETE METABOLIC PANEL WITH GFR
AG Ratio: 1.9 (calc) (ref 1.0–2.5)
ALT: 20 U/L (ref 6–29)
AST: 18 U/L (ref 10–35)
Albumin: 4.4 g/dL (ref 3.6–5.1)
Alkaline phosphatase (APISO): 56 U/L (ref 37–153)
BUN: 13 mg/dL (ref 7–25)
CO2: 32 mmol/L (ref 20–32)
Calcium: 9.7 mg/dL (ref 8.6–10.4)
Chloride: 100 mmol/L (ref 98–110)
Creat: 0.84 mg/dL (ref 0.50–1.05)
GFR, Est African American: 91 mL/min/{1.73_m2} (ref >=60)
GFR, Est Non African American: 79 mL/min/{1.73_m2} (ref >=60)
Globulin: 2.3 g/dL (calc) (ref 1.9–3.7)
Glucose, Bld: 92 mg/dL (ref 65–99)
Potassium: 3.4 mmol/L — ABNORMAL LOW (ref 3.5–5.3)
Sodium: 141 mmol/L (ref 135–146)
Total Bilirubin: 1 mg/dL (ref 0.2–1.2)
Total Protein: 6.7 g/dL (ref 6.1–8.1)

## 2019-10-30 LAB — LIPID PANEL
Cholesterol: 185 mg/dL (ref <200)
HDL: 62 mg/dL (ref >=50)
LDL Cholesterol (Calc): 105 mg/dL (calc) — ABNORMAL HIGH
Non-HDL Cholesterol (Calc): 123 mg/dL (calc) (ref <130)
Total CHOL/HDL Ratio: 3 (calc) (ref <5.0)
Triglycerides: 90 mg/dL (ref <150)

## 2019-10-30 LAB — RPR: RPR Ser Ql: NONREACTIVE

## 2019-10-30 LAB — HEMOGLOBIN A1C
Hgb A1c MFr Bld: 5.4 % of total Hgb (ref <5.7)
Mean Plasma Glucose: 108 (calc)
eAG (mmol/L): 6 (calc)

## 2019-10-30 LAB — HIV ANTIBODY (ROUTINE TESTING W REFLEX): HIV 1&2 Ab, 4th Generation: NONREACTIVE

## 2019-11-12 ENCOUNTER — Encounter: Payer: Self-pay | Admitting: Certified Nurse Midwife

## 2019-11-12 ENCOUNTER — Other Ambulatory Visit (HOSPITAL_COMMUNITY)
Admission: RE | Admit: 2019-11-12 | Discharge: 2019-11-12 | Disposition: A | Discharge status: Home / Self Care | Payer: 59 | Source: Ambulatory Visit | Attending: Certified Nurse Midwife | Admitting: Certified Nurse Midwife

## 2019-11-12 ENCOUNTER — Ambulatory Visit (INDEPENDENT_AMBULATORY_CARE_PROVIDER_SITE_OTHER): Payer: 59 | Admitting: Certified Nurse Midwife

## 2019-11-12 ENCOUNTER — Other Ambulatory Visit: Payer: Self-pay

## 2019-11-12 VITALS — BP 130/75 | HR 70 | Ht 61.0 in | Wt 198.4 lb

## 2019-11-12 DIAGNOSIS — Z124 Encounter for screening for malignant neoplasm of cervix: Secondary | ICD-10-CM

## 2019-11-12 DIAGNOSIS — Z975 Presence of (intrauterine) contraceptive device: Secondary | ICD-10-CM | POA: Diagnosis present

## 2019-11-12 DIAGNOSIS — Z01419 Encounter for gynecological examination (general) (routine) without abnormal findings: Secondary | ICD-10-CM | POA: Insufficient documentation

## 2019-11-12 DIAGNOSIS — Z1231 Encounter for screening mammogram for malignant neoplasm of breast: Secondary | ICD-10-CM

## 2019-11-12 NOTE — Progress Notes (Signed)
ANNUAL PREVENTATIVE CARE GYN  ENCOUNTER NOTE  Subjective:       Taylor Hurst is a 54 y.o. [phone] adult here for a routine annual gynecologic exam.  Current complaints: 1.  Needs forms completed for insurance 2.  Desires Pap smear  Denies difficulty breathing or respiratory distress, chest pain, abdominal pain, excessive vaginal bleeding, dysuria and leg pain or swelling.     Gynecologic History  No LMP recorded. (Menstrual status: IUD).  Contraception: IUD, Mirena; inserted 10/2014  Last Pap: 09/2016. Results were: Neg/Neg  Last mammogram: 10/2018. Results were: BI-RADS 1, scheduled 11/20/2019  Last colonoscopy: 2017. Results were: normal, next screening in 10 years  Obstetric History  OB History  Gravida Para Term Preterm AB Living  2 2 2     2   SAB TAB Ectopic Multiple Live Births          2    # Outcome Date GA Lbr Len/2nd Weight Sex Delivery Anes PTL Lv  2 Term 1986   7 lb 1 oz (3.204 kg) M Vag-Spont   LIV  1 Term 1983   7 lb 6 oz (3.345 kg) F Vag-Spont   LIV    Past Medical History:  Diagnosis Date  . Achilles tendinitis 09/23/2014   Right leg   . Arthritis   . Capsulitis of metatarsophalangeal (MTP) joint   . Chronic cholecystitis 05/30/2011  . Gastric ulcer   . GERD (gastroesophageal reflux disease)    hx of pt currently has no issues since gallbladder surgery   . Heart murmur   . Hepatitis    2013 chronic active hepatitis during gallbladder issues  . History of blood transfusion    19983  . Hypertension   . Nausea   . Osteomyelitis (HCC)    1983 right leg;1988 left leg  . Torn rotator cuff 09/23/2014   Right side     Past Surgical History:  Procedure Laterality Date  . CHOLECYSTECTOMY  07/26/11  . INTRAOPERATIVE CHOLANGIOGRAM  07/26/2011   Procedure: INTRAOPERATIVE CHOLANGIOGRAM;  Surgeon: Ardeth SportsmanSteven C. Gross, MD;  Location: [facility];  Service: General;  Laterality: N/A;  . LIVER BIOPSY  07/26/2011   Procedure: LIVER BIOPSY;  Surgeon: Ardeth SportsmanSteven C. Gross, MD;   Location: [facility];  Service: General;  Laterality: N/A;  core liver biopsy   . osteomilitis     right and left leg/surgery; right 1983; left 1988  . osteomylitis  [id number]   right leg, left leg  . SHOULDER OPEN ROTATOR CUFF REPAIR Right 11/18/2014   Procedure: RIGHT SHOULDER MINI OPEN ROTATOR CUFF REPAIR SUBACROMIAL DECOMPRESSION  ;  Surgeon: Jene EveryJeffrey Beane, MD;  Location: [facility];  Service: Orthopedics;  Laterality: Right;    Current Outpatient Medications on File Prior to Visit  Medication Sig Dispense Refill  . amLODipine (NORVASC) 10 MG tablet TAKE 1 TABLET DAILY 90 tablet 1  . aspirin EC 81 MG tablet Take 1 tablet (81 mg total) by mouth daily. 30 tablet 0  . b complex vitamins tablet Take 1 tablet by mouth daily.    Marland Kitchen. BIOTIN PO Take 10,000 mg by mouth daily.    . Black Cohosh 540 MG CAPS Take 540 mg by mouth daily.     . diclofenac sodium (VOLTAREN) 1 % GEL Apply 4 g topically 4 (four) times daily. 300 g 1  . escitalopram (LEXAPRO) 10 MG tablet Take 1 tablet (10 mg total) by mouth daily. 90 tablet 1  . FIBER PO Take 5 tablets by mouth 2 (  two) times daily.    . hydrOXYzine (ATARAX/VISTARIL) 10 MG tablet Take 1 tablet (10 mg total) by mouth daily as needed. 90 tablet 1  . ketotifen (ZADITOR) 0.025 % ophthalmic solution 1 drop 2 (two) times daily.    Marland Kitchen levonorgestrel (MIRENA) 20 MCG/24HR IUD 1 each by Intrauterine route once.     . Magnesium 500 MG TABS     . Melatonin 5 MG CAPS Take 10 mg by mouth at bedtime.     . Multiple Vitamin (MULTI-VITAMIN DAILY PO) Take 1 tablet by mouth daily with breakfast. Women's    . mupirocin ointment (BACTROBAN) 2 % Place 1 application into the nose 2 (two) times daily. 30 g 3  . Omega-3 Fatty Acids (FISH OIL) 1000 MG CAPS Take 1,000 mg by mouth 2 (two) times daily.    Marland Kitchen omeprazole (PRILOSEC) 40 MG capsule     . traMADol (ULTRAM) 50 MG tablet Take 1 tablet (50 mg total) by mouth daily as needed. To last 90 days 90 tablet 0  . traZODone (DESYREL) 50 MG  tablet Take 1 tablet (50 mg total) by mouth at bedtime. 90 tablet 1  . valsartan-hydrochlorothiazide (DIOVAN-HCT) 320-25 MG tablet Take 1 tablet by mouth daily. 90 tablet 1   No current facility-administered medications on file prior to visit.    Allergies  Allergen Reactions  . Percocet [Oxycodone-Acetaminophen] Anaphylaxis    bronchospasms  . Adhesive [Tape] Itching  . Allergy Relief  [Chlorpheniramine Maleate]     Heart flutters  . Betadine  [Povidone Iodine]     blisters blisters  . Other     Betadine=blisters skin  ,Cashew Nuts,sunflower seeds-swelling and itching  . Penicillins Hives    Has patient had a PCN reaction causing immediate rash, facial/tongue/throat swelling, SOB or lightheadedness with hypotension: no Has patient had a PCN reaction causing severe rash involving mucus membranes or skin necrosis: no Has patient had a PCN reaction that required hospitalization happened in the hospital Has patient had a PCN reaction occurring within the last 10 years: no If all of the above answers are "NO", then may proceed with Cephalosporin use.     Social History   Socioeconomic History  . Marital status: Married    Spouse name: Not on file  . Number of children: 2  . Years of education: Not on file  . Highest education level: Not on file  Occupational History  . Not on file  Tobacco Use  . Smoking status: Never Smoker  . Smokeless tobacco: Never Used  Vaping Use  . Vaping Use: Never used  Substance and Sexual Activity  . Alcohol use: Yes    Alcohol/week: 0.0 standard drinks    Comment: occassionally  . Drug use: No  . Sexual activity: Yes    Birth control/protection: I.U.D.    Comment: Mirena  Other Topics Concern  . Not on file  Social History Narrative  . Not on file   Social Determinants of Health   Financial Resource Strain:   . Difficulty of Paying Living Expenses: Not on file  Food Insecurity:   . Worried About Programme researcher, broadcasting/film/video in the Last Year:  Not on file  . Ran Out of Food in the Last Year: Not on file  Transportation Needs:   . Lack of Transportation (Medical): Not on file  . Lack of Transportation (Non-Medical): Not on file  Physical Activity:   . Days of Exercise per Week: Not on file  . Minutes of Exercise  per Session: Not on file  Stress:   . Feeling of Stress : Not on file  Social Connections:   . Frequency of Communication with Friends and Family: Not on file  . Frequency of Social Gatherings with Friends and Family: Not on file  . Attends Religious Services: Not on file  . Active Member of Clubs or Organizations: Not on file  . Attends Banker Meetings: Not on file  . Marital Status: Not on file  Intimate Partner Violence:   . Fear of Current or Ex-Partner: Not on file  . Emotionally Abused: Not on file  . Physically Abused: Not on file  . Sexually Abused: Not on file    Family History  Problem Relation Age of Onset  . COPD Mother   . Hypertension Mother   . Hyperlipidemia Mother   . Glaucoma Mother   . Alcohol abuse Father   . Lung cancer Father        spot on lung  . Cancer Maternal Aunt        breast  . Breast cancer Paternal Aunt   . Kidney failure Brother        He was waiting to get a kidney    The following portions of the patient's history were reviewed and updated as appropriate: allergies, current medications, past family history, past medical history, past social history, past surgical history and problem list.  Review of Systems  ROS negative except as noted above. Information obtained from patient.    Objective:   BP 130/75   Pulse 70   Ht 5\' 1"  (1.549 m)   Wt 198 lb 6.4 oz (90 kg)   BMI 37.49 kg/m    CONSTITUTIONAL: Well-developed, well-nourished female in no acute distress.   PSYCHIATRIC: Normal mood and affect. Normal behavior. Normal judgment and thought content.  NEUROLGIC: Alert and oriented to person, place, and time. Normal muscle tone coordination. No  cranial nerve deficit noted.  HENT:  Normocephalic, atraumatic, External right and left ear normal.   EYES: Conjunctivae and EOM are normal. Pupils are equal and round.   NECK: Normal range of motion, supple, no masses.  Normal thyroid.   SKIN: Skin is warm and dry. No rash noted. Not diaphoretic. No erythema. No pallor. Professional tattoos present.   CARDIOVASCULAR: Normal heart rate noted, regular rhythm, no murmur.  RESPIRATORY: Clear to auscultation bilaterally. Effort and breath sounds normal, no problems with respiration noted.  BREASTS: Symmetric in size. No masses, skin changes, nipple drainage, or lymphadenopathy.  ABDOMEN: Soft, normal bowel sounds, no distention noted.  No tenderness, rebound or guarding.   PELVIC:  External Genitalia: Normal  Vagina: Normal  Cervix: Normal, IUD strings present, Pap collected  Uterus: Normal  Adnexa: Normal  RV: External Exam NormaI   MUSCULOSKELETAL: Normal range of motion. No tenderness.  No cyanosis, clubbing, or edema.  2+ distal pulses.  LYMPHATIC: No Axillary, Supraclavicular, or Inguinal Adenopathy.  Assessment:   Annual gynecologic examination 54 y.o.   Contraception: IUD, Mirena   Obesity 2   Problem List Items Addressed This Visit    None    Visit Diagnoses    Well woman exam    -  Primary   Relevant Orders   Cytology - PAP   IUD (intrauterine device) in place       Relevant Orders   Cytology - PAP   Breast cancer screening by mammogram       Screening for cervical cancer  Relevant Orders   Cytology - PAP      Plan:   Pap: Pap Co Test  Mammogram: Ordered   Stool Guaiac Testing:  Not Indicated; next colonoscopy due in 6 years  Labs: Managed by PCP  Routine preventative health maintenance measures emphasized: Exercise/Diet/Weight control, Tobacco Warnings, Alcohol/Substance use risks and Stress Management; see AVS  Reviewed red flag symptoms and when to call  Return to Clinic - 1 Year for  [facility] or sooner if needed   Serafina Royals, CNM  [facility], [facility] 11/12/19 11:29 AM

## 2019-11-12 NOTE — Patient Instructions (Signed)

## 2019-11-18 LAB — CYTOLOGY - PAP
Comment: NEGATIVE
Diagnosis: NEGATIVE
High risk HPV: NEGATIVE

## 2019-11-20 ENCOUNTER — Other Ambulatory Visit: Payer: Self-pay

## 2019-11-20 ENCOUNTER — Ambulatory Visit
Admission: RE | Admit: 2019-11-20 | Discharge: 2019-11-20 | Disposition: A | Discharge status: Home / Self Care | Payer: 59 | Source: Ambulatory Visit | Attending: Family Medicine | Admitting: Family Medicine

## 2019-11-20 DIAGNOSIS — Z1231 Encounter for screening mammogram for malignant neoplasm of breast: Secondary | ICD-10-CM

## 2019-12-04 ENCOUNTER — Encounter: Payer: 59 | Admitting: Obstetrics and Gynecology

## 2020-01-01 ENCOUNTER — Ambulatory Visit
Admission: RE | Admit: 2020-01-01 | Discharge: 2020-01-01 | Disposition: A | Discharge status: Home / Self Care | Payer: 59 | Source: Ambulatory Visit | Attending: Certified Nurse Midwife | Admitting: Certified Nurse Midwife

## 2020-01-01 ENCOUNTER — Other Ambulatory Visit: Payer: Self-pay

## 2020-01-01 DIAGNOSIS — Z1231 Encounter for screening mammogram for malignant neoplasm of breast: Secondary | ICD-10-CM

## 2020-01-01 DIAGNOSIS — Z01419 Encounter for gynecological examination (general) (routine) without abnormal findings: Secondary | ICD-10-CM

## 2020-01-19 ENCOUNTER — Encounter: Payer: Self-pay | Admitting: Family Medicine

## 2020-02-03 ENCOUNTER — Encounter: Payer: 59 | Admitting: Obstetrics and Gynecology

## 2020-02-05 ENCOUNTER — Ambulatory Visit: Payer: 59 | Admitting: Family Medicine

## 2020-02-12 ENCOUNTER — Ambulatory Visit: Payer: 59 | Admitting: Family Medicine

## 2020-03-14 ENCOUNTER — Other Ambulatory Visit: Payer: Self-pay | Admitting: Family Medicine

## 2020-03-14 DIAGNOSIS — I10 Essential (primary) hypertension: Secondary | ICD-10-CM

## 2020-03-25 ENCOUNTER — Other Ambulatory Visit: Payer: Self-pay

## 2020-03-25 ENCOUNTER — Encounter: Payer: Self-pay | Admitting: Family Medicine

## 2020-03-25 DIAGNOSIS — M170 Bilateral primary osteoarthritis of knee: Secondary | ICD-10-CM

## 2020-03-28 ENCOUNTER — Other Ambulatory Visit: Payer: Self-pay | Admitting: Family Medicine

## 2020-03-28 DIAGNOSIS — M170 Bilateral primary osteoarthritis of knee: Secondary | ICD-10-CM

## 2020-03-28 MED ORDER — TRAMADOL HCL 50 MG PO TABS: 50.0000 mg | ORAL_TABLET | Freq: Every day | ORAL | 0 refills | Status: DC | PRN

## 2020-04-07 NOTE — Progress Notes (Signed)
Name: Taylor Hurst   MRN: [id number]    DOB: 07-11-1965   Date:04/08/2020       Progress Note  Subjective  Chief Complaint  Follow Up  HPI  HTN: She deniesdizziness,chest pain, headachesor palpitation.Compliant with medications. BP is at goal   Obesity: she states she developed obesity after second child, went from 167 lbs to 225 lbs during pregnancy.Since Fall of 2019she changed her diet,and we added Ozempic, it was  curbing her appetite her weight was improving , no longer medication because of cost, Saxenda not covered, she lost her father since her last visit and she gained weight since last visit   OA: she is taking Tylenolduring the day and tramadol qhs,she has been taking for many years. She continues to have daily aching pain, seen by Ortho had steroid injections in Nov 2021, has bone spurs, states worse when she gets home from work.   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 Medication is helping her stay and fall asleep but feels tired during  the day.  Major Depression Mild: Pha 9is up, she has been feeling more snappy lately and is trying to avoid being around people.  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 Abnormal liver function tests 04/08/2020  . Colon cancer screening 04/08/2020  . Cholelithiasis 04/08/2020  . Left upper quadrant pain 04/08/2020  . Esophagitis 04/08/2020  . Right upper quadrant pain 04/08/2020  . Gastric ulcer 04/08/2020  . Other esophagitis without bleeding 04/08/2020  . Morbid (severe) obesity due to excess calories (HCC) 03/28/2017  . Hyperglycemia 12/22/2015  . Tendonitis, Achilles,  right 01/11/2015  . Anxiety disorder 12/07/2014  . Complete rotator cuff tear 11/18/2014  . Hypercholesterolemia 07/30/2014  . Osteoarthritis of both knees 07/30/2014  . Chronic constipation 08/29/2011  . HTN (hypertension) 05/30/2011  . Insomnia 05/30/2011  . Obesity (BMI 30-39.9) 05/30/2011  . GERD (gastroesophageal reflux disease) 05/30/2011    Past Surgical History:  Procedure Laterality Date  . CHOLECYSTECTOMY  07/26/11  . INTRAOPERATIVE CHOLANGIOGRAM  07/26/2011   Procedure: INTRAOPERATIVE CHOLANGIOGRAM;  Surgeon: Ardeth Sportsman, MD;  Location: [facility];  Service: General;  Laterality: N/A;  . LIVER BIOPSY  07/26/2011   Procedure: LIVER BIOPSY;  Surgeon: Ardeth Sportsman, MD;  Location: [facility];  Service: General;  Laterality: N/A;  core liver biopsy   . osteomilitis     right and left leg/surgery; right 1983; left 1988  . osteomylitis  [id number]   right leg, left leg  . SHOULDER OPEN ROTATOR CUFF REPAIR Right 11/18/2014   Procedure: RIGHT SHOULDER MINI OPEN ROTATOR CUFF REPAIR SUBACROMIAL DECOMPRESSION  ;  Surgeon: Jene Every, MD;  Location: [facility];  Service: Orthopedics;  Laterality: Right;    Family History  Problem Relation Age of Onset  . COPD Mother   . Hypertension Mother   . Hyperlipidemia Mother   . Glaucoma Mother   . Alcohol abuse Father   . Colon cancer Father   . Liver cancer Father   . Cancer Maternal Aunt        breast  . Breast cancer Paternal Aunt   .  Kidney failure Brother        He was waiting to get a kidney    Social History   Tobacco Use  . Smoking status: Never Smoker  . Smokeless tobacco: Never Used  Substance Use Topics  . Alcohol use: Yes    Alcohol/week: 0.0 standard drinks    Comment: occassionally     Current Outpatient Medications:  .  amLODipine (NORVASC) 10 MG tablet, TAKE 1 TABLET DAILY, Disp: 90 tablet, Rfl: 1 .  ARIPiprazole (ABILIFY) 2 MG tablet, Take 1 tablet (2 mg total) by mouth every evening., Disp: 90 tablet, Rfl:  1 .  aspirin EC 81 MG tablet, Take 1 tablet (81 mg total) by mouth daily., Disp: 30 tablet, Rfl: 0 .  b complex vitamins tablet, Take 1 tablet by mouth daily., Disp: , Rfl:  .  BIOTIN PO, Take 10,000 mg by mouth daily., Disp: , Rfl:  .  Black Cohosh 540 MG CAPS, Take 540 mg by mouth daily. , Disp: , Rfl:  .  diclofenac sodium (VOLTAREN) 1 % GEL, Apply 4 g topically 4 (four) times daily., Disp: 300 g, Rfl: 1 .  FIBER PO, Take 5 tablets by mouth 2 (two) times daily., Disp: , Rfl:  .  ketotifen (ZADITOR) 0.025 % ophthalmic solution, 1 drop 2 (two) times daily., Disp: , Rfl:  .  levonorgestrel (MIRENA) 20 MCG/24HR IUD, 1 each by Intrauterine route once. , Disp: , Rfl:  .  Magnesium 500 MG TABS, , Disp: , Rfl:  .  Multiple Vitamin (MULTI-VITAMIN DAILY PO), Take 1 tablet by mouth daily with breakfast. Women's, Disp: , Rfl:  .  mupirocin ointment (BACTROBAN) 2 %, Place 1 application into the nose 2 (two) times daily., Disp: 30 g, Rfl: 3 .  Omega-3 Fatty Acids (FISH OIL) 1000 MG CAPS, Take 1,000 mg by mouth 2 (two) times daily., Disp: , Rfl:  .  omeprazole (PRILOSEC) 40 MG capsule, , Disp: , Rfl:  .  escitalopram (LEXAPRO) 10 MG tablet, Take 1 tablet (10 mg total) by mouth daily., Disp: 90 tablet, Rfl: 1 .  hydrOXYzine (ATARAX/VISTARIL) 10 MG tablet, Take 1 tablet (10 mg total) by mouth daily as needed., Disp: 90 tablet, Rfl: 1 .  traMADol (ULTRAM) 50 MG tablet, Take 1 tablet (50 mg total) by mouth daily as needed. To last 90 days, Disp: 90 tablet, Rfl: 0 .  traZODone (DESYREL) 50 MG tablet, Take 1 tablet (50 mg total) by mouth at bedtime., Disp: 90 tablet, Rfl: 1 .  valsartan-hydrochlorothiazide (DIOVAN-HCT) 320-25 MG tablet, Take 1 tablet by mouth daily., Disp: 90 tablet, Rfl: 1  Allergies  Allergen Reactions  . Percocet [Oxycodone-Acetaminophen] Anaphylaxis    bronchospasms  . Adhesive [Tape] Itching  . Allergy Relief  [Chlorpheniramine Maleate]     Heart flutters  . Other      Betadine=blisters skin  ,Cashew Nuts,sunflower seeds-swelling and itching  . Oxycodone-Acetaminophen     Other reaction(s): Unknown  . Penicillin G     Other reaction(s): Unknown  . Penicillins Hives    Has patient had a PCN reaction causing immediate rash, facial/tongue/throat swelling, SOB or lightheadedness with hypotension: no Has patient had a PCN reaction causing severe rash involving mucus membranes or skin necrosis: no Has patient had a PCN reaction that required hospitalization happened in the hospital Has patient had a PCN reaction occurring within the last 10 years: no If all of the above answers are "NO", then may proceed with Cephalosporin use.   .Marland Kitchen  Povidone Iodine     blisters blisters Other reaction(s): Unknown  . Diphenhydramine Rash    I personally reviewed active problem list, medication list, allergies, family history, social history, health maintenance with the patient/caregiver today.   ROS  Constitutional: Negative for fever, positive for  weight change.  Respiratory: Negative for cough and shortness of breath.   Cardiovascular: Negative for chest pain or palpitations.  Gastrointestinal: Negative for abdominal pain, no bowel changes.  Musculoskeletal: Negative for gait problem and  joint swelling.  Skin: Negative for rash.  Neurological: Negative for dizziness or headache.  No other specific complaints in a complete review of systems (except as listed in HPI above).  Objective  Vitals:   04/08/20 1115  BP: 128/74  Pulse: 86  Resp: 16  Temp: 98 F (36.7 C)  TempSrc: Oral  SpO2: 98%  Weight: 208 lb (94.3 kg)  Height: 5\' 1"  (1.549 m)    Body mass index is 39.3 kg/m.  Physical Exam  Constitutional: Patient appears well-developed and well-nourished. Obese  No distress.  HEENT: head atraumatic, normocephalic, pupils equal and reactive to light, , neck supple Cardiovascular: Normal rate, regular rhythm and normal heart sounds.  No murmur heard. No  BLE edema. Pulmonary/Chest: Effort normal and breath sounds normal. No respiratory distress. Abdominal: Soft.  There is no tenderness. Muscular Skeletal: crepitus with extension of both knees  Psychiatric: Patient has a normal mood and affect. behavior is normal. Judgment and thought content normal.  PHQ2/9: Depression screen [facility] 2/9 04/08/2020 10/29/2019 06/19/2019 03/13/2019 12/05/2018  Decreased Interest 1 1 1  0 0  Down, Depressed, Hopeless 2 1 1 1 3   PHQ - 2 Score 3 2 2 1 3   Altered sleeping 1 0 0 1 0  Tired, decreased energy 0 0 0 0 0  Change in appetite 2 1 0 0 2  Feeling bad or failure about yourself  2 0 0 0 0  Trouble concentrating 0 0 0 1 0  Moving slowly or fidgety/restless 0 0 0 0 0  Suicidal thoughts 0 0 0 0 0  PHQ-9 Score 8 3 2 3 5   Difficult doing work/chores Somewhat difficult Not difficult at all Not difficult at all - Not difficult at all  Some recent data might be hidden    phq 9 is positive  Fall Risk: Fall Risk  04/08/2020 10/29/2019 06/19/2019 03/13/2019 08/29/2018  Falls in the past year? 0 0 0 0 0  Number falls in past yr: 0 0 0 0 0  Injury with Fall? - 0 0 0 0  Follow up Falls prevention discussed - Falls evaluation completed - -     Functional Status Survey: Is the patient deaf or have difficulty hearing?: No Does the patient have difficulty seeing, even when wearing glasses/contacts?: No Does the patient have difficulty concentrating, remembering, or making decisions?: No Does the patient have difficulty walking or climbing stairs?: Yes Does the patient have difficulty dressing or bathing?: No Does the patient have difficulty doing errands alone such as visiting a doctor's office or shopping?: No    Assessment & Plan  1. Essential hypertension  - valsartan-hydrochlorothiazide (DIOVAN-HCT) 320-25 MG tablet; Take 1 tablet by mouth daily.  Dispense: 90 tablet; Refill: 1  2. Mild major depression (HCC)  - escitalopram (LEXAPRO) 10 MG tablet; Take 1  tablet (10 mg total) by mouth daily.  Dispense: 90 tablet; Refill: 1 - ARIPiprazole (ABILIFY) 2 MG tablet; Take 1 tablet (2 mg total) by mouth every evening.  Dispense: 90 tablet; Refill: 1  3. Dyslipidemia   4. Shift work sleep disorder  - traZODone (DESYREL) 50 MG tablet; Take 1 tablet (50 mg total) by mouth at bedtime.  Dispense: 90 tablet; Refill: 1  5. Diabetes mellitus screening   6. Morbid obesity (HCC)  Discussed with the patient the risk posed by an increased BMI. Discussed importance of portion control, calorie counting and at least 150 minutes of physical activity weekly. Avoid sweet beverages and drink more water. Eat at least 6 servings of fruit and vegetables daily   7. Hypercholesterolemia  The 10-year ASCVD risk score Denman George DC Montez Hageman., et al., 2013) is: 4.1%   Values used to calculate the score:     Age: 89 years     Sex: Female     Is Non-Hispanic African American: Yes     Diabetic: No     Tobacco smoker: No     Systolic Blood Pressure: 128 mmHg     Is BP treated: Yes     HDL Cholesterol: 62 mg/dL     Total Cholesterol: 185 mg/dL   8. Primary osteoarthritis of both knees  - traMADol (ULTRAM) 50 MG tablet; Take 1 tablet (50 mg total) by mouth daily as needed. To last 90 days  Dispense: 90 tablet; Refill: 0  9. Gastroesophageal reflux disease without esophagitis   10. Generalized anxiety disorder  - escitalopram (LEXAPRO) 10 MG tablet; Take 1 tablet (10 mg total) by mouth daily.  Dispense: 90 tablet; Refill: 1 - hydrOXYzine (ATARAX/VISTARIL) 10 MG tablet; Take 1 tablet (10 mg total) by mouth daily as needed.  Dispense: 90 tablet; Refill: 1

## 2020-04-08 ENCOUNTER — Ambulatory Visit (INDEPENDENT_AMBULATORY_CARE_PROVIDER_SITE_OTHER): Payer: 59 | Admitting: Family Medicine

## 2020-04-08 ENCOUNTER — Encounter: Payer: Self-pay | Admitting: Family Medicine

## 2020-04-08 ENCOUNTER — Other Ambulatory Visit: Payer: Self-pay

## 2020-04-08 VITALS — BP 128/74 | HR 86 | Temp 98.0°F | Resp 16 | Ht 61.0 in | Wt 208.0 lb

## 2020-04-08 DIAGNOSIS — F411 Generalized anxiety disorder: Secondary | ICD-10-CM

## 2020-04-08 DIAGNOSIS — K219 Gastro-esophageal reflux disease without esophagitis: Secondary | ICD-10-CM

## 2020-04-08 DIAGNOSIS — F320 Major depressive disorder, single episode, mild: Secondary | ICD-10-CM

## 2020-04-08 DIAGNOSIS — I10 Essential (primary) hypertension: Secondary | ICD-10-CM

## 2020-04-08 DIAGNOSIS — Z1211 Encounter for screening for malignant neoplasm of colon: Secondary | ICD-10-CM | POA: Insufficient documentation

## 2020-04-08 DIAGNOSIS — G4726 Circadian rhythm sleep disorder, shift work type: Secondary | ICD-10-CM | POA: Diagnosis not present

## 2020-04-08 DIAGNOSIS — R1012 Left upper quadrant pain: Secondary | ICD-10-CM | POA: Insufficient documentation

## 2020-04-08 DIAGNOSIS — K209 Esophagitis, unspecified without bleeding: Secondary | ICD-10-CM | POA: Insufficient documentation

## 2020-04-08 DIAGNOSIS — R1011 Right upper quadrant pain: Secondary | ICD-10-CM | POA: Insufficient documentation

## 2020-04-08 DIAGNOSIS — M170 Bilateral primary osteoarthritis of knee: Secondary | ICD-10-CM

## 2020-04-08 DIAGNOSIS — K8020 Calculus of gallbladder without cholecystitis without obstruction: Secondary | ICD-10-CM | POA: Insufficient documentation

## 2020-04-08 DIAGNOSIS — E785 Hyperlipidemia, unspecified: Secondary | ICD-10-CM

## 2020-04-08 DIAGNOSIS — Z131 Encounter for screening for diabetes mellitus: Secondary | ICD-10-CM

## 2020-04-08 DIAGNOSIS — K259 Gastric ulcer, unspecified as acute or chronic, without hemorrhage or perforation: Secondary | ICD-10-CM | POA: Insufficient documentation

## 2020-04-08 DIAGNOSIS — R7989 Other specified abnormal findings of blood chemistry: Secondary | ICD-10-CM | POA: Insufficient documentation

## 2020-04-08 DIAGNOSIS — R945 Abnormal results of liver function studies: Secondary | ICD-10-CM | POA: Insufficient documentation

## 2020-04-08 DIAGNOSIS — E7800 Pure hypercholesterolemia, unspecified: Secondary | ICD-10-CM

## 2020-04-08 DIAGNOSIS — K208 Other esophagitis without bleeding: Secondary | ICD-10-CM | POA: Insufficient documentation

## 2020-04-08 MED ORDER — ESCITALOPRAM OXALATE 10 MG PO TABS: 10.0000 mg | ORAL_TABLET | Freq: Every day | ORAL | 1 refills | Status: DC

## 2020-04-08 MED ORDER — VALSARTAN-HYDROCHLOROTHIAZIDE 320-25 MG PO TABS: 1.0000 | ORAL_TABLET | Freq: Every day | ORAL | 1 refills | Status: DC

## 2020-04-08 MED ORDER — TRAZODONE HCL 50 MG PO TABS: 50.0000 mg | ORAL_TABLET | Freq: Every day | ORAL | 1 refills | Status: DC

## 2020-04-08 MED ORDER — TRAMADOL HCL 50 MG PO TABS: 50.0000 mg | ORAL_TABLET | Freq: Every day | ORAL | 0 refills | Status: DC | PRN

## 2020-04-08 MED ORDER — HYDROXYZINE HCL 10 MG PO TABS: 10.0000 mg | ORAL_TABLET | Freq: Every day | ORAL | 1 refills | Status: DC | PRN

## 2020-04-08 MED ORDER — MODAFINIL 100 MG PO TABS: 100.0000 mg | ORAL_TABLET | Freq: Every day | ORAL | 0 refills | Status: DC

## 2020-04-08 MED ORDER — ARIPIPRAZOLE 2 MG PO TABS: 2.0000 mg | ORAL_TABLET | Freq: Every evening | ORAL | 1 refills | Status: DC

## 2020-04-11 ENCOUNTER — Telehealth: Payer: Self-pay | Admitting: Family Medicine

## 2020-04-11 NOTE — Telephone Encounter (Signed)
Roberta calling from [facility] was calling for traZODone (DESYREL) 50 MG tablet [[id number]] - states hold unti;l 04/26/20. Unable to hold controled substance. Did Dr. Carlynn Purl want medication filled today? Please advise Cb- [phone] Reference Number [id number]

## 2020-04-12 NOTE — Telephone Encounter (Signed)
Called [facility] and le them know.

## 2020-06-27 ENCOUNTER — Ambulatory Visit: Payer: Self-pay | Admitting: *Deleted

## 2020-06-27 NOTE — Telephone Encounter (Signed)
Pt called in c/o her legs being swollen for the past month.   She works a job where she is on her feet 8 hrs a day.   Her right leg is swollen from above her knee to her ankle with indention marks left from her socks and when she presses on her ankle.   It's also tender.   She has gained 7 lbs over the last week.  Denies shortness of breath or chest discomfort.    She noticed when she got up this morning that she had a large indention where her left leg was laying on top of her right leg that she doesn't usually have.  "It seems I've gained a lot of fluid over the past week".    "I don't usually have these indention marks in my leg".   Her left leg is also swollen but not nearly like the right one.    The protocol is to be seen within 24 hours however there are no appts available with Dr. Carlynn Purl within that timeframe at [facility].   I have sent a high priority note to see if pt can be worked in sooner.   I instructed pt to go to the ED if she developed shortness of breath or chest discomfort of any kind.   She verbalized understanding.   I went over the care advice and keeping her legs elevated as much as she could.    It's ok to leave a detailed voicemail on her cell #[phone].   She prefers an appt this Thur. Or Fri. If possible due to her work schedule.

## 2020-06-27 NOTE — Telephone Encounter (Signed)
Left voicemail with recommendation to be seen at [facility] per Dr. Carlynn Purl.

## 2020-06-27 NOTE — Telephone Encounter (Signed)
Please advise where we can give appt for at this time no appts avail for any of the providers.

## 2020-06-27 NOTE — Telephone Encounter (Signed)
Reason for Disposition  [1] MODERATE leg swelling (e.g., swelling extends up to knees) AND [2] new-onset or worsening  Answer Assessment - Initial Assessment Questions 1. ONSET: "When did the swelling start?" (e.g., minutes, hours, days)     My ankle and leg is swollen.  The past month from top of knee to ankle it's been swelling.    This morning I got up at 11:00 and there was an indention where my leg was laying on top of it.   I've gained 7 lbs over the past week.   No cardiac or kidney problems I know of.   2. LOCATION: "What part of the leg is swollen?"  "Are both legs swollen or just one leg?"     Both swollen.    Right one is worse one.  I have arthritis in both knees. 3. SEVERITY: "How bad is the swelling?" (e.g., localized; mild, moderate, severe)  - Localized - small area of swelling localized to one leg  - MILD pedal edema - swelling limited to foot and ankle, pitting edema < 1/4 inch (6 mm) deep, rest and elevation eliminate most or all swelling  - MODERATE edema - swelling of lower leg to knee, pitting edema > 1/4 inch (6 mm) deep, rest and elevation only partially reduce swelling  - SEVERE edema - swelling extends above knee, facial or hand swelling present      I had a pedicure done and when she did the massage it was tender.  4. REDNESS: "Does the swelling look red or infected?"     Not red but tender to the touch my ankles.   But the indention is different for me. 5. PAIN: "Is the swelling painful to touch?" If Yes, ask: "How painful is it?"   (Scale 1-10; mild, moderate or severe)     3-5    I get an indention when I press on it.   I see where the sock crease is.  6. FEVER: "Do you have a fever?" If Yes, ask: "What is it, how was it measured, and when did it start?"      No 7. CAUSE: "What do you think is causing the leg swelling?"     I'm on my feet 8 hrs a day on my job but the last month has been wore 8. MEDICAL HISTORY: "Do you have a history of heart failure, kidney  disease, liver failure, or cancer?"     No kidney or heart problems 9. RECURRENT SYMPTOM: "Have you had leg swelling before?" If Yes, ask: "When was the last time?" "What happened that time?"     No     They will swell but not have the indentions like now and tender. 10. OTHER SYMPTOMS: "Do you have any other symptoms?" (e.g., chest pain, difficulty breathing)       No 11. PREGNANCY: "Is there any chance you are pregnant?" "When was your last menstrual period?"       Not asked  Protocols used: Leg Swelling and Edema-A-AH

## 2020-07-21 ENCOUNTER — Ambulatory Visit: Payer: 59 | Admitting: Family Medicine

## 2020-07-28 NOTE — Progress Notes (Signed)
Name: Taylor GibsonJustina F Pinsky   MRN: [id number]    DOB: 03/30/1965   Date:07/29/2020       Progress Note  Subjective  Chief Complaint  Follow Up  HPI  HTN: She denies dizziness, chest pain, headaches  or palpitation. Compliant with medications. BP is towards low end of normal, but tolerating it well She hs lower extremity edema , worse at the end of the day , advised compression stocking hoses    Morbid Obesity: she states she developed obesity after second child, went from 167 lbs to 225 lbs during pregnancy. Since Fall of 2019 she changed her diet, and we added Ozempic, it was  curbing her appetite her weight was improving , no longer medication because of cost, Saxenda not covered,she is struggling keeping her weight down BMI above 35 with co-morbidities such as OA ,HTN and dyslipidemia    OA: she is taking Tylenol during the day and tramadol qhs, she has been taking for many years. She continues to have daily aching pain, seen by Ortho had steroid injections in Nov 2021, has bone spurs, states worse when she gets home from work. 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 Medication is helping her stay and fall asleep but feels tired during  the day. Trazodone and hydroxizine has helped her sleep    Major Depression Mild: Pha 9 was up and we gave her Abilify but not covered by insurance, she still moody and is willing to try getting rx through [facility] . She has a long history of depression that goes up and down.    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, had debridement, doing better , she uses muporicin prn , stable   Patient Active Problem  List   Diagnosis Date Noted   Abnormal liver function tests 04/08/2020   Colon cancer screening 04/08/2020   Cholelithiasis 04/08/2020   Left upper quadrant pain 04/08/2020   Esophagitis 04/08/2020   Right upper quadrant pain 04/08/2020   Gastric ulcer 04/08/2020   Other esophagitis without bleeding 04/08/2020   Morbid (severe) obesity due to excess calories (HCC) 03/28/2017   Hyperglycemia 12/22/2015   Tendonitis, Achilles, right 01/11/2015   Anxiety disorder 12/07/2014   Complete rotator cuff tear 11/18/2014   Hypercholesterolemia 07/30/2014   Osteoarthritis of both knees 07/30/2014   Chronic constipation 08/29/2011   HTN (hypertension) 05/30/2011   Insomnia 05/30/2011   Obesity (BMI 30-39.9) 05/30/2011   GERD (gastroesophageal reflux disease) 05/30/2011    Past Surgical History:  Procedure Laterality Date   CHOLECYSTECTOMY  07/26/11   INTRAOPERATIVE CHOLANGIOGRAM  07/26/2011   Procedure: INTRAOPERATIVE CHOLANGIOGRAM;  Surgeon: Ardeth SportsmanSteven C. Gross, MD;  Location: [facility];  Service: General;  Laterality: N/A;   LIVER BIOPSY  07/26/2011   Procedure: LIVER BIOPSY;  Surgeon: Ardeth SportsmanSteven C. Gross, MD;  Location: [facility];  Service: General;  Laterality: N/A;  core liver biopsy    osteomilitis     right and left leg/surgery; right 1983; left 1988   osteomylitis  [id number]   right leg, left leg   SHOULDER OPEN ROTATOR  CUFF REPAIR Right 11/18/2014   Procedure: RIGHT SHOULDER MINI OPEN ROTATOR CUFF REPAIR SUBACROMIAL DECOMPRESSION  ;  Surgeon: Jene Every, MD;  Location: [facility];  Service: Orthopedics;  Laterality: Right;    Family History  Problem Relation Age of Onset   COPD Mother    Hypertension Mother    Hyperlipidemia Mother    Glaucoma Mother    Alcohol abuse Father    Colon cancer Father    Liver cancer Father    Cancer Maternal Aunt        breast   Breast cancer Paternal Aunt    Kidney failure Brother        He was waiting to get a kidney    Social History   Tobacco Use    Smoking status: Never   Smokeless tobacco: Never  Substance Use Topics   Alcohol use: Yes    Alcohol/week: 0.0 standard drinks    Comment: occassionally     Current Outpatient Medications:    amLODipine (NORVASC) 10 MG tablet, TAKE 1 TABLET DAILY, Disp: 90 tablet, Rfl: 1   aspirin EC 81 MG tablet, Take 1 tablet (81 mg total) by mouth daily., Disp: 30 tablet, Rfl: 0   b complex vitamins tablet, Take 1 tablet by mouth daily., Disp: , Rfl:    BIOTIN PO, Take 10,000 mg by mouth daily., Disp: , Rfl:    Black Cohosh 540 MG CAPS, Take 540 mg by mouth daily. , Disp: , Rfl:    diclofenac sodium (VOLTAREN) 1 % GEL, Apply 4 g topically 4 (four) times daily., Disp: 300 g, Rfl: 1   escitalopram (LEXAPRO) 10 MG tablet, Take 1 tablet (10 mg total) by mouth daily., Disp: 90 tablet, Rfl: 1   FIBER PO, Take 5 tablets by mouth 2 (two) times daily., Disp: , Rfl:    hydrOXYzine (ATARAX/VISTARIL) 10 MG tablet, Take 1 tablet (10 mg total) by mouth daily as needed., Disp: 90 tablet, Rfl: 1   ketotifen (ZADITOR) 0.025 % ophthalmic solution, 1 drop 2 (two) times daily., Disp: , Rfl:    levonorgestrel (MIRENA) 20 MCG/24HR IUD, 1 each by Intrauterine route once. , Disp: , Rfl:    Magnesium 500 MG TABS, , Disp: , Rfl:    Multiple Vitamin (MULTI-VITAMIN DAILY PO), Take 1 tablet by mouth daily with breakfast. Women's, Disp: , Rfl:    mupirocin ointment (BACTROBAN) 2 %, Place 1 application into the nose 2 (two) times daily., Disp: 30 g, Rfl: 3   Omega-3 Fatty Acids (FISH OIL) 1000 MG CAPS, Take 1,000 mg by mouth 2 (two) times daily., Disp: , Rfl:    omeprazole (PRILOSEC) 40 MG capsule, , Disp: , Rfl:    traMADol (ULTRAM) 50 MG tablet, Take 1 tablet (50 mg total) by mouth daily as needed. To last 90 days, Disp: 90 tablet, Rfl: 0   traZODone (DESYREL) 50 MG tablet, Take 1 tablet (50 mg total) by mouth at bedtime., Disp: 90 tablet, Rfl: 1   valsartan-hydrochlorothiazide (DIOVAN-HCT) 320-25 MG tablet, Take 1 tablet by  mouth daily., Disp: 90 tablet, Rfl: 1  Allergies  Allergen Reactions   Percocet [Oxycodone-Acetaminophen] Anaphylaxis    bronchospasms   Adhesive [Tape] Itching   Allergy Relief  [Chlorpheniramine Maleate]     Heart flutters   Other     Betadine=blisters skin  ,Cashew Nuts,sunflower seeds-swelling and itching   Oxycodone-Acetaminophen     Other reaction(s): Unknown   Penicillin G     Other reaction(s): Unknown   Penicillins  Hives    Has patient had a PCN reaction causing immediate rash, facial/tongue/throat swelling, SOB or lightheadedness with hypotension: no Has patient had a PCN reaction causing severe rash involving mucus membranes or skin necrosis: no Has patient had a PCN reaction that required hospitalization happened in the hospital Has patient had a PCN reaction occurring within the last 10 years: no If all of the above answers are "NO", then may proceed with Cephalosporin use.    Povidone Iodine     blisters blisters Other reaction(s): Unknown   Diphenhydramine Rash    I personally reviewed active problem list, medication list, allergies, family history, social history, health maintenance with the patient/caregiver today.   ROS  Constitutional: Negative for fever or weight change.  Respiratory: Negative for cough and shortness of breath.   Cardiovascular: Negative for chest pain or palpitations.  Gastrointestinal: Negative for abdominal pain, no bowel changes.  Musculoskeletal: Negative for gait problem or joint swelling.  Skin: Negative for rash.  Neurological: Negative for dizziness or headache.  No other specific complaints in a complete review of systems (except as listed in HPI above).   Objective  Vitals:   07/29/20 1129  BP: 112/68  Pulse: 68  Resp: 16  Temp: 98.1 F (36.7 C)  TempSrc: Oral  SpO2: 98%  Weight: 210 lb (95.3 kg)  Height: 5\' 1"  (1.549 m)    Body mass index is 39.68 kg/m.  Physical Exam  Constitutional: Patient appears  well-developed and well-nourished. Obese  No distress.  HEENT: head atraumatic, normocephalic, pupils equal and reactive to light, neck supple Cardiovascular: Normal rate, regular rhythm and normal heart sounds.  No murmur heard. Trace BLE edema. Pulmonary/Chest: Effort normal and breath sounds normal. No respiratory distress. Abdominal: Soft.  There is no tenderness. Psychiatric: Patient has a normal mood and affect. behavior is normal. Judgment and thought content normal.    PHQ2/9: Depression screen [facility] 2/9 07/29/2020 04/08/2020 10/29/2019 06/19/2019 03/13/2019  Decreased Interest 0 1 1 1  0  Down, Depressed, Hopeless 1 2 1 1 1   PHQ - 2 Score 1 3 2 2 1   Altered sleeping 0 1 0 0 1  Tired, decreased energy 0 0 0 0 0  Change in appetite 0 2 1 0 0  Feeling bad or failure about yourself  0 2 0 0 0  Trouble concentrating 0 0 0 0 1  Moving slowly or fidgety/restless 0 0 0 0 0  Suicidal thoughts 0 0 0 0 0  PHQ-9 Score 1 8 3 2 3   Difficult doing work/chores - Somewhat difficult Not difficult at all Not difficult at all -  Some recent data might be hidden    phq 9 is positive   Fall Risk: Fall Risk  07/29/2020 04/08/2020 10/29/2019 06/19/2019 03/13/2019  Falls in the past year? 0 0 0 0 0  Number falls in past yr: 0 0 0 0 0  Injury with Fall? 0 - 0 0 0  Follow up - Falls prevention discussed - Falls evaluation completed -     Functional Status Survey: Is the patient deaf or have difficulty hearing?: No Does the patient have difficulty seeing, even when wearing glasses/contacts?: No Does the patient have difficulty concentrating, remembering, or making decisions?: No Does the patient have difficulty walking or climbing stairs?: No Does the patient have difficulty dressing or bathing?: No Does the patient have difficulty doing errands alone such as visiting a doctor's office or shopping?: No    Assessment & Plan  1. Mild major depression (HCC)  - ARIPiprazole (ABILIFY) 2 MG tablet;  Take 1 tablet (2 mg total) by mouth every evening.  Dispense: 30 tablet; Refill: 0  2. Primary osteoarthritis of both knees  - traMADol (ULTRAM) 50 MG tablet; Take 1 tablet (50 mg total) by mouth daily as needed. To last 90 days  Dispense: 90 tablet; Refill: 0  3. Essential hypertension   4. Shift work sleep disorder   5. Hypercholesterolemia   6. Generalized anxiety disorder   7. Dyslipidemia   8. Morbid obesity (HCC)  Discussed with the patient the risk posed by an increased BMI. Discussed importance of portion control, calorie counting and at least 150 minutes of physical activity weekly. Avoid sweet beverages and drink more water. Eat at least 6 servings of fruit and vegetables daily

## 2020-07-29 ENCOUNTER — Encounter: Payer: Self-pay | Admitting: Family Medicine

## 2020-07-29 ENCOUNTER — Other Ambulatory Visit: Payer: Self-pay

## 2020-07-29 ENCOUNTER — Ambulatory Visit (INDEPENDENT_AMBULATORY_CARE_PROVIDER_SITE_OTHER): Payer: 59 | Admitting: Family Medicine

## 2020-07-29 VITALS — BP 112/68 | HR 68 | Temp 98.1°F | Resp 16 | Ht 61.0 in | Wt 210.0 lb

## 2020-07-29 DIAGNOSIS — E785 Hyperlipidemia, unspecified: Secondary | ICD-10-CM

## 2020-07-29 DIAGNOSIS — I10 Essential (primary) hypertension: Secondary | ICD-10-CM | POA: Diagnosis not present

## 2020-07-29 DIAGNOSIS — M170 Bilateral primary osteoarthritis of knee: Secondary | ICD-10-CM

## 2020-07-29 DIAGNOSIS — G4726 Circadian rhythm sleep disorder, shift work type: Secondary | ICD-10-CM

## 2020-07-29 DIAGNOSIS — F320 Major depressive disorder, single episode, mild: Secondary | ICD-10-CM

## 2020-07-29 DIAGNOSIS — E7800 Pure hypercholesterolemia, unspecified: Secondary | ICD-10-CM

## 2020-07-29 DIAGNOSIS — F411 Generalized anxiety disorder: Secondary | ICD-10-CM

## 2020-07-29 MED ORDER — ARIPIPRAZOLE 2 MG PO TABS: 2.0000 mg | ORAL_TABLET | Freq: Every evening | ORAL | 0 refills | Status: DC

## 2020-07-29 MED ORDER — TRAMADOL HCL 50 MG PO TABS: 50.0000 mg | ORAL_TABLET | Freq: Every day | ORAL | 0 refills | Status: DC | PRN

## 2020-08-03 ENCOUNTER — Encounter: Payer: Self-pay | Admitting: Family Medicine

## 2020-08-04 ENCOUNTER — Other Ambulatory Visit: Payer: Self-pay | Admitting: Family Medicine

## 2020-08-04 DIAGNOSIS — G4726 Circadian rhythm sleep disorder, shift work type: Secondary | ICD-10-CM

## 2020-08-04 MED ORDER — MODAFINIL 100 MG PO TABS: 100.0000 mg | ORAL_TABLET | Freq: Every day | ORAL | 2 refills | Status: DC

## 2020-08-30 ENCOUNTER — Other Ambulatory Visit: Payer: Self-pay

## 2020-08-30 DIAGNOSIS — F320 Major depressive disorder, single episode, mild: Secondary | ICD-10-CM

## 2020-08-31 ENCOUNTER — Other Ambulatory Visit: Payer: Self-pay | Admitting: Family Medicine

## 2020-08-31 DIAGNOSIS — F320 Major depressive disorder, single episode, mild: Secondary | ICD-10-CM

## 2020-08-31 MED ORDER — ARIPIPRAZOLE 2 MG PO TABS: 2.0000 mg | ORAL_TABLET | Freq: Every evening | ORAL | 1 refills | Status: DC

## 2020-08-31 NOTE — Telephone Encounter (Signed)
Next appt is 10/27/20

## 2020-08-31 NOTE — Telephone Encounter (Signed)
Copied from CRM [id number]. Topic: Quick Communication - Rx Refill/Question >> Aug 31, 2020  9:59 AM Gaetana Michaelis A wrote: Medication: ARIPiprazole (ABILIFY) 2 MG tablet   Has the patient contacted their pharmacy? Yes.   (Agent: If no, request that the patient contact the pharmacy for the refill.) (Agent: If yes, when and what did the pharmacy advise?)  Preferred Pharmacy (with phone number or street name): [facility] [address] [place], [place] - [address] [address] [phone] [address]  Phone:  [phone] Fax:  [phone]  Agent: Please be advised that RX refills may take up to 3 business days. We ask that you follow-up with your pharmacy.

## 2020-08-31 NOTE — Telephone Encounter (Signed)
   Notes to clinic:  Patient requesting refill to go to [facility] in [facility]    Requested Prescriptions  Pending Prescriptions Disp Refills   ARIPiprazole (ABILIFY) 2 MG tablet 30 tablet 1    Sig: Take 1 tablet (2 mg total) by mouth every evening.     Not Delegated - Psychiatry:  Antipsychotics - Second Generation (Atypical) - aripiprazole Failed - 08/31/2020 10:13 AM      Failed - This refill cannot be delegated      Passed - Valid encounter within last 6 months    Recent Outpatient Visits           1 month ago Essential hypertension   [facility] Alba Cory, MD   4 months ago Essential hypertension   [facility] Alba Cory, MD   10 months ago Mild major depression [facility])   [facility] Alba Cory, MD   1 year ago Morbid obesity [facility])   [facility] Alba Cory, MD   1 year ago Hypercholesterolemia   [facility] Alba Cory, MD       Future Appointments             In 1 month Alba Cory, MD [facility]   In 2 months Alba Cory, MD [facility]

## 2020-09-09 ENCOUNTER — Other Ambulatory Visit: Payer: Self-pay | Admitting: Family Medicine

## 2020-09-09 DIAGNOSIS — I10 Essential (primary) hypertension: Secondary | ICD-10-CM

## 2020-10-05 ENCOUNTER — Other Ambulatory Visit: Payer: Self-pay | Admitting: Family Medicine

## 2020-10-05 DIAGNOSIS — F411 Generalized anxiety disorder: Secondary | ICD-10-CM

## 2020-10-05 DIAGNOSIS — F320 Major depressive disorder, single episode, mild: Secondary | ICD-10-CM

## 2020-10-05 DIAGNOSIS — I10 Essential (primary) hypertension: Secondary | ICD-10-CM

## 2020-10-05 NOTE — Telephone Encounter (Signed)
Requested medications are due for refill today.  yes  Requested medications are on the active medications list.  yes  Last refill. 04/08/2020  Future visit scheduled.   yes  Notes to clinic.  Labs are expired.

## 2020-10-17 ENCOUNTER — Other Ambulatory Visit: Payer: Self-pay | Admitting: Family Medicine

## 2020-10-17 DIAGNOSIS — F411 Generalized anxiety disorder: Secondary | ICD-10-CM

## 2020-10-26 NOTE — Progress Notes (Signed)
Name: LETY CULLENS   MRN: [id number]    DOB: 02/02/1965   Date:10/27/2020       Progress Note  Subjective  Chief Complaint  Follow Up  HPI  HTN: She denies dizziness, chest pain, headaches, SOB  or palpitation. Compliant with medications. BP is at goal, lower extremity edema is better with compression stocking hoses    Morbid Obesity: she states she developed obesity after second child, went from 167 lbs to 225 lbs during pregnancy. Since Fall of 2019 she changed her diet, and we added Ozempic, it was  curbing her appetite her weight was improving , no longer medication because of cost, Saxenda not covered,she is struggling keeping her weight down but lost one pounds since last visit. She has BMI above 35 with co-morbidities such as OA ,HTN and dyslipidemia    OA: she is taking Tylenol during the day and tramadol qhs, she has been taking for many years. She continues to have daily aching pain, seen by Ortho had steroid injections in Nov 2021, has bone spurs, states worse when she gets home from work. She states pain right now is 0/10 and she worked last night. She took Tramadol before she went to bed last night. She states pain can be 7/10 without medication but has been around 5/10 with medications.    GAD/MDD: she is off alprazolam , under control with  Lexapro, also taking Hydroxyzine prn and we added Abilify Summer 2022 and she states she has noticed she has been less moody. She has a long history of depression but states Lexapro and Abilify have helped her a lot . 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 Medication is helping her stay and fall asleep but feels tired during  the day. Stable and needs refills. Taking Armodafinil since the Summer and has noticed more alertness during working hours.   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 Unchanged   GERD/esophagitis: under the care of Dr. Titus Mould and is now on Nexium and Colestipol , had LUQ pain yesterday but it resolved with ice cream   Patient Active Problem List   Diagnosis Date Noted   Abnormal liver function tests 04/08/2020   Cholelithiasis 04/08/2020   Left upper quadrant pain 04/08/2020   Esophagitis 04/08/2020   Right upper quadrant pain 04/08/2020   Gastric ulcer 04/08/2020   Other esophagitis without bleeding 04/08/2020   Morbid (severe) obesity due to excess calories (HCC) 03/28/2017   Hyperglycemia 12/22/2015   Tendonitis, Achilles, right 01/11/2015   Anxiety disorder 12/07/2014   Complete rotator cuff tear 11/18/2014   Hypercholesterolemia 07/30/2014   Osteoarthritis of both knees 07/30/2014   Chronic constipation 08/29/2011   HTN (hypertension) 05/30/2011   Insomnia 05/30/2011   Obesity (BMI 30-39.9) 05/30/2011   GERD (gastroesophageal reflux disease) 05/30/2011    Past Surgical History:  Procedure Laterality Date   CHOLECYSTECTOMY  07/26/11   INTRAOPERATIVE CHOLANGIOGRAM  07/26/2011   Procedure: INTRAOPERATIVE CHOLANGIOGRAM;  Surgeon: Ardeth Sportsman, MD;  Location: [facility];  Service: General;  Laterality: N/A;   LIVER BIOPSY  07/26/2011   Procedure: LIVER BIOPSY;  Surgeon: Ardeth Sportsman, MD;  Location: [facility];  Service: General;  Laterality: N/A;  core liver biopsy    osteomilitis  right and left leg/surgery; right 1983; left 1988   osteomylitis  [id number]   right leg, left leg   SHOULDER OPEN ROTATOR CUFF REPAIR Right 11/18/2014   Procedure: RIGHT SHOULDER MINI OPEN ROTATOR CUFF REPAIR SUBACROMIAL DECOMPRESSION  ;  Surgeon: Jene Every, MD;  Location: [facility];  Service: Orthopedics;  Laterality: Right;    Family History  Problem Relation Age of Onset   COPD Mother    Hypertension Mother    Hyperlipidemia Mother    Glaucoma Mother    Alcohol abuse Father    Colon cancer Father    Liver  cancer Father    Cancer Maternal Aunt        breast   Breast cancer Paternal Aunt    Kidney failure Brother        He was waiting to get a kidney    Social History   Tobacco Use   Smoking status: Never   Smokeless tobacco: Never  Substance Use Topics   Alcohol use: Yes    Alcohol/week: 0.0 standard drinks    Comment: occassionally     Current Outpatient Medications:    amLODipine (NORVASC) 10 MG tablet, TAKE 1 TABLET DAILY, Disp: 90 tablet, Rfl: 1   aspirin EC 81 MG tablet, Take 1 tablet (81 mg total) by mouth daily., Disp: 30 tablet, Rfl: 0   b complex vitamins tablet, Take 1 tablet by mouth daily., Disp: , Rfl:    BIOTIN PO, Take 10,000 mg by mouth daily., Disp: , Rfl:    Black Cohosh 540 MG CAPS, Take 540 mg by mouth daily. , Disp: , Rfl:    diclofenac sodium (VOLTAREN) 1 % GEL, Apply 4 g topically 4 (four) times daily., Disp: 300 g, Rfl: 1   escitalopram (LEXAPRO) 10 MG tablet, TAKE 1 TABLET DAILY, Disp: 90 tablet, Rfl: 1   FIBER PO, Take 5 tablets by mouth 2 (two) times daily., Disp: , Rfl:    hydrOXYzine (ATARAX/VISTARIL) 10 MG tablet, TAKE 1 TABLET DAILY AS NEEDED, Disp: 90 tablet, Rfl: 0   ketotifen (ZADITOR) 0.025 % ophthalmic solution, 1 drop 2 (two) times daily., Disp: , Rfl:    levonorgestrel (MIRENA) 20 MCG/24HR IUD, 1 each by Intrauterine route once. , Disp: , Rfl:    Magnesium 500 MG TABS, , Disp: , Rfl:    Multiple Vitamin (MULTI-VITAMIN DAILY PO), Take 1 tablet by mouth daily with breakfast. Women's, Disp: , Rfl:    mupirocin ointment (BACTROBAN) 2 %, Place 1 application into the nose 2 (two) times daily., Disp: 30 g, Rfl: 3   Omega-3 Fatty Acids (FISH OIL) 1000 MG CAPS, Take 1,000 mg by mouth 2 (two) times daily., Disp: , Rfl:    omeprazole (PRILOSEC) 40 MG capsule, , Disp: , Rfl:    traMADol (ULTRAM) 50 MG tablet, Take 1 tablet (50 mg total) by mouth daily as needed. To last 90 days, Disp: 90 tablet, Rfl: 0   valsartan-hydrochlorothiazide (DIOVAN-HCT) 320-25  MG tablet, TAKE 1 TABLET DAILY, Disp: 90 tablet, Rfl: 0   ARIPiprazole (ABILIFY) 2 MG tablet, Take 1 tablet (2 mg total) by mouth every evening., Disp: 90 tablet, Rfl: 1   modafinil (PROVIGIL) 100 MG tablet, Take 1 tablet (100 mg total) by mouth daily., Disp: 90 tablet, Rfl: 0   traZODone (DESYREL) 50 MG tablet, Take 1 tablet (50 mg total) by mouth at bedtime., Disp: 90 tablet, Rfl: 1  Allergies  Allergen Reactions   Percocet [Oxycodone-Acetaminophen] Anaphylaxis    bronchospasms  Adhesive [Tape] Itching   Allergy Relief  [Chlorpheniramine Maleate]     Heart flutters   Other     Betadine=blisters skin  ,Cashew Nuts,sunflower seeds-swelling and itching   Oxycodone-Acetaminophen     Other reaction(s): Unknown   Penicillin G     Other reaction(s): Unknown   Penicillins Hives    Has patient had a PCN reaction causing immediate rash, facial/tongue/throat swelling, SOB or lightheadedness with hypotension: no Has patient had a PCN reaction causing severe rash involving mucus membranes or skin necrosis: no Has patient had a PCN reaction that required hospitalization happened in the hospital Has patient had a PCN reaction occurring within the last 10 years: no If all of the above answers are "NO", then may proceed with Cephalosporin use.    Povidone Iodine     blisters blisters Other reaction(s): Unknown   Diphenhydramine Rash    I personally reviewed active problem list, medication list, allergies, family history, social history with the patient/caregiver today.   ROS  Constitutional: Negative for fever or weight change.  Respiratory: Negative for cough and shortness of breath.   Cardiovascular: Negative for chest pain or palpitations.  Gastrointestinal: Negative for abdominal pain, no bowel changes.  Musculoskeletal: Negative for gait problem or joint swelling.  Skin: Negative for rash.  Neurological: Negative for dizziness or headache.  No other specific complaints in a  complete review of systems (except as listed in HPI above).   Objective  Vitals:   10/27/20 1123  BP: 120/78  Pulse: 87  Resp: 16  Temp: 98 F (36.7 C)  SpO2: 95%  Weight: 209 lb (94.8 kg)  Height: 5\' 1"  (1.549 m)    Body mass index is 39.49 kg/m.  Physical Exam  Constitutional: Patient appears well-developed and well-nourished. Obese  No distress.  HEENT: head atraumatic, normocephalic, pupils equal and reactive to light, neck supple Cardiovascular: Normal rate, regular rhythm and normal heart sounds.  No murmur heard. Trace  BLE edema. Pulmonary/Chest: Effort normal and breath sounds normal. No respiratory distress. Abdominal: Soft.  There is no tenderness. Psychiatric: Patient has a normal mood and affect. behavior is normal. Judgment and thought content normal.   PHQ2/9: Depression screen [facility] 2/9 10/27/2020 07/29/2020 04/08/2020 10/29/2019 06/19/2019  Decreased Interest 0 0 1 1 1   Down, Depressed, Hopeless 1 1 2 1 1   PHQ - 2 Score 1 1 3 2 2   Altered sleeping 0 0 1 0 0  Tired, decreased energy 0 0 0 0 0  Change in appetite 1 0 2 1 0  Feeling bad or failure about yourself  0 0 2 0 0  Trouble concentrating 0 0 0 0 0  Moving slowly or fidgety/restless 0 0 0 0 0  Suicidal thoughts 0 0 0 0 0  PHQ-9 Score 2 1 8 3 2   Difficult doing work/chores - - Somewhat difficult Not difficult at all Not difficult at all  Some recent data might be hidden    phq 9 is positive   Fall Risk: Fall Risk  10/27/2020 07/29/2020 04/08/2020 10/29/2019 06/19/2019  Falls in the past year? 0 0 0 0 0  Number falls in past yr: 0 0 0 0 0  Injury with Fall? 0 0 - 0 0  Risk for fall due to : No Fall Risks - - - -  Follow up Falls prevention discussed - Falls prevention discussed - Falls evaluation completed     Functional Status Survey: Is the patient deaf or have difficulty hearing?:  No Does the patient have difficulty seeing, even when wearing glasses/contacts?: Yes Does the patient have  difficulty concentrating, remembering, or making decisions?: No Does the patient have difficulty walking or climbing stairs?: No Does the patient have difficulty dressing or bathing?: No Does the patient have difficulty doing errands alone such as visiting a doctor's office or shopping?: No    Assessment & Plan  1. Mild major depression (HCC)  - ARIPiprazole (ABILIFY) 2 MG tablet; Take 1 tablet (2 mg total) by mouth every evening.  Dispense: 90 tablet; Refill: 1  2. Shift work sleep disorder  - modafinil (PROVIGIL) 100 MG tablet; Take 1 tablet (100 mg total) by mouth daily.  Dispense: 90 tablet; Refill: 0 - traZODone (DESYREL) 50 MG tablet; Take 1 tablet (50 mg total) by mouth at bedtime.  Dispense: 90 tablet; Refill: 1  3. Essential hypertension  - COMPLETE METABOLIC PANEL WITH GFR - CBC with Differential/Platelet  4. Primary osteoarthritis of both knees   5. Dyslipidemia  - Lipid panel  6. Morbid obesity (HCC)  Discussed with the patient the risk posed by an increased BMI. Discussed importance of portion control, calorie counting and at least 150 minutes of physical activity weekly. Avoid sweet beverages and drink more water. Eat at least 6 servings of fruit and vegetables daily    7. Generalized anxiety disorder   8. Hyperglycemia   9. Gastroesophageal reflux disease without esophagitis   10. Routine screening for STI (sexually transmitted infection)  - RPR - HIV Antibody (routine testing w rflx)

## 2020-10-27 ENCOUNTER — Ambulatory Visit (INDEPENDENT_AMBULATORY_CARE_PROVIDER_SITE_OTHER): Payer: 59 | Admitting: Family Medicine

## 2020-10-27 ENCOUNTER — Other Ambulatory Visit: Payer: Self-pay

## 2020-10-27 ENCOUNTER — Encounter: Payer: Self-pay | Admitting: Family Medicine

## 2020-10-27 VITALS — BP 120/78 | HR 87 | Temp 98.0°F | Resp 16 | Ht 61.0 in | Wt 209.0 lb

## 2020-10-27 DIAGNOSIS — E785 Hyperlipidemia, unspecified: Secondary | ICD-10-CM

## 2020-10-27 DIAGNOSIS — K219 Gastro-esophageal reflux disease without esophagitis: Secondary | ICD-10-CM

## 2020-10-27 DIAGNOSIS — Z113 Encounter for screening for infections with a predominantly sexual mode of transmission: Secondary | ICD-10-CM

## 2020-10-27 DIAGNOSIS — G4726 Circadian rhythm sleep disorder, shift work type: Secondary | ICD-10-CM

## 2020-10-27 DIAGNOSIS — M170 Bilateral primary osteoarthritis of knee: Secondary | ICD-10-CM

## 2020-10-27 DIAGNOSIS — F411 Generalized anxiety disorder: Secondary | ICD-10-CM

## 2020-10-27 DIAGNOSIS — R739 Hyperglycemia, unspecified: Secondary | ICD-10-CM

## 2020-10-27 DIAGNOSIS — I10 Essential (primary) hypertension: Secondary | ICD-10-CM | POA: Diagnosis not present

## 2020-10-27 DIAGNOSIS — F320 Major depressive disorder, single episode, mild: Secondary | ICD-10-CM | POA: Diagnosis not present

## 2020-10-27 MED ORDER — ARIPIPRAZOLE 2 MG PO TABS: 2.0000 mg | ORAL_TABLET | Freq: Every evening | ORAL | 1 refills | Status: DC

## 2020-10-27 MED ORDER — TRAZODONE HCL 50 MG PO TABS: 50.0000 mg | ORAL_TABLET | Freq: Every day | ORAL | 1 refills | Status: DC

## 2020-10-27 MED ORDER — MODAFINIL 100 MG PO TABS: 100.0000 mg | ORAL_TABLET | Freq: Every day | ORAL | 0 refills | Status: DC

## 2020-10-28 ENCOUNTER — Encounter: Payer: Self-pay | Admitting: Family Medicine

## 2020-10-31 ENCOUNTER — Other Ambulatory Visit: Payer: Self-pay | Admitting: Family Medicine

## 2020-10-31 DIAGNOSIS — D649 Anemia, unspecified: Secondary | ICD-10-CM

## 2020-10-31 DIAGNOSIS — R7989 Other specified abnormal findings of blood chemistry: Secondary | ICD-10-CM

## 2020-11-01 ENCOUNTER — Other Ambulatory Visit: Payer: Self-pay

## 2020-11-01 DIAGNOSIS — D649 Anemia, unspecified: Secondary | ICD-10-CM

## 2020-11-01 DIAGNOSIS — R7989 Other specified abnormal findings of blood chemistry: Secondary | ICD-10-CM

## 2020-11-02 LAB — HIV ANTIBODY (ROUTINE TESTING W REFLEX): HIV 1&2 Ab, 4th Generation: NONREACTIVE

## 2020-11-02 LAB — COMPLETE METABOLIC PANEL WITH GFR
AG Ratio: 1.8 (calc) (ref 1.0–2.5)
ALT: 53 U/L — ABNORMAL HIGH (ref 6–29)
AST: 123 U/L — ABNORMAL HIGH (ref 10–35)
Albumin: 4 g/dL (ref 3.6–5.1)
Alkaline phosphatase (APISO): 92 U/L (ref 37–153)
BUN: 19 mg/dL (ref 7–25)
CO2: 34 mmol/L — ABNORMAL HIGH (ref 20–32)
Calcium: 9.1 mg/dL (ref 8.6–10.4)
Chloride: 100 mmol/L (ref 98–110)
Creat: 0.71 mg/dL (ref 0.50–1.03)
Globulin: 2.2 g/dL (calc) (ref 1.9–3.7)
Glucose, Bld: 89 mg/dL (ref 65–99)
Potassium: 3.7 mmol/L (ref 3.5–5.3)
Sodium: 140 mmol/L (ref 135–146)
Total Bilirubin: 0.7 mg/dL (ref 0.2–1.2)
Total Protein: 6.2 g/dL (ref 6.1–8.1)
eGFR: 100 mL/min/{1.73_m2} (ref >=60)

## 2020-11-02 LAB — CBC WITH DIFFERENTIAL/PLATELET
Absolute Monocytes: 383 cells/uL (ref 200–950)
Basophils Absolute: 22 cells/uL (ref 0–200)
Basophils Relative: 0.5 %
Eosinophils Absolute: 353 cells/uL (ref 15–500)
Eosinophils Relative: 8.2 %
HCT: 35.8 % (ref 35.0–45.0)
Hemoglobin: 11.3 g/dL — ABNORMAL LOW (ref 11.7–15.5)
Lymphs Abs: 1114 cells/uL (ref 850–3900)
MCH: 28.4 pg (ref 27.0–33.0)
MCHC: 31.6 g/dL — ABNORMAL LOW (ref 32.0–36.0)
MCV: 89.9 fL (ref 80.0–100.0)
MPV: 11.3 fL (ref 7.5–12.5)
Monocytes Relative: 8.9 %
Neutro Abs: 2430 cells/uL (ref 1500–7800)
Neutrophils Relative %: 56.5 %
Platelets: 260 10*3/uL (ref 140–400)
RBC: 3.98 10*6/uL (ref 3.80–5.10)
RDW: 12.4 % (ref 11.0–15.0)
Total Lymphocyte: 25.9 %
WBC: 4.3 10*3/uL (ref 3.8–10.8)

## 2020-11-02 LAB — TEST AUTHORIZATION

## 2020-11-02 LAB — IRON,TIBC AND FERRITIN PANEL
%SAT: 15 % (calc) — ABNORMAL LOW (ref 16–45)
Ferritin: 28 ng/mL (ref 16–232)
Iron: 57 ug/dL (ref 45–160)
TIBC: 386 mcg/dL (calc) (ref 250–450)

## 2020-11-02 LAB — LIPID PANEL
Cholesterol: 204 mg/dL — ABNORMAL HIGH (ref <200)
HDL: 65 mg/dL (ref >=50)
LDL Cholesterol (Calc): 118 mg/dL (calc) — ABNORMAL HIGH
Non-HDL Cholesterol (Calc): 139 mg/dL (calc) — ABNORMAL HIGH (ref <130)
Total CHOL/HDL Ratio: 3.1 (calc) (ref <5.0)
Triglycerides: 105 mg/dL (ref <150)

## 2020-11-02 LAB — RPR: RPR Ser Ql: NONREACTIVE

## 2020-11-09 ENCOUNTER — Encounter: Payer: Self-pay | Admitting: Family Medicine

## 2020-11-09 ENCOUNTER — Other Ambulatory Visit: Payer: Self-pay | Admitting: Family Medicine

## 2020-11-09 DIAGNOSIS — F320 Major depressive disorder, single episode, mild: Secondary | ICD-10-CM

## 2020-11-09 DIAGNOSIS — G4726 Circadian rhythm sleep disorder, shift work type: Secondary | ICD-10-CM

## 2020-11-17 NOTE — Progress Notes (Deleted)
Name: Taylor Hurst   MRN: [id number]    DOB: 1965-09-06   Date:11/17/2020       Progress Note  Subjective  Chief Complaint  Annual Exam  HPI  Taylor Hurst is a 55 year old female here for annual CPE. Chronic medical conditions HTN, HLD, GERD, OA, MDD. She was just seen in this office on   Diet: doesn't eat bread, fatty foods. Tries to eat high fiber. Just drinks water, no soda. Exercise: on feet 8 hours a day at work, parks further away to walk a little more    [employer] Visit from 10/29/2019 in [facility]  AUDIT-C Score 0      Depression: Phq 9 is  positive Depression screen [facility] 2/9 11/18/2020 10/27/2020 07/29/2020 04/08/2020 10/29/2019  Decreased Interest 1 0 0 1 1  Down, Depressed, Hopeless _0 PHQ - 2 Score _1 Altered sleeping - 0 0 1 0  Tired, decreased energy - 0 0 0 0  Change in appetite - 1 0 2 1  Feeling bad or failure about yourself  - 0 0 2 0  Trouble concentrating - 0 0 0 0  Moving slowly or fidgety/restless - 0 0 0 0  Suicidal thoughts - 0 0 0 0  PHQ-9 Score - _2 Difficult doing work/chores - - - Somewhat difficult Not difficult at all  Some recent data might be hidden   Hypertension: BP Readings from Last 3 Encounters:  11/18/20 134/80  10/27/20 120/78  07/29/20 112/68   Obesity: Wt Readings from Last 3 Encounters:  11/18/20 206 lb (93.4 kg)  10/27/20 209 lb (94.8 kg)  07/29/20 210 lb (95.3 kg)   BMI Readings from Last 3 Encounters:  11/18/20 38.92 kg/m  10/27/20 39.49 kg/m  07/29/20 39.68 kg/m     Vaccines:  HPV: up to at age 64 , ask insurance if age between 76-45  Shingrix: 25-64 yo and ask insurance if covered when patient above 54 yo - patient had first dose of Shringrix vaccine and is scheduled the first week of December for the second dose Pneumonia: educated and discussed with patient. Flu: educated and discussed with patient.  Hep C Screening: 08/29/18 STD testing and prevention  (HIV/chl/gon/syphilis): 10/13/222 Intimate partner violence: negative Sexual History : currently sexually active  Menstrual History/LMP/Abnormal Bleeding: none Incontinence Symptoms: none  Breast cancer:  - Last Mammogram: 01/01/20 - BRCA gene screening: N/A  Osteoporosis: Discussed high calcium and vitamin D supplementation, weight bearing exercises  Cervical cancer screening: 11/12/19  Skin cancer: Discussed monitoring for atypical lesions  Colorectal cancer: 06/09/15   Lung cancer: Low Dose CT Chest recommended if Age 90-80 years, 20 pack-year currently smoking OR have quit w/in 15years. Patient does not qualify.   ECG: 11/12/14  Advanced Care Planning: A voluntary discussion about advance care planning including the explanation and discussion of advance directives.  Discussed health care proxy and Living will, and the patient was able to identify a health care proxy as son Taylor Hurst.  Patient does not have a living will at present time. If patient does have living will, I have requested they bring this to the clinic to be scanned in to their chart.  Lipids: Lab Results  Component Value Date   CHOL 204 (H) 10/27/2020   CHOL 185 10/29/2019   CHOL 218 (H) 08/29/2018   Lab Results  Component Value Date   HDL 65  10/27/2020   HDL 62 10/29/2019   HDL 61 08/29/2018   Lab Results  Component Value Date   LDLCALC 118 (H) 10/27/2020   LDLCALC 105 (H) 10/29/2019   LDLCALC 137 (H) 08/29/2018   Lab Results  Component Value Date   TRIG 105 10/27/2020   TRIG 90 10/29/2019   TRIG 98 08/29/2018   Lab Results  Component Value Date   CHOLHDL 3.1 10/27/2020   CHOLHDL 3.0 10/29/2019   CHOLHDL 3.6 08/29/2018   No results found for: LDLDIRECT  Glucose: Glucose, Bld  Date Value Ref Range Status  10/27/2020 89 65 - 99 mg/dL Final    Comment:    .            Fasting reference interval .   10/29/2019 92 65 - 99 mg/dL Final    Comment:    .            Fasting reference  interval .   08/29/2018 91 65 - 99 mg/dL Final    Comment:    .            Fasting reference interval .     Patient Active Problem List   Diagnosis Date Noted  . Abnormal liver function tests 04/08/2020  . Cholelithiasis 04/08/2020  . Left upper quadrant pain 04/08/2020  . Esophagitis 04/08/2020  . Right upper quadrant pain 04/08/2020  . Gastric ulcer 04/08/2020  . Other esophagitis without bleeding 04/08/2020  . Morbid (severe) obesity due to excess calories ([place]) 03/28/2017  . Hyperglycemia 12/22/2015  . Tendonitis, Achilles, right 01/11/2015  . Anxiety disorder 12/07/2014  . Complete rotator cuff tear 11/18/2014  . Hypercholesterolemia 07/30/2014  . Osteoarthritis of both knees 07/30/2014  . Chronic constipation 08/29/2011  . HTN (hypertension) 05/30/2011  . Insomnia 05/30/2011  . Obesity (BMI 30-39.9) 05/30/2011  . GERD (gastroesophageal reflux disease) 05/30/2011    Past Surgical History:  Procedure Laterality Date  . CHOLECYSTECTOMY  07/26/11  . INTRAOPERATIVE CHOLANGIOGRAM  07/26/2011   Procedure: INTRAOPERATIVE CHOLANGIOGRAM;  Surgeon: Adin Hector, MD;  Location: [facility];  Service: General;  Laterality: N/A;  . LIVER BIOPSY  07/26/2011   Procedure: LIVER BIOPSY;  Surgeon: Adin Hector, MD;  Location: [facility];  Service: General;  Laterality: N/A;  core liver biopsy   . osteomilitis     right and left leg/surgery; right 1983; left 1988  . osteomylitis  [id number]   right leg, left leg  . SHOULDER OPEN ROTATOR CUFF REPAIR Right 11/18/2014   Procedure: RIGHT SHOULDER MINI OPEN ROTATOR CUFF REPAIR SUBACROMIAL DECOMPRESSION  ;  Surgeon: Susa Day, MD;  Location: [facility];  Service: Orthopedics;  Laterality: Right;    Family History  Problem Relation Age of Onset  . COPD Mother   . Hypertension Mother   . Hyperlipidemia Mother   . Glaucoma Mother   . Alcohol abuse Father   . Colon cancer Father   . Liver cancer Father   . Cancer Maternal Aunt         breast  . Breast cancer Paternal Aunt   . Kidney failure Brother        He was waiting to get a kidney    Social History   Socioeconomic History  . Marital status: Married    Spouse name: Not on file  . Number of children: 2  . Years of education: Not on file  . Highest education level: Not on file  Occupational History  . Not on file  Tobacco Use  . Smoking status: Never  . Smokeless tobacco: Never  Vaping Use  . Vaping Use: Never used  Substance and Sexual Activity  . Alcohol use: Yes    Alcohol/week: 0.0 standard drinks    Comment: occassionally  . Drug use: No  . Sexual activity: Yes    Birth control/protection: I.U.D.    Comment: Mirena  Other Topics Concern  . Not on file  Social History Narrative  . Not on file   Social Determinants of Health   Financial Resource Strain: Low Risk   . Difficulty of Paying Living Expenses: Not hard at all  Food Insecurity: No Food Insecurity  . Worried About Charity fundraiser in the Last Year: Never true  . Ran Out of Food in the Last Year: Never true  Transportation Needs: No Transportation Needs  . Lack of Transportation (Medical): No  . Lack of Transportation (Non-Medical): No  Physical Activity: Insufficiently Active  . Days of Exercise per Week: 4 days  . Minutes of Exercise per Session: 10 min  Stress: No Stress Concern Present  . Feeling of Stress : Only a little  Social Connections: Not on file  Intimate Partner Violence: Not At Risk  . Fear of Current or Ex-Partner: No  . Emotionally Abused: No  . Physically Abused: No  . Sexually Abused: No     Current Outpatient Medications:  .  amLODipine (NORVASC) 10 MG tablet, TAKE 1 TABLET DAILY, Disp: 90 tablet, Rfl: 1 .  ARIPiprazole (ABILIFY) 2 MG tablet, Take 1 tablet by mouth in the evening, Disp: 30 tablet, Rfl: 0 .  aspirin EC 81 MG tablet, Take 1 tablet (81 mg total) by mouth daily., Disp: 30 tablet, Rfl: 0 .  b complex vitamins tablet, Take 1 tablet by mouth  daily., Disp: , Rfl:  .  BIOTIN PO, Take 10,000 mg by mouth daily., Disp: , Rfl:  .  Black Cohosh 540 MG CAPS, Take 540 mg by mouth daily. , Disp: , Rfl:  .  colestipol (COLESTID) 1 g tablet, Take 2 g by mouth 2 (two) times daily., Disp: , Rfl:  .  diclofenac sodium (VOLTAREN) 1 % GEL, Apply 4 g topically 4 (four) times daily., Disp: 300 g, Rfl: 1 .  escitalopram (LEXAPRO) 10 MG tablet, TAKE 1 TABLET DAILY, Disp: 90 tablet, Rfl: 1 .  FIBER PO, Take 5 tablets by mouth 2 (two) times daily., Disp: , Rfl:  .  hydrOXYzine (ATARAX/VISTARIL) 10 MG tablet, TAKE 1 TABLET DAILY AS NEEDED, Disp: 90 tablet, Rfl: 0 .  ketotifen (ZADITOR) 0.025 % ophthalmic solution, 1 drop 2 (two) times daily., Disp: , Rfl:  .  levonorgestrel (MIRENA) 20 MCG/24HR IUD, 1 each by Intrauterine route once. , Disp: , Rfl:  .  Magnesium 500 MG TABS, , Disp: , Rfl:  .  modafinil (PROVIGIL) 100 MG tablet, Take 1 tablet by mouth once daily, Disp: 30 tablet, Rfl: 0 .  Multiple Vitamin (MULTI-VITAMIN DAILY PO), Take 1 tablet by mouth daily with breakfast. Women's, Disp: , Rfl:  .  mupirocin ointment (BACTROBAN) 2 %, Place 1 application into the nose 2 (two) times daily., Disp: 30 g, Rfl: 3 .  Omega-3 Fatty Acids (FISH OIL) 1000 MG CAPS, Take 1,000 mg by mouth 2 (two) times daily., Disp: , Rfl:  .  omeprazole (PRILOSEC) 40 MG capsule, , Disp: , Rfl:  .  traMADol (ULTRAM) 50 MG tablet, Take 1 tablet (50 mg total) by mouth daily as needed.  To last 90 days, Disp: 90 tablet, Rfl: 0 .  traZODone (DESYREL) 50 MG tablet, Take 1 tablet (50 mg total) by mouth at bedtime., Disp: 90 tablet, Rfl: 1 .  valsartan-hydrochlorothiazide (DIOVAN-HCT) 320-25 MG tablet, TAKE 1 TABLET DAILY, Disp: 90 tablet, Rfl: 0  Allergies  Allergen Reactions  . Percocet [Oxycodone-Acetaminophen] Anaphylaxis    bronchospasms  . Adhesive [Tape] Itching  . Allergy Relief  [Chlorpheniramine Maleate]     Heart flutters  . Other     Betadine=blisters skin  ,Cashew  Nuts,sunflower seeds-swelling and itching  . Oxycodone-Acetaminophen     Other reaction(s): Unknown  . Penicillin G     Other reaction(s): Unknown  . Penicillins Hives    Has patient had a PCN reaction causing immediate rash, facial/tongue/throat swelling, SOB or lightheadedness with hypotension: no Has patient had a PCN reaction causing severe rash involving mucus membranes or skin necrosis: no Has patient had a PCN reaction that required hospitalization happened in the hospital Has patient had a PCN reaction occurring within the last 10 years: no If all of the above answers are "NO", then may proceed with Cephalosporin use.   . Povidone Iodine     blisters blisters Other reaction(s): Unknown  . Diphenhydramine Rash     Review of Systems  Constitutional: Negative.   Eyes: Negative.   Respiratory: Negative.    Cardiovascular: Negative.   Gastrointestinal: Negative.   Genitourinary: Negative.   Musculoskeletal: Negative.   Skin: Negative.   Neurological: Negative.    ***  Objective  Vitals:   11/18/20 1307  BP: 134/80  Pulse: 88  Resp: 16  Temp: 98.5 F (36.9 C)  SpO2: 98%  Weight: 206 lb (93.4 kg)  Height: _0  (1.549 m)    Body mass index is 38.92 kg/m.  Physical Exam Constitutional:      Appearance: Normal appearance.  HENT:     Head: Normocephalic and atraumatic.     Mouth/Throat:     Mouth: Mucous membranes are moist.     Pharynx: Oropharynx is clear.  Eyes:     Conjunctiva/sclera: Conjunctivae normal.  Cardiovascular:     Rate and Rhythm: Normal rate and regular rhythm.  Pulmonary:     Effort: Pulmonary effort is normal.     Breath sounds: Normal breath sounds.  Abdominal:     General: There is no distension.     Palpations: Abdomen is soft.     Tenderness: There is no abdominal tenderness.  Musculoskeletal:     Right lower leg: No edema.     Left lower leg: No edema.  Skin:    General: Skin is warm and dry.  Neurological:     General:  No focal deficit present.     Mental Status: FRIMY UFFELMAN is alert. Mental status is at baseline.  Psychiatric:        Mood and Affect: Mood normal.        Behavior: Behavior normal.    Recent Results (from the past 2160 hour(s))  Lipid panel     Status: Abnormal   Collection Time: 10/27/20 11:54 AM  Result Value Ref Range   Cholesterol 204 (H) <200 mg/dL   HDL 65 > OR = 50 mg/dL   Triglycerides 105 <150 mg/dL   LDL Cholesterol (Calc) 118 (H) mg/dL (calc)    Comment: Reference range: <100 . Desirable range <100 mg/dL for primary prevention;   <70 mg/dL for patients with CHD or diabetic patients  with > or =  2 CHD risk factors. Marland Kitchen LDL-C is now calculated using the Martin-Hopkins  calculation, which is a validated novel method providing  better accuracy than the Friedewald equation in the  estimation of LDL-C.  Cresenciano Genre et al. Annamaria Helling. 5409;811(91): 2061-2068  (http://education.[facility].com/faq/FAQ164)    Total CHOL/HDL Ratio 3.1 <5.0 (calc)   Non-HDL Cholesterol (Calc) 139 (H) <130 mg/dL (calc)    Comment: For patients with diabetes plus 1 major ASCVD risk  factor, treating to a non-HDL-C goal of <100 mg/dL  (LDL-C of <70 mg/dL) is considered a therapeutic  option.   COMPLETE METABOLIC PANEL WITH GFR     Status: Abnormal   Collection Time: 10/27/20 11:54 AM  Result Value Ref Range   Glucose, Bld 89 65 - 99 mg/dL    Comment: .            Fasting reference interval .    BUN 19 7 - 25 mg/dL   Creat 0.71 0.50 - 1.03 mg/dL   eGFR 100 > OR = 60 mL/min/1.64m    Comment: The eGFR is based on the CKD-EPI 2021 equation. To calculate  the new eGFR from a previous Creatinine or Cystatin C result, go to https://www.kidney.org/professionals/ kdoqi/gfr%5Fcalculator    BUN/Creatinine Ratio NOT APPLICABLE 6 - 22 (calc)   Sodium 140 135 - 146 mmol/L   Potassium 3.7 3.5 - 5.3 mmol/L   Chloride 100 98 - 110 mmol/L   CO2 34 (H) 20 - 32 mmol/L   Calcium 9.1 8.6 - 10.4 mg/dL    Total Protein 6.2 6.1 - 8.1 g/dL   Albumin 4.0 3.6 - 5.1 g/dL   Globulin 2.2 1.9 - 3.7 g/dL (calc)   AG Ratio 1.8 1.0 - 2.5 (calc)   Total Bilirubin 0.7 0.2 - 1.2 mg/dL   Alkaline phosphatase (APISO) 92 37 - 153 U/L   AST 123 (H) 10 - 35 U/L   ALT 53 (H) 6 - 29 U/L  CBC with Differential/Platelet     Status: Abnormal   Collection Time: 10/27/20 11:54 AM  Result Value Ref Range   WBC 4.3 3.8 - 10.8 Thousand/uL   RBC 3.98 3.80 - 5.10 Million/uL   Hemoglobin 11.3 (L) 11.7 - 15.5 g/dL   HCT 35.8 35.0 - 45.0 %   MCV 89.9 80.0 - 100.0 fL   MCH 28.4 27.0 - 33.0 pg   MCHC 31.6 (L) 32.0 - 36.0 g/dL   RDW 12.4 11.0 - 15.0 %   Platelets 260 140 - 400 Thousand/uL   MPV 11.3 7.5 - 12.5 fL   Neutro Abs 2,430 1,500 - 7,800 cells/uL   Lymphs Abs 1,114 850 - 3,900 cells/uL   Absolute Monocytes 383 200 - 950 cells/uL   Eosinophils Absolute 353 15 - 500 cells/uL   Basophils Absolute 22 0 - 200 cells/uL   Neutrophils Relative % 56.5 %   Total Lymphocyte 25.9 %   Monocytes Relative 8.9 %   Eosinophils Relative 8.2 %   Basophils Relative 0.5 %  RPR     Status: None   Collection Time: 10/27/20 11:54 AM  Result Value Ref Range   RPR Ser Ql NON-REACTIVE NON-REACTIVE  HIV Antibody (routine testing w rflx)     Status: None   Collection Time: 10/27/20 11:54 AM  Result Value Ref Range   HIV 1&2 Ab, 4th Generation NON-REACTIVE NON-REACTIVE    Comment: HIV-1 antigen and HIV-1/HIV-2 antibodies were not detected. There is no laboratory evidence of HIV infection. .Marland KitchenPLEASE NOTE: This information has  been disclosed to you from records whose confidentiality may be protected by state law.  If your state requires such protection, then the state law prohibits you from making any further disclosure of the information without the specific written consent of the person to whom it pertains, or as otherwise permitted by law. A general authorization for the release of medical or other information is NOT  sufficient for this purpose. . For additional information please refer to http://education.[facility].com/faq/FAQ106 (This link is being provided for informational/ educational purposes only.) . Marland Kitchen The performance of this assay has not been clinically validated in patients less than 12 years old. .   Iron, TIBC and Ferritin Panel     Status: Abnormal   Collection Time: 10/27/20 11:54 AM  Result Value Ref Range   Iron 57 45 - 160 mcg/dL   TIBC 386 250 - 450 mcg/dL (calc)   %SAT 15 (L) 16 - 45 % (calc)   Ferritin 28 16 - 232 ng/mL  TEST AUTHORIZATION     Status: None   Collection Time: 10/27/20 11:54 AM  Result Value Ref Range   TEST NAME: IRON, TIBC AND FERRITIN PANEL    TEST CODE: [id number]    CLIENT CONTACT: CHRIS LUEBKE    REPORT ALWAYS MESSAGE SIGNATURE      Comment: . The laboratory testing on this patient was verbally requested or confirmed by the ordering physician or his or her authorized representative after contact with an employee of [employer]. Federal regulations require that we maintain on file written authorization for all laboratory testing.  Accordingly we are asking that the ordering physician or his or her authorized representative sign a copy of this report and promptly return it to the client service representative. . . Signature:____________________________________________________ . Please fax this signed page to [phone] or return it via your [employer] courier.      Fall Risk: Fall Risk  11/18/2020 10/27/2020 07/29/2020 04/08/2020 10/29/2019  Falls in the past year? 0 0 0 0 0  Number falls in past yr: 0 0 0 0 0  Injury with Fall? 0 0 0 - 0  Risk for fall due to : No Fall Risks No Fall Risks - - -  Follow up Falls prevention discussed Falls prevention discussed - Falls prevention discussed -    Assessment & Plan  1. Well adult exam: complete, health maintenance reviewed.   -USPSTF grade A and B recommendations reviewed  with patient; age-appropriate recommendations, preventive care, screening tests, etc discussed and encouraged; healthy living encouraged; see AVS for patient education given to patient -Discussed importance of 150 minutes of physical activity weekly, eat two servings of fish weekly, eat one serving of tree nuts ( cashews, pistachios, pecans, almonds.Marland Kitchen) every other day, eat 6 servings of fruit/vegetables daily and drink plenty of water and avoid sweet beverages.    2. Encounter for screening mammogram for malignant neoplasm of breast: Yearly mammogram ordered.   - MM 3D SCREEN BREAST BILATERAL; Future  -Reviewed Health Maintenance: yes

## 2020-11-18 ENCOUNTER — Encounter: Payer: Self-pay | Admitting: Internal Medicine

## 2020-11-18 ENCOUNTER — Other Ambulatory Visit: Payer: Self-pay

## 2020-11-18 ENCOUNTER — Encounter: Payer: 59 | Admitting: Certified Nurse Midwife

## 2020-11-18 ENCOUNTER — Ambulatory Visit: Payer: 59 | Admitting: Internal Medicine

## 2020-11-18 VITALS — BP 134/80 | HR 88 | Temp 98.5°F | Resp 16 | Ht 61.0 in | Wt 206.0 lb

## 2020-11-18 DIAGNOSIS — Z1231 Encounter for screening mammogram for malignant neoplasm of breast: Secondary | ICD-10-CM | POA: Diagnosis not present

## 2020-11-18 DIAGNOSIS — Z0000 Encounter for general adult medical examination without abnormal findings: Secondary | ICD-10-CM

## 2020-11-18 NOTE — Patient Instructions (Signed)
It was great seeing you today!  Plan discussed at today's visit: -Mammogram ordered today -Remember to get second Shingles vaccine  Follow up in: March 09, 2021  Take care and let us know if you have any questions or concerns prior to your next visit.  Dr. Caralee Ates

## 2020-11-18 NOTE — Progress Notes (Signed)
Name: Taylor Hurst   MRN: [id number]    DOB: 1965-01-20   Date:11/18/2020       Progress Note  Subjective  Chief Complaint  Annual Exam  HPI  Taylor Hurst is a 55 year old female here for annual CPE. Chronic medical conditions HTN, HLD, GERD, OA, MDD. She was just seen in this office on   Diet: doesn't eat bread, fatty foods. Tries to eat high fiber. Just drinks water, no soda. Exercise: on feet 8 hours a day at work, parks further away to walk a little more    [employer] Visit from 10/29/2019 in [facility]  AUDIT-C Score 0      Depression: Phq 9 is  positive Depression screen [facility] 2/9 11/18/2020 10/27/2020 07/29/2020 04/08/2020 10/29/2019  Decreased Interest 1 0 0 1 1  Down, Depressed, Hopeless 2 1 1 2 1   PHQ - 2 Score 3 1 1 3 2   Altered sleeping - 0 0 1 0  Tired, decreased energy - 0 0 0 0  Change in appetite - 1 0 2 1  Feeling bad or failure about yourself  - 0 0 2 0  Trouble concentrating - 0 0 0 0  Moving slowly or fidgety/restless - 0 0 0 0  Suicidal thoughts - 0 0 0 0  PHQ-9 Score - 2 1 8 3   Difficult doing work/chores - - - Somewhat difficult Not difficult at all  Some recent data might be hidden   Hypertension: BP Readings from Last 3 Encounters:  11/18/20 134/80  10/27/20 120/78  07/29/20 112/68   Obesity: Wt Readings from Last 3 Encounters:  11/18/20 206 lb (93.4 kg)  10/27/20 209 lb (94.8 kg)  07/29/20 210 lb (95.3 kg)   BMI Readings from Last 3 Encounters:  11/18/20 38.92 kg/m  10/27/20 39.49 kg/m  07/29/20 39.68 kg/m     Vaccines:  HPV: up to at age 16 , ask insurance if age between 32-45  Shingrix: 85-64 yo and ask insurance if covered when patient above 55 yo - patient had first dose of Shringrix vaccine and is scheduled the first week of December for the second dose Pneumonia: educated and discussed with patient. Flu: educated and discussed with patient.  Hep C Screening: 08/29/18 STD testing and prevention  (HIV/chl/gon/syphilis): 10/13/222 Intimate partner violence: negative Sexual History : currently sexually active  Menstrual History/LMP/Abnormal Bleeding: none Incontinence Symptoms: none  Breast cancer:  - Last Mammogram: 01/01/20 - BRCA gene screening: N/A  Osteoporosis: Discussed high calcium and vitamin D supplementation, weight bearing exercises  Cervical cancer screening: 11/12/19  Skin cancer: Discussed monitoring for atypical lesions  Colorectal cancer: 06/09/15   Lung cancer: Low Dose CT Chest recommended if Age 21-80 years, 20 pack-year currently smoking OR have quit w/in 15years. Patient does not qualify.   ECG: 11/12/14  Advanced Care Planning: A voluntary discussion about advance care planning including the explanation and discussion of advance directives.  Discussed health care proxy and Living will, and the patient was able to identify a health care proxy as son Taylor Hurst.  Patient does not have a living will at present time. If patient does have living will, I have requested they bring this to the clinic to be scanned in to their chart.  Lipids: Lab Results  Component Value Date   CHOL 204 (H) 10/27/2020   CHOL 185 10/29/2019   CHOL 218 (H) 08/29/2018   Lab Results  Component Value Date   HDL 65  10/27/2020   HDL 62 10/29/2019   HDL 61 08/29/2018   Lab Results  Component Value Date   LDLCALC 118 (H) 10/27/2020   LDLCALC 105 (H) 10/29/2019   LDLCALC 137 (H) 08/29/2018   Lab Results  Component Value Date   TRIG 105 10/27/2020   TRIG 90 10/29/2019   TRIG 98 08/29/2018   Lab Results  Component Value Date   CHOLHDL 3.1 10/27/2020   CHOLHDL 3.0 10/29/2019   CHOLHDL 3.6 08/29/2018   No results found for: LDLDIRECT  Glucose: Glucose, Bld  Date Value Ref Range Status  10/27/2020 89 65 - 99 mg/dL Final    Comment:    .            Fasting reference interval .   10/29/2019 92 65 - 99 mg/dL Final    Comment:    .            Fasting reference  interval .   08/29/2018 91 65 - 99 mg/dL Final    Comment:    .            Fasting reference interval .     Patient Active Problem List   Diagnosis Date Noted   Abnormal liver function tests 04/08/2020   Cholelithiasis 04/08/2020   Left upper quadrant pain 04/08/2020   Esophagitis 04/08/2020   Right upper quadrant pain 04/08/2020   Gastric ulcer 04/08/2020   Other esophagitis without bleeding 04/08/2020   Morbid (severe) obesity due to excess calories ([place]) 03/28/2017   Hyperglycemia 12/22/2015   Tendonitis, Achilles, right 01/11/2015   Anxiety disorder 12/07/2014   Complete rotator cuff tear 11/18/2014   Hypercholesterolemia 07/30/2014   Osteoarthritis of both knees 07/30/2014   Chronic constipation 08/29/2011   HTN (hypertension) 05/30/2011   Insomnia 05/30/2011   Obesity (BMI 30-39.9) 05/30/2011   GERD (gastroesophageal reflux disease) 05/30/2011    Past Surgical History:  Procedure Laterality Date   CHOLECYSTECTOMY  07/26/11   INTRAOPERATIVE CHOLANGIOGRAM  07/26/2011   Procedure: INTRAOPERATIVE CHOLANGIOGRAM;  Surgeon: Adin Hector, MD;  Location: [facility];  Service: General;  Laterality: N/A;   LIVER BIOPSY  07/26/2011   Procedure: LIVER BIOPSY;  Surgeon: Adin Hector, MD;  Location: [facility];  Service: General;  Laterality: N/A;  core liver biopsy    osteomilitis     right and left leg/surgery; right 1983; left 1988   osteomylitis  2641,5830   right leg, left leg   SHOULDER OPEN ROTATOR CUFF REPAIR Right 11/18/2014   Procedure: RIGHT SHOULDER MINI OPEN ROTATOR CUFF REPAIR SUBACROMIAL DECOMPRESSION  ;  Surgeon: Susa Day, MD;  Location: [facility];  Service: Orthopedics;  Laterality: Right;    Family History  Problem Relation Age of Onset   COPD Mother    Hypertension Mother    Hyperlipidemia Mother    Glaucoma Mother    Alcohol abuse Father    Colon cancer Father    Liver cancer Father    Cancer Maternal Aunt        breast   Breast cancer Paternal Aunt     Kidney failure Brother        He was waiting to get a kidney    Social History   Socioeconomic History   Marital status: Married    Spouse name: Not on file   Number of children: 2   Years of education: Not on file   Highest education level: Not on file  Occupational History   Not on file  Tobacco Use   Smoking status: Never   Smokeless tobacco: Never  Vaping Use   Vaping Use: Never used  Substance and Sexual Activity   Alcohol use: Yes    Alcohol/week: 0.0 standard drinks    Comment: occassionally   Drug use: No   Sexual activity: Yes    Birth control/protection: I.U.D.    Comment: Mirena  Other Topics Concern   Not on file  Social History Narrative   Not on file   Social Determinants of Health   Financial Resource Strain: Low Risk    Difficulty of Paying Living Expenses: Not hard at all  Food Insecurity: No Food Insecurity   Worried About Charity fundraiser in the Last Year: Never true   Arboriculturist in the Last Year: Never true  Transportation Needs: No Transportation Needs   Lack of Transportation (Medical): No   Lack of Transportation (Non-Medical): No  Physical Activity: Insufficiently Active   Days of Exercise per Week: 4 days   Minutes of Exercise per Session: 10 min  Stress: No Stress Concern Present   Feeling of Stress : Only a little  Social Connections: Not on file  Intimate Partner Violence: Not At Risk   Fear of Current or Ex-Partner: No   Emotionally Abused: No   Physically Abused: No   Sexually Abused: No     Current Outpatient Medications:    amLODipine (NORVASC) 10 MG tablet, TAKE 1 TABLET DAILY, Disp: 90 tablet, Rfl: 1   ARIPiprazole (ABILIFY) 2 MG tablet, Take 1 tablet by mouth in the evening, Disp: 30 tablet, Rfl: 0   aspirin EC 81 MG tablet, Take 1 tablet (81 mg total) by mouth daily., Disp: 30 tablet, Rfl: 0   b complex vitamins tablet, Take 1 tablet by mouth daily., Disp: , Rfl:    BIOTIN PO, Take 10,000 mg by mouth daily.,  Disp: , Rfl:    Black Cohosh 540 MG CAPS, Take 540 mg by mouth daily. , Disp: , Rfl:    colestipol (COLESTID) 1 g tablet, Take 2 g by mouth 2 (two) times daily., Disp: , Rfl:    diclofenac sodium (VOLTAREN) 1 % GEL, Apply 4 g topically 4 (four) times daily., Disp: 300 g, Rfl: 1   escitalopram (LEXAPRO) 10 MG tablet, TAKE 1 TABLET DAILY, Disp: 90 tablet, Rfl: 1   FIBER PO, Take 5 tablets by mouth 2 (two) times daily., Disp: , Rfl:    hydrOXYzine (ATARAX/VISTARIL) 10 MG tablet, TAKE 1 TABLET DAILY AS NEEDED, Disp: 90 tablet, Rfl: 0   ketotifen (ZADITOR) 0.025 % ophthalmic solution, 1 drop 2 (two) times daily., Disp: , Rfl:    levonorgestrel (MIRENA) 20 MCG/24HR IUD, 1 each by Intrauterine route once. , Disp: , Rfl:    Magnesium 500 MG TABS, , Disp: , Rfl:    modafinil (PROVIGIL) 100 MG tablet, Take 1 tablet by mouth once daily, Disp: 30 tablet, Rfl: 0   Multiple Vitamin (MULTI-VITAMIN DAILY PO), Take 1 tablet by mouth daily with breakfast. Women's, Disp: , Rfl:    mupirocin ointment (BACTROBAN) 2 %, Place 1 application into the nose 2 (two) times daily., Disp: 30 g, Rfl: 3   Omega-3 Fatty Acids (FISH OIL) 1000 MG CAPS, Take 1,000 mg by mouth 2 (two) times daily., Disp: , Rfl:    omeprazole (PRILOSEC) 40 MG capsule, , Disp: , Rfl:    traMADol (ULTRAM) 50 MG tablet, Take 1 tablet (50 mg total) by mouth daily as needed.  To last 90 days, Disp: 90 tablet, Rfl: 0   traZODone (DESYREL) 50 MG tablet, Take 1 tablet (50 mg total) by mouth at bedtime., Disp: 90 tablet, Rfl: 1   valsartan-hydrochlorothiazide (DIOVAN-HCT) 320-25 MG tablet, TAKE 1 TABLET DAILY, Disp: 90 tablet, Rfl: 0  Allergies  Allergen Reactions   Percocet [Oxycodone-Acetaminophen] Anaphylaxis    bronchospasms   Adhesive [Tape] Itching   Allergy Relief  [Chlorpheniramine Maleate]     Heart flutters   Other     Betadine=blisters skin  ,Cashew Nuts,sunflower seeds-swelling and itching   Oxycodone-Acetaminophen     Other reaction(s):  Unknown   Penicillin G     Other reaction(s): Unknown   Penicillins Hives    Has patient had a PCN reaction causing immediate rash, facial/tongue/throat swelling, SOB or lightheadedness with hypotension: no Has patient had a PCN reaction causing severe rash involving mucus membranes or skin necrosis: no Has patient had a PCN reaction that required hospitalization happened in the hospital Has patient had a PCN reaction occurring within the last 10 years: no If all of the above answers are "NO", then may proceed with Cephalosporin use.    Povidone Iodine     blisters blisters Other reaction(s): Unknown   Diphenhydramine Rash     Review of Systems  Constitutional: Negative.   Eyes: Negative.   Respiratory: Negative.    Cardiovascular: Negative.   Gastrointestinal: Negative.   Genitourinary: Negative.   Musculoskeletal: Negative.   Skin: Negative.   Neurological: Negative.     Objective  Vitals:   11/18/20 1307  BP: 134/80  Pulse: 88  Resp: 16  Temp: 98.5 F (36.9 C)  SpO2: 98%  Weight: 206 lb (93.4 kg)  Height: 5' 1"  (1.549 m)    Body mass index is 38.92 kg/m.  Physical Exam Constitutional:      Appearance: Normal appearance.  HENT:     Head: Normocephalic and atraumatic.     Mouth/Throat:     Mouth: Mucous membranes are moist.     Pharynx: Oropharynx is clear.  Eyes:     Conjunctiva/sclera: Conjunctivae normal.  Cardiovascular:     Rate and Rhythm: Normal rate and regular rhythm.  Pulmonary:     Effort: Pulmonary effort is normal.     Breath sounds: Normal breath sounds.  Abdominal:     General: There is no distension.     Palpations: Abdomen is soft.     Tenderness: There is no abdominal tenderness.  Musculoskeletal:     Right lower leg: No edema.     Left lower leg: No edema.  Skin:    General: Skin is warm and dry.  Neurological:     General: No focal deficit present.     Mental Status: Taylor Hurst is alert. Mental status is at baseline.   Psychiatric:        Mood and Affect: Mood normal.        Behavior: Behavior normal.    Recent Results (from the past 2160 hour(s))  Lipid panel     Status: Abnormal   Collection Time: 10/27/20 11:54 AM  Result Value Ref Range   Cholesterol 204 (H) <200 mg/dL   HDL 65 > OR = 50 mg/dL   Triglycerides 105 <150 mg/dL   LDL Cholesterol (Calc) 118 (H) mg/dL (calc)    Comment: Reference range: <100 . Desirable range <100 mg/dL for primary prevention;   <70 mg/dL for patients with CHD or diabetic patients  with > or = 2  CHD risk factors. Marland Kitchen LDL-C is now calculated using the Martin-Hopkins  calculation, which is a validated novel method providing  better accuracy than the Friedewald equation in the  estimation of LDL-C.  Cresenciano Genre et al. Annamaria Helling. 1610;960(45): 2061-2068  (http://education.[facility].com/faq/FAQ164)    Total CHOL/HDL Ratio 3.1 <5.0 (calc)   Non-HDL Cholesterol (Calc) 139 (H) <130 mg/dL (calc)    Comment: For patients with diabetes plus 1 major ASCVD risk  factor, treating to a non-HDL-C goal of <100 mg/dL  (LDL-C of <70 mg/dL) is considered a therapeutic  option.   COMPLETE METABOLIC PANEL WITH GFR     Status: Abnormal   Collection Time: 10/27/20 11:54 AM  Result Value Ref Range   Glucose, Bld 89 65 - 99 mg/dL    Comment: .            Fasting reference interval .    BUN 19 7 - 25 mg/dL   Creat 0.71 0.50 - 1.03 mg/dL   eGFR 100 > OR = 60 mL/min/1.46m    Comment: The eGFR is based on the CKD-EPI 2021 equation. To calculate  the new eGFR from a previous Creatinine or Cystatin C result, go to https://www.kidney.org/professionals/ kdoqi/gfr%5Fcalculator    BUN/Creatinine Ratio NOT APPLICABLE 6 - 22 (calc)   Sodium 140 135 - 146 mmol/L   Potassium 3.7 3.5 - 5.3 mmol/L   Chloride 100 98 - 110 mmol/L   CO2 34 (H) 20 - 32 mmol/L   Calcium 9.1 8.6 - 10.4 mg/dL   Total Protein 6.2 6.1 - 8.1 g/dL   Albumin 4.0 3.6 - 5.1 g/dL   Globulin 2.2 1.9 - 3.7 g/dL  (calc)   AG Ratio 1.8 1.0 - 2.5 (calc)   Total Bilirubin 0.7 0.2 - 1.2 mg/dL   Alkaline phosphatase (APISO) 92 37 - 153 U/L   AST 123 (H) 10 - 35 U/L   ALT 53 (H) 6 - 29 U/L  CBC with Differential/Platelet     Status: Abnormal   Collection Time: 10/27/20 11:54 AM  Result Value Ref Range   WBC 4.3 3.8 - 10.8 Thousand/uL   RBC 3.98 3.80 - 5.10 Million/uL   Hemoglobin 11.3 (L) 11.7 - 15.5 g/dL   HCT 35.8 35.0 - 45.0 %   MCV 89.9 80.0 - 100.0 fL   MCH 28.4 27.0 - 33.0 pg   MCHC 31.6 (L) 32.0 - 36.0 g/dL   RDW 12.4 11.0 - 15.0 %   Platelets 260 140 - 400 Thousand/uL   MPV 11.3 7.5 - 12.5 fL   Neutro Abs 2,430 1,500 - 7,800 cells/uL   Lymphs Abs 1,114 850 - 3,900 cells/uL   Absolute Monocytes 383 200 - 950 cells/uL   Eosinophils Absolute 353 15 - 500 cells/uL   Basophils Absolute 22 0 - 200 cells/uL   Neutrophils Relative % 56.5 %   Total Lymphocyte 25.9 %   Monocytes Relative 8.9 %   Eosinophils Relative 8.2 %   Basophils Relative 0.5 %  RPR     Status: None   Collection Time: 10/27/20 11:54 AM  Result Value Ref Range   RPR Ser Ql NON-REACTIVE NON-REACTIVE  HIV Antibody (routine testing w rflx)     Status: None   Collection Time: 10/27/20 11:54 AM  Result Value Ref Range   HIV 1&2 Ab, 4th Generation NON-REACTIVE NON-REACTIVE    Comment: HIV-1 antigen and HIV-1/HIV-2 antibodies were not detected. There is no laboratory evidence of HIV infection. .Marland KitchenPLEASE NOTE: This information has been  disclosed to you from records whose confidentiality may be protected by state law.  If your state requires such protection, then the state law prohibits you from making any further disclosure of the information without the specific written consent of the person to whom it pertains, or as otherwise permitted by law. A general authorization for the release of medical or other information is NOT sufficient for this purpose. . For additional information please refer  to http://education.[facility].com/faq/FAQ106 (This link is being provided for informational/ educational purposes only.) . Marland Kitchen The performance of this assay has not been clinically validated in patients less than 52 years old. .   Iron, TIBC and Ferritin Panel     Status: Abnormal   Collection Time: 10/27/20 11:54 AM  Result Value Ref Range   Iron 57 45 - 160 mcg/dL   TIBC 386 250 - 450 mcg/dL (calc)   %SAT 15 (L) 16 - 45 % (calc)   Ferritin 28 16 - 232 ng/mL  TEST AUTHORIZATION     Status: None   Collection Time: 10/27/20 11:54 AM  Result Value Ref Range   TEST NAME: IRON, TIBC AND FERRITIN PANEL    TEST CODE: [id number]    CLIENT CONTACT: CHRIS LUEBKE    REPORT ALWAYS MESSAGE SIGNATURE      Comment: . The laboratory testing on this patient was verbally requested or confirmed by the ordering physician or his or her authorized representative after contact with an employee of [employer]. Federal regulations require that we maintain on file written authorization for all laboratory testing.  Accordingly we are asking that the ordering physician or his or her authorized representative sign a copy of this report and promptly return it to the client service representative. . . Signature:____________________________________________________ . Please fax this signed page to [phone] or return it via your [employer] courier.      Fall Risk: Fall Risk  11/18/2020 10/27/2020 07/29/2020 04/08/2020 10/29/2019  Falls in the past year? 0 0 0 0 0  Number falls in past yr: 0 0 0 0 0  Injury with Fall? 0 0 0 - 0  Risk for fall due to : No Fall Risks No Fall Risks - - -  Follow up Falls prevention discussed Falls prevention discussed - Falls prevention discussed -    Assessment & Plan  1. Well adult exam: complete, health maintenance reviewed.   -USPSTF grade A and B recommendations reviewed with patient; age-appropriate recommendations, preventive care, screening  tests, etc discussed and encouraged; healthy living encouraged; see AVS for patient education given to patient -Discussed importance of 150 minutes of physical activity weekly, eat two servings of fish weekly, eat one serving of tree nuts ( cashews, pistachios, pecans, almonds.Marland Kitchen) every other day, eat 6 servings of fruit/vegetables daily and drink plenty of water and avoid sweet beverages.    2. Encounter for screening mammogram for malignant neoplasm of breast: Yearly mammogram ordered.   - MM 3D SCREEN BREAST BILATERAL; Future  -Reviewed Health Maintenance: yes

## 2020-12-02 ENCOUNTER — Other Ambulatory Visit: Payer: Self-pay | Admitting: Family Medicine

## 2020-12-02 DIAGNOSIS — F320 Major depressive disorder, single episode, mild: Secondary | ICD-10-CM

## 2020-12-02 DIAGNOSIS — M170 Bilateral primary osteoarthritis of knee: Secondary | ICD-10-CM

## 2020-12-02 DIAGNOSIS — G4726 Circadian rhythm sleep disorder, shift work type: Secondary | ICD-10-CM

## 2020-12-06 ENCOUNTER — Other Ambulatory Visit: Payer: Self-pay | Admitting: Family Medicine

## 2020-12-06 DIAGNOSIS — G4726 Circadian rhythm sleep disorder, shift work type: Secondary | ICD-10-CM

## 2020-12-19 ENCOUNTER — Encounter: Payer: Self-pay | Admitting: Family Medicine

## 2020-12-19 ENCOUNTER — Other Ambulatory Visit: Payer: Self-pay | Admitting: Family Medicine

## 2020-12-19 DIAGNOSIS — G4726 Circadian rhythm sleep disorder, shift work type: Secondary | ICD-10-CM

## 2020-12-19 MED ORDER — MODAFINIL 100 MG PO TABS: 100.0000 mg | ORAL_TABLET | Freq: Every day | ORAL | 0 refills | Status: DC

## 2021-01-03 ENCOUNTER — Other Ambulatory Visit: Payer: Self-pay | Admitting: Family Medicine

## 2021-01-03 DIAGNOSIS — I10 Essential (primary) hypertension: Secondary | ICD-10-CM

## 2021-01-03 NOTE — Telephone Encounter (Signed)
Requested Prescriptions  Pending Prescriptions Disp Refills   valsartan-hydrochlorothiazide (DIOVAN-HCT) 320-25 MG tablet [Pharmacy Med Name: VALSARTAN/HCTZ TABS 320/25MG ] 90 tablet 0    Sig: TAKE 1 TABLET DAILY     Cardiovascular: ARB + Diuretic Combos Passed - 01/03/2021 12:21 AM      Passed - K in normal range and within 180 days    Potassium  Date Value Ref Range Status  10/27/2020 3.7 3.5 - 5.3 mmol/L Final         Passed - Na in normal range and within 180 days    Sodium  Date Value Ref Range Status  10/27/2020 140 135 - 146 mmol/L Final  09/03/2014 144 134 - 144 mmol/L Final         Passed - Cr in normal range and within 180 days    Creat  Date Value Ref Range Status  10/27/2020 0.71 0.50 - 1.03 mg/dL Final         Passed - Ca in normal range and within 180 days    Calcium  Date Value Ref Range Status  10/27/2020 9.1 8.6 - 10.4 mg/dL Final         Passed - Patient is not pregnant      Passed - Last BP in normal range    BP Readings from Last 1 Encounters:  11/18/20 134/80         Passed - Valid encounter within last 6 months    Recent Outpatient Visits          1 month ago Well adult exam   [facility] Margarita Mail, DO   2 months ago Dyslipidemia   [facility] Alba Cory, MD   5 months ago Essential hypertension   [facility] Alba Cory, MD   9 months ago Essential hypertension   [facility] Alba Cory, MD   1 year ago Mild major depression [facility])   [facility] Alba Cory, MD      Future Appointments            In 2 months Carlynn Purl, Danna Hefty, MD [facility]

## 2021-01-16 ENCOUNTER — Other Ambulatory Visit: Payer: Self-pay | Admitting: Family Medicine

## 2021-01-16 DIAGNOSIS — F411 Generalized anxiety disorder: Secondary | ICD-10-CM

## 2021-03-02 NOTE — Progress Notes (Signed)
Name: Taylor GibsonJustina F Hurst   MRN: [id number]    DOB: 08/12/1965   Date:03/03/2021       Progress Note  Subjective  Chief Complaint  Follow Up  HPI  HTN: She denies dizziness, chest pain, headaches, SOB  or palpitation. Compliant with medications. BP is at goal, edema is not as significant now    Morbid Obesity: she states she developed obesity after second child, went from 167 lbs to 225 lbs during pregnancy. Since Fall of 2019 she changed her diet, and we added Ozempic, it was  curbing her appetite her weight was improving , no longer medication because of cost, Saxenda not covered She has BMI above 35 with co-morbidities such as OA ,HTN and dyslipidemia She is trying to follow a low carb diet  Since last year around this time she has lost 6 lbs    OA: she is taking Tylenol during the day and tramadol qhs, she has been taking for many years. She continues to have daily aching pain, seen by Ortho had steroid injections in Nov 2021, has bone spurs, states worse when she gets home from work. She states pain right now is controlled but average 6/10 or higher after work, improves with Tramadol    GAD/MDD: she is off alprazolam , under control with  Lexapro, also taking Hydroxyzine prn and we added Abilify Summer 2022 the combination is helping her mood She has a long history of depression but states Lexapro and Abilify have helped her a lot . 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 Trazodone  is helping her stay and fall asleep but feels tired during  the day. Stable and needs refills. Taking Armodafinil since the Summer 22  and has noticed more alertness during working hours.   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 Unchanged   GERD/esophagitis/post-cholecystectomy diarrhea : under the  care [facility]  [facility], and is now on Nexium and Colestipol, she states bowel movements are controlled now and no side effects of medications   Patient Active Problem List   Diagnosis Date Noted   Abnormal liver function tests 04/08/2020   Cholelithiasis 04/08/2020   Left upper quadrant pain 04/08/2020   Esophagitis 04/08/2020   Right upper quadrant pain 04/08/2020   Gastric ulcer 04/08/2020   Other esophagitis without bleeding 04/08/2020   Morbid (severe) obesity due to excess calories (HCC) 03/28/2017   Hyperglycemia 12/22/2015   Tendonitis, Achilles, right 01/11/2015   Anxiety disorder 12/07/2014   Complete rotator cuff tear 11/18/2014   Hypercholesterolemia 07/30/2014   Osteoarthritis of both knees 07/30/2014   Chronic constipation 08/29/2011   HTN (hypertension) 05/30/2011   Insomnia 05/30/2011   Obesity (BMI 30-39.9) 05/30/2011   GERD (gastroesophageal reflux disease) 05/30/2011    Past Surgical History:  Procedure Laterality Date   CHOLECYSTECTOMY  07/26/11   INTRAOPERATIVE CHOLANGIOGRAM  07/26/2011   Procedure: INTRAOPERATIVE CHOLANGIOGRAM;  Surgeon: Ardeth SportsmanSteven C. Gross, MD;  Location: [facility];  Service: General;  Laterality: N/A;   LIVER BIOPSY  07/26/2011   Procedure: LIVER BIOPSY;  Surgeon: Ardeth SportsmanSteven C. Gross, MD;  Location: [facility];  Service: General;  Laterality: N/A;  core liver biopsy    osteomilitis     right and  left leg/surgery; right 1983; left 1988   osteomylitis  [id number]   right leg, left leg   SHOULDER OPEN ROTATOR CUFF REPAIR Right 11/18/2014   Procedure: RIGHT SHOULDER MINI OPEN ROTATOR CUFF REPAIR SUBACROMIAL DECOMPRESSION  ;  Surgeon: Jene Every, MD;  Location: [facility];  Service: Orthopedics;  Laterality: Right;    Family History  Problem Relation Age of Onset   COPD Mother    Hypertension Mother    Hyperlipidemia Mother    Glaucoma Mother    Alcohol abuse Father    Colon cancer Father    Liver cancer Father    Cancer Maternal Aunt         breast   Breast cancer Paternal Aunt    Kidney failure Brother        He was waiting to get a kidney    Social History   Tobacco Use   Smoking status: Never   Smokeless tobacco: Never  Substance Use Topics   Alcohol use: Yes    Alcohol/week: 0.0 standard drinks    Comment: occassionally     Current Outpatient Medications:    amLODipine (NORVASC) 10 MG tablet, TAKE 1 TABLET DAILY, Disp: 90 tablet, Rfl: 1   ARIPiprazole (ABILIFY) 2 MG tablet, Take 1 tablet by mouth in the evening, Disp: 30 tablet, Rfl: 0   aspirin EC 81 MG tablet, Take 1 tablet (81 mg total) by mouth daily., Disp: 30 tablet, Rfl: 0   b complex vitamins tablet, Take 1 tablet by mouth daily., Disp: , Rfl:    BIOTIN PO, Take 10,000 mg by mouth daily., Disp: , Rfl:    colestipol (COLESTID) 1 g tablet, Take 2 g by mouth 2 (two) times daily., Disp: , Rfl:    diclofenac sodium (VOLTAREN) 1 % GEL, Apply 4 g topically 4 (four) times daily., Disp: 300 g, Rfl: 1   escitalopram (LEXAPRO) 10 MG tablet, TAKE 1 TABLET DAILY, Disp: 90 tablet, Rfl: 1   FIBER PO, Take 5 tablets by mouth 2 (two) times daily., Disp: , Rfl:    hydrOXYzine (ATARAX) 10 MG tablet, TAKE 1 TABLET DAILY AS NEEDED, Disp: 90 tablet, Rfl: 3   ketotifen (ZADITOR) 0.025 % ophthalmic solution, 1 drop 2 (two) times daily., Disp: , Rfl:    levonorgestrel (MIRENA) 20 MCG/24HR IUD, 1 each by Intrauterine route once. , Disp: , Rfl:    Magnesium 500 MG TABS, , Disp: , Rfl:    modafinil (PROVIGIL) 100 MG tablet, Take 1 tablet (100 mg total) by mouth daily., Disp: 90 tablet, Rfl: 0   Multiple Vitamin (MULTI-VITAMIN DAILY PO), Take 1 tablet by mouth daily with breakfast. Women's, Disp: , Rfl:    mupirocin ointment (BACTROBAN) 2 %, Place 1 application into the nose 2 (two) times daily., Disp: 30 g, Rfl: 3   Omega-3 Fatty Acids (FISH OIL) 1000 MG CAPS, Take 1,000 mg by mouth 2 (two) times daily., Disp: , Rfl:    omeprazole (PRILOSEC) 40 MG capsule, , Disp: , Rfl:     traMADol (ULTRAM) 50 MG tablet, Take 1 tablet (50 mg total) by mouth daily as needed. To last 90 days, Disp: 90 tablet, Rfl: 0   traZODone (DESYREL) 50 MG tablet, Take 1 tablet (50 mg total) by mouth at bedtime., Disp: 90 tablet, Rfl: 1   valsartan-hydrochlorothiazide (DIOVAN-HCT) 320-25 MG tablet, TAKE 1 TABLET DAILY, Disp: 90 tablet, Rfl: 0   Black Cohosh 540 MG CAPS, Take 540 mg by mouth daily. , Disp: , Rfl:  Allergies  Allergen Reactions   Percocet [Oxycodone-Acetaminophen] Anaphylaxis    bronchospasms   Adhesive [Tape] Itching   Allergy Relief  [Chlorpheniramine Maleate]     Heart flutters   Other     Betadine=blisters skin  ,Cashew Nuts,sunflower seeds-swelling and itching   Oxycodone-Acetaminophen     Other reaction(s): Unknown   Penicillin G     Other reaction(s): Unknown   Penicillins Hives    Has patient had a PCN reaction causing immediate rash, facial/tongue/throat swelling, SOB or lightheadedness with hypotension: no Has patient had a PCN reaction causing severe rash involving mucus membranes or skin necrosis: no Has patient had a PCN reaction that required hospitalization happened in the hospital Has patient had a PCN reaction occurring within the last 10 years: no If all of the above answers are "NO", then may proceed with Cephalosporin use.    Povidone Iodine     blisters blisters Other reaction(s): Unknown   Diphenhydramine Rash    I personally reviewed active problem list, medication list, allergies, family history, social history, health maintenance with the patient/caregiver today.   ROS  Constitutional: Negative for fever or weight change.  Respiratory: Negative for cough and shortness of breath.   Cardiovascular: Negative for chest pain or palpitations.  Gastrointestinal: Negative for abdominal pain, no bowel changes.  Musculoskeletal: Negative for gait problem or joint swelling.  Skin: Negative for rash.  Neurological: Negative for dizziness or  headache.  No other specific complaints in a complete review of systems (except as listed in HPI above).   Objective  Vitals:   03/03/21 1438  BP: 130/78  Pulse: 84  Resp: 16  Temp: 98.2 F (36.8 C)  TempSrc: Oral  SpO2: 97%  Weight: 202 lb 9.6 oz (91.9 kg)  Height: 5\' 1"  (1.549 m)    Body mass index is 38.28 kg/m.  Physical Exam  Constitutional: Patient appears well-developed and well-nourished. Obese  No distress.  HEENT: head atraumatic, normocephalic, pupils equal and reactive to light,  neck supple Cardiovascular: Normal rate, regular rhythm and normal heart sounds.  No murmur heard. No BLE edema. Pulmonary/Chest: Effort normal and breath sounds normal. No respiratory distress. Abdominal: Soft.  There is no tenderness. Psychiatric: Patient has a normal mood and affect. behavior is normal. Judgment and thought content normal.   PHQ2/9: Depression screen [facility] 2/9 03/03/2021 11/18/2020 10/27/2020 07/29/2020 04/08/2020  Decreased Interest 0 1 0 0 1  Down, Depressed, Hopeless 0 2 1 1 2   PHQ - 2 Score 0 3 1 1 3   Altered sleeping 0 - 0 0 1  Tired, decreased energy 0 - 0 0 0  Change in appetite 0 - 1 0 2  Feeling bad or failure about yourself  0 - 0 0 2  Trouble concentrating 0 - 0 0 0  Moving slowly or fidgety/restless 0 - 0 0 0  Suicidal thoughts 0 - 0 0 0  PHQ-9 Score 0 - 2 1 8   Difficult doing work/chores Not difficult at all - - - Somewhat difficult  Some recent data might be hidden    phq 9 is negative   Fall Risk: Fall Risk  03/03/2021 11/18/2020 10/27/2020 07/29/2020 04/08/2020  Falls in the past year? - 0 0 0 0  Number falls in past yr: 0 0 0 0 0  Injury with Fall? 0 0 0 0 -  Risk for fall due to : No Fall Risks No Fall Risks No Fall Risks - -  Follow up Falls prevention discussed  Falls prevention discussed Falls prevention discussed - Falls prevention discussed      Functional Status Survey: Is the patient deaf or have difficulty hearing?: No Does the  patient have difficulty seeing, even when wearing glasses/contacts?: No Does the patient have difficulty concentrating, remembering, or making decisions?: No Does the patient have difficulty walking or climbing stairs?: No Does the patient have difficulty dressing or bathing?: No Does the patient have difficulty doing errands alone such as visiting a doctor's office or shopping?: No    Assessment & Plan  1. Mild major depression (HCC)  - escitalopram (LEXAPRO) 10 MG tablet; Take 1 tablet (10 mg total) by mouth daily.  Dispense: 90 tablet; Refill: 1 - ARIPiprazole (ABILIFY) 2 MG tablet; Take 1 tablet (2 mg total) by mouth every evening.  Dispense: 90 tablet; Refill: 1  2. Elevated liver function tests   3. Anemia, chronic disease   4. Generalized anxiety disorder  - escitalopram (LEXAPRO) 10 MG tablet; Take 1 tablet (10 mg total) by mouth daily.  Dispense: 90 tablet; Refill: 1  5. Morbid obesity (HCC)   6. Primary osteoarthritis of both knees  - traMADol (ULTRAM) 50 MG tablet; Take 1 tablet (50 mg total) by mouth daily as needed. To last 90 days  Dispense: 90 tablet; Refill: 0  7. Gastroesophageal reflux disease without esophagitis   8. Shift work sleep disorder  - modafinil (PROVIGIL) 100 MG tablet; Take 1 tablet (100 mg total) by mouth daily.  Dispense: 90 tablet; Refill: 1 - traZODone (DESYREL) 50 MG tablet; Take 1 tablet (50 mg total) by mouth at bedtime.  Dispense: 90 tablet; Refill: 1  9. Essential hypertension  - valsartan-hydrochlorothiazide (DIOVAN-HCT) 320-25 MG tablet; Take 1 tablet by mouth daily.  Dispense: 90 tablet; Refill: 1 - amLODipine (NORVASC) 10 MG tablet; Take 1 tablet (10 mg total) by mouth daily.  Dispense: 90 tablet; Refill: 1  10. Dyslipidemia   11. History of gastric ulcer   12. Diarrhea following gastrointestinal surgery

## 2021-03-03 ENCOUNTER — Ambulatory Visit (INDEPENDENT_AMBULATORY_CARE_PROVIDER_SITE_OTHER): Payer: 59 | Admitting: Family Medicine

## 2021-03-03 ENCOUNTER — Other Ambulatory Visit: Payer: Self-pay

## 2021-03-03 ENCOUNTER — Encounter: Payer: Self-pay | Admitting: Family Medicine

## 2021-03-03 VITALS — BP 130/78 | HR 84 | Temp 98.2°F | Resp 16 | Ht 61.0 in | Wt 202.6 lb

## 2021-03-03 DIAGNOSIS — R7989 Other specified abnormal findings of blood chemistry: Secondary | ICD-10-CM | POA: Diagnosis not present

## 2021-03-03 DIAGNOSIS — D638 Anemia in other chronic diseases classified elsewhere: Secondary | ICD-10-CM

## 2021-03-03 DIAGNOSIS — K219 Gastro-esophageal reflux disease without esophagitis: Secondary | ICD-10-CM

## 2021-03-03 DIAGNOSIS — I10 Essential (primary) hypertension: Secondary | ICD-10-CM

## 2021-03-03 DIAGNOSIS — Z8711 Personal history of peptic ulcer disease: Secondary | ICD-10-CM

## 2021-03-03 DIAGNOSIS — Z23 Encounter for immunization: Secondary | ICD-10-CM

## 2021-03-03 DIAGNOSIS — F411 Generalized anxiety disorder: Secondary | ICD-10-CM | POA: Diagnosis not present

## 2021-03-03 DIAGNOSIS — E785 Hyperlipidemia, unspecified: Secondary | ICD-10-CM

## 2021-03-03 DIAGNOSIS — M170 Bilateral primary osteoarthritis of knee: Secondary | ICD-10-CM

## 2021-03-03 DIAGNOSIS — F320 Major depressive disorder, single episode, mild: Secondary | ICD-10-CM | POA: Diagnosis not present

## 2021-03-03 DIAGNOSIS — Z9889 Other specified postprocedural states: Secondary | ICD-10-CM

## 2021-03-03 DIAGNOSIS — R197 Diarrhea, unspecified: Secondary | ICD-10-CM

## 2021-03-03 DIAGNOSIS — G4726 Circadian rhythm sleep disorder, shift work type: Secondary | ICD-10-CM

## 2021-03-03 MED ORDER — MODAFINIL 100 MG PO TABS: 100.0000 mg | ORAL_TABLET | Freq: Every day | ORAL | 1 refills | Status: DC

## 2021-03-03 MED ORDER — TRAZODONE HCL 50 MG PO TABS: 50.0000 mg | ORAL_TABLET | Freq: Every day | ORAL | 1 refills | Status: DC

## 2021-03-03 MED ORDER — VALSARTAN-HYDROCHLOROTHIAZIDE 320-25 MG PO TABS: 1.0000 | ORAL_TABLET | Freq: Every day | ORAL | 1 refills | Status: DC

## 2021-03-03 MED ORDER — ESCITALOPRAM OXALATE 10 MG PO TABS: 10.0000 mg | ORAL_TABLET | Freq: Every day | ORAL | 1 refills | Status: DC

## 2021-03-03 MED ORDER — ARIPIPRAZOLE 2 MG PO TABS: 2.0000 mg | ORAL_TABLET | Freq: Every evening | ORAL | 1 refills | Status: DC

## 2021-03-03 MED ORDER — AMLODIPINE BESYLATE 10 MG PO TABS: 10.0000 mg | ORAL_TABLET | Freq: Every day | ORAL | 1 refills | Status: DC

## 2021-03-03 MED ORDER — TRAMADOL HCL 50 MG PO TABS: 50.0000 mg | ORAL_TABLET | Freq: Every day | ORAL | 0 refills | Status: DC | PRN

## 2021-03-09 ENCOUNTER — Ambulatory Visit: Payer: 59 | Admitting: Family Medicine

## 2021-04-28 ENCOUNTER — Ambulatory Visit
Admission: RE | Admit: 2021-04-28 | Discharge: 2021-04-28 | Disposition: A | Discharge status: Home / Self Care | Payer: 59 | Source: Ambulatory Visit | Attending: Internal Medicine | Admitting: Internal Medicine

## 2021-04-28 DIAGNOSIS — Z0000 Encounter for general adult medical examination without abnormal findings: Secondary | ICD-10-CM

## 2021-04-28 DIAGNOSIS — Z1231 Encounter for screening mammogram for malignant neoplasm of breast: Secondary | ICD-10-CM

## 2021-08-02 NOTE — Progress Notes (Signed)
Name: Taylor Hurst   MRN: [id number]    DOB: 1965-08-13   Date:08/03/2021       Progress Note  Subjective  Chief Complaint  Follow up   HPI  HTN: She denies dizziness, chest pain, headaches, SOB  or palpitation. Compliant with medications. BP is towards low end of normal but no orthostatic changes    Morbid Obesity: she states she developed obesity after second child, went from 167 lbs to 225 lbs during pregnancy. Since Fall of 2019 she changed her diet, and we added Ozempic, it was  curbing her appetite her weight was improving , no longer taking medication because of cost, Saxenda not covered She has BMI above 35 with co-morbidities such as OA , HTN and dyslipidemia  Her weight is going up again, she states not compliant with diet like she was but will try to lose weight again    OA: she is taking Tylenol during the day and tramadol qhs, she has been taking for many years. She continues to have daily aching pain, seen by Ortho had steroid injections in Nov 2021, has bone spurs, states worse when she gets home from work. She states pain right now is controlled but average 6/10 or higher after work, improves with Tramadol Pain right now is 4/10 but at the end of her shift it can go up to 8/10. She was recently seen by Ortho   GAD/MDD: she is off alprazolam , under control with  Lexapro, also taking Hydroxyzine prn and we added Abilify Summer 2022 the combination is helping her mood She has a long history of depression but states Lexapro and Abilify makes her feel like her normal self. Phq 9 is still positive , she states July is hard on her , she lost a grandson 7 weeks after his birth back in 2018 - he was premature. 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 Trazodone  is helping her stay and fall asleep but feels tired during  the day. Taking  Armodafinil since the Summer 22  and has noticed more alertness during working hours. Unchanged   GERD/esophagitis/post-cholecystectomy diarrhea/elevated liver enzymes  : under the care of  [employer] PA at [facility], and is now on Nexium and Colestipol, she states bowel movements are down to once a day, no longer having dumping syndrome. She has a visit with him in 2 weeks. We will recheck labs and she states she will make sure he is aware of results   Left leg pain: she states had osteomyelitis of left femur in her early 78 's and had to have surgery, she states over the past 2 weeks she has noticed pain on left outer upper thigh , feels tight when she first wakes up, she has been using hot patches during the night. She states pain is described as aching. She also had a recent fall - June 2023 - landed on left knee about one month ago . She feels a tightness on left outer thigh   Patient Active Problem List   Diagnosis Date Noted   Abnormal liver function tests 04/08/2020   Cholelithiasis 04/08/2020   Left upper quadrant pain 04/08/2020   Esophagitis 04/08/2020   Gastric ulcer 04/08/2020   Other esophagitis without bleeding 04/08/2020   Morbid (severe) obesity due  to excess calories (HCC) 03/28/2017   Hyperglycemia 12/22/2015   Tendonitis, Achilles, right 01/11/2015   Anxiety disorder 12/07/2014   Complete rotator cuff tear 11/18/2014   Hypercholesterolemia 07/30/2014   Osteoarthritis of both knees 07/30/2014   Chronic constipation 08/29/2011   HTN (hypertension) 05/30/2011   Insomnia 05/30/2011   Obesity (BMI 30-39.9) 05/30/2011   GERD (gastroesophageal reflux disease) 05/30/2011    Past Surgical History:  Procedure Laterality Date   CHOLECYSTECTOMY  07/26/11   INTRAOPERATIVE CHOLANGIOGRAM  07/26/2011   Procedure: INTRAOPERATIVE CHOLANGIOGRAM;  Surgeon: Ardeth Sportsman, MD;  Location: [facility];  Service: General;  Laterality: N/A;   LIVER BIOPSY  07/26/2011   Procedure: LIVER  BIOPSY;  Surgeon: Ardeth Sportsman, MD;  Location: [facility];  Service: General;  Laterality: N/A;  core liver biopsy    osteomilitis     right and left leg/surgery; right 1983; left 1988   osteomylitis  [id number]   right leg, left leg   SHOULDER OPEN ROTATOR CUFF REPAIR Right 11/18/2014   Procedure: RIGHT SHOULDER MINI OPEN ROTATOR CUFF REPAIR SUBACROMIAL DECOMPRESSION  ;  Surgeon: Jene Every, MD;  Location: [facility];  Service: Orthopedics;  Laterality: Right;    Family History  Problem Relation Age of Onset   COPD Mother    Hypertension Mother    Hyperlipidemia Mother    Glaucoma Mother    Alcohol abuse Father    Colon cancer Father    Liver cancer Father    Cancer Maternal Aunt        breast   Breast cancer Paternal Aunt    Kidney failure Brother        He was waiting to get a kidney    Social History   Tobacco Use   Smoking status: Never   Smokeless tobacco: Never  Substance Use Topics   Alcohol use: Yes    Alcohol/week: 0.0 standard drinks of alcohol    Comment: occassionally     Current Outpatient Medications:    amLODipine (NORVASC) 10 MG tablet, Take 1 tablet (10 mg total) by mouth daily., Disp: 90 tablet, Rfl: 1   ARIPiprazole (ABILIFY) 2 MG tablet, Take 1 tablet (2 mg total) by mouth every evening., Disp: 90 tablet, Rfl: 1   b complex vitamins tablet, Take 1 tablet by mouth daily., Disp: , Rfl:    BIOTIN PO, Take 10,000 mg by mouth daily., Disp: , Rfl:    colestipol (COLESTID) 1 g tablet, Take 2 g by mouth 2 (two) times daily., Disp: , Rfl:    diclofenac Sodium (VOLTAREN) 1 % GEL, Apply topically., Disp: , Rfl:    escitalopram (LEXAPRO) 10 MG tablet, Take 1 tablet (10 mg total) by mouth daily., Disp: 90 tablet, Rfl: 1   FIBER PO, Take 5 tablets by mouth 2 (two) times daily., Disp: , Rfl:    hydrOXYzine (ATARAX) 10 MG tablet, TAKE 1 TABLET DAILY AS NEEDED, Disp: 90 tablet, Rfl: 3   ketotifen (ZADITOR) 0.025 % ophthalmic solution, 1 drop 2 (two) times daily., Disp:  , Rfl:    levonorgestrel (MIRENA) 20 MCG/24HR IUD, 1 each by Intrauterine route once. , Disp: , Rfl:    Magnesium 500 MG TABS, , Disp: , Rfl:    modafinil (PROVIGIL) 100 MG tablet, Take 1 tablet (100 mg total) by mouth daily., Disp: 90 tablet, Rfl: 1   Multiple Vitamin (MULTI-VITAMIN DAILY PO), Take 1 tablet by mouth daily with breakfast. Women's, Disp: , Rfl:    Omega-3 Fatty Acids (FISH  OIL) 1000 MG CAPS, Take 1,000 mg by mouth 2 (two) times daily., Disp: , Rfl:    omeprazole (PRILOSEC) 40 MG capsule, Take by mouth., Disp: , Rfl:    traMADol (ULTRAM) 50 MG tablet, Take 1 tablet (50 mg total) by mouth daily as needed. To last 90 days, Disp: 90 tablet, Rfl: 0   traZODone (DESYREL) 50 MG tablet, Take 1 tablet (50 mg total) by mouth at bedtime., Disp: 90 tablet, Rfl: 1   valsartan-hydrochlorothiazide (DIOVAN-HCT) 320-25 MG tablet, Take 1 tablet by mouth daily., Disp: 90 tablet, Rfl: 1  Allergies  Allergen Reactions   Percocet [Oxycodone-Acetaminophen] Anaphylaxis    bronchospasms   Povidone Iodine Rash    blisters blisters Other reaction(s): Unknown   Adhesive [Tape] Itching   Allergy Relief  [Chlorpheniramine Maleate]     Heart flutters   Oxycodone-Acetaminophen     Other reaction(s): Unknown   Chlorpheniramine     Other reaction(s): irregular heart rate   Diphenhydramine Rash   Penicillins Hives and Rash    Has patient had a PCN reaction causing immediate rash, facial/tongue/throat swelling, SOB or lightheadedness with hypotension: no Has patient had a PCN reaction causing severe rash involving mucus membranes or skin necrosis: no Has patient had a PCN reaction that required hospitalization happened in the hospital Has patient had a PCN reaction occurring within the last 10 years: no If all of the above answers are "NO", then may proceed with Cephalosporin use.     I personally reviewed active problem list, medication list, allergies, family history, social history, health  maintenance with the patient/caregiver today.   ROS  Constitutional: Negative for fever , positive for  weight change.  Respiratory: Negative for cough and shortness of breath.   Cardiovascular: Negative for chest pain or palpitations.  Gastrointestinal: Negative for abdominal pain, no bowel changes.  Musculoskeletal: positive  for gait problem - going down stairs but no joint swelling.  Skin: Negative for rash.  Neurological: Negative for dizziness or headache.  No other specific complaints in a complete review of systems (except as listed in HPI above).   Objective  Vitals:   08/03/21 0932  BP: 114/72  Pulse: 92  Resp: 16  SpO2: 96%  Weight: 210 lb (95.3 kg)  Height: 5\' 1"  (1.549 m)    Body mass index is 39.68 kg/m.  Physical Exam  Constitutional: Patient appears well-developed and well-nourished. Obese  No distress.  HEENT: head atraumatic, normocephalic, pupils equal and reactive to light, neck supple Cardiovascular: Normal rate, regular rhythm and normal heart sounds.  No murmur heard. No BLE edema. Pulmonary/Chest: Effort normal and breath sounds normal. No respiratory distress. Abdominal: Soft.  There is no tenderness. Muscular Skeletal: pain during palpation of left trochanteric bursa Psychiatric: Patient has a normal mood and affect. behavior is normal. Judgment and thought content normal.    PHQ2/9:    08/03/2021    9:30 AM 03/03/2021    2:32 PM 11/18/2020    1:08 PM 10/27/2020   11:16 AM 07/29/2020   11:22 AM  Depression screen PHQ 2/9  Decreased Interest 1 0 1 0 0  Down, Depressed, Hopeless 1 0 2 1 1   PHQ - 2 Score 2 0 3 1 1   Altered sleeping 0 0  0 0  Tired, decreased energy 0 0  0 0  Change in appetite 0 0  1 0  Feeling bad or failure about yourself  1 0  0 0  Trouble concentrating 0 0  0  0  Moving slowly or fidgety/restless 0 0  0 0  Suicidal thoughts 0 0  0 0  PHQ-9 Score 3 0  2 1  Difficult doing work/chores  Not difficult at all       phq 9  is positive   Fall Risk:    08/03/2021    9:30 AM 03/03/2021    2:31 PM 11/18/2020    1:06 PM 10/27/2020   11:16 AM 07/29/2020   11:22 AM  Fall Risk   Falls in the past year? 1  0 0 0  Number falls in past yr: 0 0 0 0 0  Injury with Fall? 1 0 0 0 0  Risk for fall due to : No Fall Risks No Fall Risks No Fall Risks No Fall Risks   Follow up Falls prevention discussed Falls prevention discussed Falls prevention discussed Falls prevention discussed       Functional Status Survey: Is the patient deaf or have difficulty hearing?: No Does the patient have difficulty seeing, even when wearing glasses/contacts?: No Does the patient have difficulty concentrating, remembering, or making decisions?: No Does the patient have difficulty walking or climbing stairs?: Yes Does the patient have difficulty dressing or bathing?: No Does the patient have difficulty doing errands alone such as visiting a doctor's office or shopping?: No    Assessment & Plan  1. Essential hypertension  - amLODipine (NORVASC) 10 MG tablet; Take 1 tablet (10 mg total) by mouth daily.  Dispense: 90 tablet; Refill: 1 - COMPLETE METABOLIC PANEL WITH GFR  2. Mild major depression (HCC)  - ARIPiprazole (ABILIFY) 2 MG tablet; Take 1 tablet (2 mg total) by mouth every evening.  Dispense: 90 tablet; Refill: 1 - escitalopram (LEXAPRO) 10 MG tablet; Take 1 tablet (10 mg total) by mouth daily.  Dispense: 90 tablet; Refill: 1  3. Morbid obesity (HCC)  Discussed with the patient the risk posed by an increased BMI. Discussed importance of portion control, calorie counting and at least 150 minutes of physical activity weekly. Avoid sweet beverages and drink more water. Eat at least 6 servings of fruit and vegetables daily    4. Generalized anxiety disorder  - escitalopram (LEXAPRO) 10 MG tablet; Take 1 tablet (10 mg total) by mouth daily.  Dispense: 90 tablet; Refill: 1  5. History of osteomyelitis  - DG FEMUR MIN 2 VIEWS  LEFT; Future - Sedimentation rate - C-reactive protein  6. Shift work sleep disorder   7. Primary osteoarthritis of both knees  - traMADol (ULTRAM) 50 MG tablet; Take 1 tablet (50 mg total) by mouth daily as needed. To last 90 days  Dispense: 90 tablet; Refill: 0   8. Trochanteric bursitis   Discussed foam rolling, she has some meloxicam at home and will take for one week only with food due to a history of gastric ulcer  Discussed steroid injections, PT , she chose the injection and will do foam rolling at home   Consent form signed Localized bursa  Area prepped with alcohol  Injection with lidocaine 1% and Kenalog 40mg /1 ml on bursa sack Patient tolerated procedure well No side effects    9. Anemia, unspecified type  - CBC with Differential/Platelet - Fe+TIBC+Fer  10. Dyslipidemia   11. Hyperglycemia   13. Routine screening for STI (sexually transmitted infection)  - HIV Antibody (routine testing w rflx) - RPR  14. Elevated liver enzymes  - Hepatitis, Acute

## 2021-08-03 ENCOUNTER — Ambulatory Visit (INDEPENDENT_AMBULATORY_CARE_PROVIDER_SITE_OTHER): Payer: 59 | Admitting: Family Medicine

## 2021-08-03 ENCOUNTER — Encounter: Payer: Self-pay | Admitting: Family Medicine

## 2021-08-03 VITALS — BP 114/72 | HR 92 | Resp 16 | Ht 61.0 in | Wt 210.0 lb

## 2021-08-03 DIAGNOSIS — I10 Essential (primary) hypertension: Secondary | ICD-10-CM

## 2021-08-03 DIAGNOSIS — D649 Anemia, unspecified: Secondary | ICD-10-CM

## 2021-08-03 DIAGNOSIS — M170 Bilateral primary osteoarthritis of knee: Secondary | ICD-10-CM

## 2021-08-03 DIAGNOSIS — R748 Abnormal levels of other serum enzymes: Secondary | ICD-10-CM

## 2021-08-03 DIAGNOSIS — F320 Major depressive disorder, single episode, mild: Secondary | ICD-10-CM | POA: Diagnosis not present

## 2021-08-03 DIAGNOSIS — F411 Generalized anxiety disorder: Secondary | ICD-10-CM | POA: Diagnosis not present

## 2021-08-03 DIAGNOSIS — Z113 Encounter for screening for infections with a predominantly sexual mode of transmission: Secondary | ICD-10-CM

## 2021-08-03 DIAGNOSIS — G4726 Circadian rhythm sleep disorder, shift work type: Secondary | ICD-10-CM

## 2021-08-03 DIAGNOSIS — R739 Hyperglycemia, unspecified: Secondary | ICD-10-CM

## 2021-08-03 DIAGNOSIS — M7062 Trochanteric bursitis, left hip: Secondary | ICD-10-CM | POA: Diagnosis not present

## 2021-08-03 DIAGNOSIS — Z8739 Personal history of other diseases of the musculoskeletal system and connective tissue: Secondary | ICD-10-CM

## 2021-08-03 DIAGNOSIS — E785 Hyperlipidemia, unspecified: Secondary | ICD-10-CM

## 2021-08-03 MED ORDER — TRAMADOL HCL 50 MG PO TABS: 50.0000 mg | ORAL_TABLET | Freq: Every day | ORAL | 0 refills | Status: DC | PRN

## 2021-08-03 MED ORDER — TRIAMCINOLONE ACETONIDE 40 MG/ML IJ SUSP
40.0000 mg | Freq: Once | INTRAMUSCULAR | Status: AC
  Administered 2021-08-03: 40 mg via INTRAMUSCULAR

## 2021-08-03 MED ORDER — ESCITALOPRAM OXALATE 10 MG PO TABS: 10.0000 mg | ORAL_TABLET | Freq: Every day | ORAL | 1 refills | Status: DC

## 2021-08-03 MED ORDER — LIDOCAINE HCL (PF) 1 % IJ SOLN
2.0000 mL | Freq: Once | INTRAMUSCULAR | Status: AC
  Administered 2021-08-03: 2 mL via INTRADERMAL

## 2021-08-03 MED ORDER — AMLODIPINE BESYLATE 10 MG PO TABS: 10.0000 mg | ORAL_TABLET | Freq: Every day | ORAL | 1 refills | Status: DC

## 2021-08-03 MED ORDER — ARIPIPRAZOLE 2 MG PO TABS: 2.0000 mg | ORAL_TABLET | Freq: Every evening | ORAL | 1 refills | Status: DC

## 2021-08-03 NOTE — Patient Instructions (Signed)
Hip Bursitis  Hip bursitis is the swelling of one or more of the fluid-filled sacs (bursae) in the hip joint. The hip bursae absorb shocks and prevent bones from rubbing against each other. If a bursa becomes irritated, it can fill with extra fluid and become inflamed. Hip bursitis can cause mild to moderate pain, and symptoms often come and go over time. What are the causes? This condition results from increased friction between the hip bones and the tendons around the hip joint. This condition can happen if you: Overuse your hip muscles. Injure your hip. Have weak buttocks muscles. Have bone spurs. Have an infection. In some cases, the cause may not be known. What increases the risk? You are more likely to develop this condition if: You injured your hip previously or had hip surgery. You have a medical condition, such as arthritis, gout, diabetes, or thyroid disease. You have spine problems. You have one leg that is shorter than the other. You participate in athletic activities that include repetitive motion, like running. You participate in sports where there is a risk of injury or falling, such as football, martial arts, or skiing. What are the signs or symptoms? Symptoms may come and go, and they often include: Pain in the hip or groin area. Pain may get worse with movement. Tenderness and swelling of the hip. In rare cases, the bursa may become infected. If this happens, you may get a fever, as well as warmth and redness in the hip area. How is this diagnosed? This condition may be diagnosed based on: Your symptoms. Your medical history. A physical exam. Imaging tests, such as: X-rays to check your bones. MRI or ultrasound to check your tendons and muscles. Bone scan. How is this treated? This condition is treated by resting, icing, applying pressure (compression), and raising (elevating) the injured area. This is called RICE treatment. In some cases, RICE treatment may not  be enough to make your symptoms go away. Treatment may also include: Using crutches, a cane, or a walker to decrease the strain on your hip. Taking medicine to help with swelling and pain. Getting a shot of cortisone medicine near the affected area to reduce swelling and pain. Taking antibiotic medicines if there is an infection. Draining fluid out of the bursa to help relieve swelling and pain. Having surgery to remove a damaged or infected bursa. This is rare. Long-term treatment may include: Physical therapy exercises for strength and flexibility. Identifying the cause of your bursitis to prevent future episodes. Lifestyle changes, such as weight loss, to reduce the strain on the hip. Follow these instructions at home: Managing pain, stiffness, and swelling     If directed, put ice on the affected area. To do this: Put ice in a plastic bag. Place a towel between your skin and the bag. Leave the ice on for 20 minutes, 2-3 times a day. Remove the ice if your skin turns bright red. This is very important. If you cannot feel pain, heat, or cold, you have a greater risk of damage to the area. Elevate your hip as much as you can without feeling pain. To do this, put a pillow under your hips while you lie down. If directed, apply heat to the affected area as often as told by your health care provider. Use the heat source that your health care provider recommends, such as a moist heat pack or a heating pad. Place a towel between your skin and the heat source. Leave the heat   on for 20-30 minutes. Remove the heat if your skin turns bright red. This is especially important if you are unable to feel pain, heat, or cold. You may have a greater risk of getting burned. Activity Do not use your hip to support your body weight until your health care provider says that you can. Use crutches, a cane, or a walker as told by your health care provider. If the affected leg is one that you use to drive, ask  your health care provider if it is safe to drive. Rest and protect your hip as much as possible until your pain and swelling get better. Return to your normal activities as told by your health care provider. Ask your health care provider what activities are safe for you. Do exercises as told by your health care provider. General instructions Take over-the-counter and prescription medicines only as told by your health care provider. Gently massage and stretch your injured area as often as is comfortable. Wear compression wraps only as told by your health care provider. If one of your legs is shorter than the other, get fitted for a shoe insert or orthotic. Your health care provider or physical therapist can tell you where to find these items and what size you need. Maintain a healthy weight. Follow instructions from your health care provider for weight control. These may include dietary restrictions. Keep all follow-up visits. This is important. How is this prevented? Exercise regularly or as told by your health care provider. Wear supportive footwear that is appropriate for your sport and daily activities. Warm up and stretch before being active. Cool down and stretch after being active. Take breaks regularly from repetitive activity. If an activity irritates your hip or causes pain, avoid the activity as much as possible. Avoid sitting down for long periods at a time. Where to find more information [employer]: orthoinfo.aaos.org Contact a health care provider if: You have a fever. You develop new symptoms. You have trouble walking or doing everyday activities. You have pain that gets worse or does not get better with medicine. You develop red skin or a feeling of warmth in your hip area. Get help right away if: You cannot move your hip. You have severe pain. You cannot control the muscles in your feet. Summary Hip bursitis is the swelling of one or more  of the fluid-filled sacs (bursae) in the hip joint. Hip bursitis can cause hip or groin pain, and symptoms often come and go over time. This condition is often treated by resting, icing, applying pressure (compression), and raising (elevating) the injured area. Other treatments may be needed. This information is not intended to replace advice given to you by your health care provider. Make sure you discuss any questions you have with your health care provider. Document Revised: 12/27/2020 Document Reviewed: 12/27/2020 Elsevier Patient Education  2023 [employer]  

## 2021-08-05 LAB — CBC WITH DIFFERENTIAL/PLATELET
Absolute Monocytes: 505 cells/uL (ref 200–950)
Basophils Absolute: 52 cells/uL (ref 0–200)
Basophils Relative: 0.9 %
Eosinophils Absolute: 493 cells/uL (ref 15–500)
Eosinophils Relative: 8.5 %
HCT: 36.8 % (ref 35.0–45.0)
Hemoglobin: 12.2 g/dL (ref 11.7–15.5)
Lymphs Abs: 2401 cells/uL (ref 850–3900)
MCH: 29.4 pg (ref 27.0–33.0)
MCHC: 33.2 g/dL (ref 32.0–36.0)
MCV: 88.7 fL (ref 80.0–100.0)
MPV: 11 fL (ref 7.5–12.5)
Monocytes Relative: 8.7 %
Neutro Abs: 2349 cells/uL (ref 1500–7800)
Neutrophils Relative %: 40.5 %
Platelets: 304 10*3/uL (ref 140–400)
RBC: 4.15 10*6/uL (ref 3.80–5.10)
RDW: 12.3 % (ref 11.0–15.0)
Total Lymphocyte: 41.4 %
WBC: 5.8 10*3/uL (ref 3.8–10.8)

## 2021-08-05 LAB — COMPLETE METABOLIC PANEL WITH GFR
AG Ratio: 1.6 (calc) (ref 1.0–2.5)
ALT: 16 U/L (ref 6–29)
AST: 16 U/L (ref 10–35)
Albumin: 4.4 g/dL (ref 3.6–5.1)
Alkaline phosphatase (APISO): 65 U/L (ref 37–153)
BUN: 13 mg/dL (ref 7–25)
CO2: 33 mmol/L — ABNORMAL HIGH (ref 20–32)
Calcium: 9.7 mg/dL (ref 8.6–10.4)
Chloride: 99 mmol/L (ref 98–110)
Creat: 0.85 mg/dL (ref 0.50–1.03)
Globulin: 2.7 g/dL (calc) (ref 1.9–3.7)
Glucose, Bld: 109 mg/dL — ABNORMAL HIGH (ref 65–99)
Potassium: 3.3 mmol/L — ABNORMAL LOW (ref 3.5–5.3)
Sodium: 141 mmol/L (ref 135–146)
Total Bilirubin: 0.5 mg/dL (ref 0.2–1.2)
Total Protein: 7.1 g/dL (ref 6.1–8.1)
eGFR: 80 mL/min/{1.73_m2} (ref >=60)

## 2021-08-05 LAB — RPR: RPR Ser Ql: NONREACTIVE

## 2021-08-05 LAB — IRON,TIBC AND FERRITIN PANEL
%SAT: 12 % (calc) — ABNORMAL LOW (ref 16–45)
Ferritin: 19 ng/mL (ref 16–232)
Iron: 48 ug/dL (ref 45–160)
TIBC: 407 mcg/dL (calc) (ref 250–450)

## 2021-08-05 LAB — HEPATITIS PANEL, ACUTE
Hep A IgM: NONREACTIVE
Hep B C IgM: NONREACTIVE
Hepatitis B Surface Ag: NONREACTIVE
Hepatitis C Ab: NONREACTIVE

## 2021-08-05 LAB — HIV ANTIBODY (ROUTINE TESTING W REFLEX): HIV 1&2 Ab, 4th Generation: NONREACTIVE

## 2021-08-05 LAB — HEMOGLOBIN A1C
Hgb A1c MFr Bld: 5.2 % of total Hgb (ref <5.7)
Mean Plasma Glucose: 103 mg/dL
eAG (mmol/L): 5.7 mmol/L

## 2021-08-18 ENCOUNTER — Encounter: Payer: Self-pay | Admitting: Family Medicine

## 2021-09-11 ENCOUNTER — Other Ambulatory Visit: Payer: Self-pay | Admitting: Family Medicine

## 2021-09-11 DIAGNOSIS — I10 Essential (primary) hypertension: Secondary | ICD-10-CM

## 2021-10-02 ENCOUNTER — Other Ambulatory Visit: Payer: Self-pay | Admitting: Family Medicine

## 2021-10-02 DIAGNOSIS — G4726 Circadian rhythm sleep disorder, shift work type: Secondary | ICD-10-CM

## 2021-10-13 ENCOUNTER — Other Ambulatory Visit: Payer: Self-pay | Admitting: Family Medicine

## 2021-10-13 DIAGNOSIS — G4726 Circadian rhythm sleep disorder, shift work type: Secondary | ICD-10-CM

## 2021-11-01 NOTE — Progress Notes (Signed)
Name: Taylor Hurst   MRN: [id number]    DOB: Apr 04, 1965   Date:11/03/2021       Progress Note  Subjective  Chief Complaint  Follow Up  HPI  HTN: She denies dizziness, chest pain, headaches, SOB  or palpitation. Compliant with medications. BP is towards low end of normal but no orthostatic changes We    Morbid Obesity: she states she developed obesity after second child, went from 167 lbs to 225 lbs during pregnancy. Since Fall of 2019 she changed her diet, and we added Ozempic, it was  curbing her appetite her weight was improving , no longer taking medication because of cost, Saxenda not covered She has BMI above 35 with co-morbidities such as OA , HTN and dyslipidemia  Her weight is only a couple of pounds higher than last time She is trying to eat healthy, following a low carb diet    OA: she is taking Tylenol during the day and tramadol qhs, she has been taking for many years. She continues to have daily aching pain, seen by Ortho had steroid injections was March 2023 at [facility]. She states she is in a lot of pain again, but cannot afford going back at this time. She is taking Tramadol in am's and tyelenol    GAD/MDD: she is off alprazolam , under control with  Lexapro, also taking Hydroxyzine prn and we added Abilify Summer 2022 the combination is helping her mood She has a long history of depression but states Lexapro and Abilify makes her feel like her normal self. Phq 9 is still positive , she states July is hard on her , she lost a grandson 7 weeks after his birth back in 2018 - he was premature. 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 Trazodone  is helping her stay and fall asleep but feels tired during  the day. Taking Modafinil since the Summer 22  and has noticed more alertness during working  hours.  GERD/esophagitis/post-cholecystectomy diarrhea/elevated liver enzymes  : under the care of  [employer] PA at [facility], and is now on Nexium and Colestipol, she states bowel movements are down to once a day, no longer having dumping syndrome. Stable   Trochanteric bursitis:  she is still having pain on left lateral thigh, we gave her home exercise for trochanteric bursitis on her last visit but states still bothersome. Feels like a dull , tight sensation. No rashes, no back pain She states symptoms improved with injection but worse again over the past few days.    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   Mild major depression ([place]) 08/03/2021   Elevated liver function tests 04/08/2020   Cholelithiasis 04/08/2020   Left upper quadrant pain 04/08/2020   Esophagitis 04/08/2020   Gastric ulcer 04/08/2020   Other esophagitis without bleeding 04/08/2020   Morbid obesity ([place]) 03/28/2017   Hyperglycemia 12/22/2015   Tendonitis, Achilles, right 01/11/2015   Anxiety disorder 12/07/2014   Complete rotator cuff tear 11/18/2014   Hypercholesterolemia 07/30/2014   Osteoarthritis of both knees 07/30/2014   Chronic constipation 08/29/2011   Essential hypertension 05/30/2011   Insomnia 05/30/2011   Obesity (BMI 30-39.9) 05/30/2011   GERD (gastroesophageal reflux disease)  05/30/2011    Past Surgical History:  Procedure Laterality Date   CHOLECYSTECTOMY  07/26/2011   INTRAOPERATIVE CHOLANGIOGRAM  07/26/2011   Procedure: INTRAOPERATIVE CHOLANGIOGRAM;  Surgeon: Adin Hector, MD;  Location: [facility];  Service: General;  Laterality: N/A;   LIVER BIOPSY  07/26/2011   Procedure: LIVER BIOPSY;  Surgeon: Adin Hector, MD;  Location: [facility];  Service: General;  Laterality: N/A;  core liver biopsy    osteomilitis  1983   right femur   osteomylitis  1988   left femur   SHOULDER OPEN ROTATOR CUFF  REPAIR Right 11/18/2014   Procedure: RIGHT SHOULDER MINI OPEN ROTATOR CUFF REPAIR SUBACROMIAL DECOMPRESSION  ;  Surgeon: Susa Day, MD;  Location: [facility];  Service: Orthopedics;  Laterality: Right;    Family History  Problem Relation Age of Onset   COPD Mother    Hypertension Mother    Hyperlipidemia Mother    Glaucoma Mother    Alcohol abuse Father    Colon cancer Father    Liver cancer Father    Cancer Maternal Aunt        breast   Breast cancer Paternal Aunt    Kidney failure Brother        He was waiting to get a kidney    Social History   Tobacco Use   Smoking status: Never   Smokeless tobacco: Never  Substance Use Topics   Alcohol use: Yes    Alcohol/week: 0.0 standard drinks of alcohol    Comment: occassionally     Current Outpatient Medications:    amLODipine (NORVASC) 10 MG tablet, Take 1 tablet (10 mg total) by mouth daily., Disp: 90 tablet, Rfl: 1   ARIPiprazole (ABILIFY) 2 MG tablet, Take 1 tablet (2 mg total) by mouth every evening., Disp: 90 tablet, Rfl: 1   b complex vitamins tablet, Take 1 tablet by mouth daily., Disp: , Rfl:    BIOTIN PO, Take 10,000 mg by mouth daily., Disp: , Rfl:    colestipol (COLESTID) 1 g tablet, Take 1 g by mouth 2 (two) times daily., Disp: , Rfl:    diclofenac Sodium (VOLTAREN) 1 % GEL, Apply topically., Disp: , Rfl:    escitalopram (LEXAPRO) 10 MG tablet, Take 1 tablet (10 mg total) by mouth daily., Disp: 90 tablet, Rfl: 1   FIBER PO, Take 5 tablets by mouth 2 (two) times daily., Disp: , Rfl:    hydrOXYzine (ATARAX) 10 MG tablet, TAKE 1 TABLET DAILY AS NEEDED, Disp: 90 tablet, Rfl: 3   ketotifen (ZADITOR) 0.025 % ophthalmic solution, 1 drop 2 (two) times daily., Disp: , Rfl:    levonorgestrel (MIRENA) 20 MCG/24HR IUD, 1 each by Intrauterine route once. , Disp: , Rfl:    Magnesium 500 MG TABS, , Disp: , Rfl:    modafinil (PROVIGIL) 100 MG tablet, Take 1 tablet (100 mg total) by mouth daily., Disp: 90 tablet, Rfl: 0   Multiple  Vitamin (MULTI-VITAMIN DAILY PO), Take 1 tablet by mouth daily with breakfast. Women's, Disp: , Rfl:    Omega-3 Fatty Acids (FISH OIL) 1000 MG CAPS, Take 1,000 mg by mouth 2 (two) times daily., Disp: , Rfl:    omeprazole (PRILOSEC) 40 MG capsule, Take by mouth., Disp: , Rfl:    traMADol (ULTRAM) 50 MG tablet, Take 1 tablet (50 mg total) by mouth daily as needed. To last 90 days, Disp: 90 tablet, Rfl: 0   traZODone (DESYREL) 50 MG tablet, TAKE 1 TABLET AT BEDTIME, Disp: 90  tablet, Rfl: 0   valsartan-hydrochlorothiazide (DIOVAN-HCT) 320-25 MG tablet, TAKE 1 TABLET DAILY, Disp: 90 tablet, Rfl: 0  Allergies  Allergen Reactions   Percocet [Oxycodone-Acetaminophen] Anaphylaxis    bronchospasms   Povidone Iodine Rash    blisters blisters Other reaction(s): Unknown   Adhesive [Tape] Itching   Allergy Relief  [Chlorpheniramine Maleate]     Heart flutters   Oxycodone-Acetaminophen     Other reaction(s): Unknown   Chlorpheniramine     Other reaction(s): irregular heart rate   Diphenhydramine Rash   Penicillins Hives and Rash    Has patient had a PCN reaction causing immediate rash, facial/tongue/throat swelling, SOB or lightheadedness with hypotension: no Has patient had a PCN reaction causing severe rash involving mucus membranes or skin necrosis: no Has patient had a PCN reaction that required hospitalization happened in the hospital Has patient had a PCN reaction occurring within the last 10 years: no If all of the above answers are "NO", then may proceed with Cephalosporin use.     I personally reviewed active problem list, medication list, allergies, family history, social history, health maintenance with the patient/caregiver today.   ROS  Constitutional: Negative for fever or weight change.  Respiratory: Negative for cough and shortness of breath.   Cardiovascular: Negative for chest pain or palpitations.  Gastrointestinal: Negative for abdominal pain, no bowel changes.   Musculoskeletal: positive  for gait problem or joint swelling.  Skin: Negative for rash.  Neurological: Negative for dizziness or headache.  No other specific complaints in a complete review of systems (except as listed in HPI above).   Objective  Vitals:   11/03/21 1442  BP: 112/70  Pulse: 90  Resp: 16  Temp: 98.1 F (36.7 C)  TempSrc: Oral  SpO2: 96%  Weight: 212 lb 9.6 oz (96.4 kg)  Height: 5\' 1"  (1.549 m)    Body mass index is 40.17 kg/m.  Physical Exam  Constitutional: Patient appears well-developed and well-nourished. Obese  No distress.  HEENT: head atraumatic, normocephalic, pupils equal and reactive to light, neck supple Cardiovascular: Normal rate, regular rhythm and normal heart sounds.  No murmur heard. No BLE edema. Pulmonary/Chest: Effort normal and breath sounds normal. No respiratory distress. Abdominal: Soft.  There is no tenderness. Muscular Skeletal: tender during palpation of left trochanteric bursa, no pain during palpation of spine, antalgic gait due to chronic knee pain  Psychiatric: Patient has a normal mood and affect. behavior is normal. Judgment and thought content normal.   PHQ2/9:    11/03/2021    2:46 PM 08/03/2021    9:30 AM 03/03/2021    2:32 PM 11/18/2020    1:08 PM 10/27/2020   11:16 AM  Depression screen PHQ 2/9  Decreased Interest 2 1 0 1 0  Down, Depressed, Hopeless 0 1 0 2 1  PHQ - 2 Score 2 2 0 3 1  Altered sleeping 0 0 0  0  Tired, decreased energy 1 0 0  0  Change in appetite 2 0 0  1  Feeling bad or failure about yourself  1 1 0  0  Trouble concentrating 1 0 0  0  Moving slowly or fidgety/restless 0 0 0  0  Suicidal thoughts 0 0 0  0  PHQ-9 Score 7 3 0  2  Difficult doing work/chores Somewhat difficult  Not difficult at all      phq 9 is  positive, this is the month her brother died two years ago, had month for her  Fall Risk:    11/03/2021    2:45 PM 08/03/2021    9:30 AM 03/03/2021    2:31 PM 11/18/2020    1:06  PM 10/27/2020   11:16 AM  Fall Risk   Falls in the past year? 1 1  0 0  Number falls in past yr: 0 0 0 0 0  Injury with Fall? 0 1 0 0 0  Risk for fall due to : History of fall(s) No Fall Risks No Fall Risks No Fall Risks No Fall Risks  Follow up Falls prevention discussed;Education provided;Falls evaluation completed Falls prevention discussed Falls prevention discussed Falls prevention discussed Falls prevention discussed     Assessment & Plan  1. Essential hypertension  Towards low end of nromal   2. Trochanteric bursitis, left hip  Discussed PT but wants to hold off, continue foam rolling   3. Morbid obesity ([place])  Discussed with the patient the risk posed by an increased BMI. Discussed importance of portion control, calorie counting and at least 150 minutes of physical activity weekly. Avoid sweet beverages and drink more water. Eat at least 6 servings of fruit and vegetables daily    4. Primary osteoarthritis of both knees  - traMADol (ULTRAM) 50 MG tablet; Take 1 tablet (50 mg total) by mouth daily as needed. To last 90 days  Dispense: 90 tablet; Refill: 0  5. Shift work sleep disorder  - modafinil (PROVIGIL) 100 MG tablet; Take 1 tablet (100 mg total) by mouth daily.  Dispense: 90 tablet; Refill: 0  6. Gastroesophageal reflux disease without esophagitis

## 2021-11-03 ENCOUNTER — Ambulatory Visit (INDEPENDENT_AMBULATORY_CARE_PROVIDER_SITE_OTHER): Payer: 59 | Admitting: Family Medicine

## 2021-11-03 ENCOUNTER — Encounter: Payer: Self-pay | Admitting: Family Medicine

## 2021-11-03 DIAGNOSIS — M7062 Trochanteric bursitis, left hip: Secondary | ICD-10-CM

## 2021-11-03 DIAGNOSIS — M170 Bilateral primary osteoarthritis of knee: Secondary | ICD-10-CM

## 2021-11-03 DIAGNOSIS — I10 Essential (primary) hypertension: Secondary | ICD-10-CM | POA: Diagnosis not present

## 2021-11-03 DIAGNOSIS — G4726 Circadian rhythm sleep disorder, shift work type: Secondary | ICD-10-CM

## 2021-11-03 DIAGNOSIS — K219 Gastro-esophageal reflux disease without esophagitis: Secondary | ICD-10-CM

## 2021-11-03 MED ORDER — TRAMADOL HCL 50 MG PO TABS: 50.0000 mg | ORAL_TABLET | Freq: Every day | ORAL | 0 refills | Status: DC | PRN

## 2021-11-03 MED ORDER — MODAFINIL 100 MG PO TABS: 100.0000 mg | ORAL_TABLET | Freq: Every day | ORAL | 0 refills | Status: DC

## 2021-11-03 NOTE — Patient Instructions (Signed)
Hip Bursitis Rehab Ask your health care provider which exercises are safe for you. Do exercises exactly as told by your health care provider and adjust them as directed. It is normal to feel mild stretching, pulling, tightness, or discomfort as you do these exercises. Stop right away if you feel sudden pain or your pain gets worse. Do not begin these exercises until told by your health care provider. Stretching exercise This exercise warms up your muscles and joints and improves the movement and flexibility of your hip. This exercise also helps to relieve pain and stiffness. Iliotibial band stretch An iliotibial band is a strong band of muscle tissue that runs from the outer side of your hip to the outer side of your thigh and knee. Lie on your side with your left / right leg in the top position. Bend your left / right knee and grab your ankle. Stretch out your bottom arm to help you balance. Slowly bring your knee back so your thigh is slightly behind your body. Slowly lower your knee toward the floor until you feel a gentle stretch on the outside of your left / right thigh. If you do not feel a stretch and your knee will not lower more toward the floor, place the heel of your other foot on top of your knee and pull your knee down toward the floor with your foot. Hold this position for __________ seconds. Slowly return to the starting position. Repeat __________ times. Complete this exercise __________ times a day. Strengthening exercises These exercises build strength and endurance in your hip and pelvis. Endurance is the ability to use your muscles for a long time, even after they get tired. Bridge This exercise strengthens the muscles that move your thigh backward (hip extensors). Lie on your back on a firm surface with your knees bent and your feet flat on the floor. Tighten your buttocks muscles and lift your buttocks off the floor until your trunk is level with your thighs. Do not arch your  back. You should feel the muscles working in your buttocks and the back of your thighs. If you do not feel these muscles, slide your feet 1-2 inches (2.5-5 cm) farther away from your buttocks. If this exercise is too easy, try doing it with your arms crossed over your chest. Hold this position for __________ seconds. Slowly lower your hips to the starting position. Let your muscles relax completely after each repetition. Repeat __________ times. Complete this exercise __________ times a day. Squats This exercise strengthens the muscles in front of your thigh and knee (quadriceps). Stand in front of a table, with your feet and knees pointing straight ahead. You may rest your hands on the table for balance but not for support. Slowly bend your knees and lower your hips like you are going to sit in a chair. Keep your weight over your heels, not over your toes. Keep your lower legs upright so they are parallel with the table legs. Do not let your hips go lower than your knees. Do not bend lower than told by your health care provider. If your hip pain increases, do not bend as low. Hold the squat position for __________ seconds. Slowly push with your legs to return to standing. Do not use your hands to pull yourself to standing. Repeat __________ times. Complete this exercise __________ times a day. Hip hike  Stand sideways on a bottom step. Stand on your left / right leg with your other foot unsupported next to   the step. You can hold on to the railing or wall for balance if needed. Keep your knees straight and your torso square. Then lift your left / right hip up toward the ceiling. Hold this position for __________ seconds. Slowly let your left / right hip lower toward the floor, past the starting position. Your foot should get closer to the floor. Do not lean or bend your knees. Repeat __________ times. Complete this exercise __________ times a day. Single leg stand This exercise increases  your balance. Without shoes, stand near a railing or in a doorway. You may hold on to the railing or door frame as needed for balance. Squeeze your left / right buttock muscles, then lift up your other foot. Do not let your left / right hip push out to the side. It is helpful to stand in front of a mirror for this exercise so you can watch your hip. Hold this position for __________ seconds. Repeat __________ times. Complete this exercise __________ times a day. This information is not intended to replace advice given to you by your health care provider. Make sure you discuss any questions you have with your health care provider. Document Revised: 12/14/2020 Document Reviewed: 12/14/2020 Elsevier Patient Education  2023 [employer] Inc[employer]  

## 2021-12-08 ENCOUNTER — Other Ambulatory Visit: Payer: Self-pay | Admitting: Family Medicine

## 2021-12-08 DIAGNOSIS — I10 Essential (primary) hypertension: Secondary | ICD-10-CM

## 2021-12-08 NOTE — Telephone Encounter (Signed)
Requested Prescriptions  Pending Prescriptions Disp Refills   valsartan-hydrochlorothiazide (DIOVAN-HCT) 320-25 MG tablet [Pharmacy Med Name: VALSARTAN/HCTZ TABS 320/25MG] 90 tablet 0    Sig: TAKE 1 TABLET DAILY     Cardiovascular: ARB + Diuretic Combos Failed - 12/08/2021 12:21 AM      Failed - K in normal range and within 180 days    Potassium  Date Value Ref Range Status  08/03/2021 3.3 (L) 3.5 - 5.3 mmol/L Final         Passed - Na in normal range and within 180 days    Sodium  Date Value Ref Range Status  08/03/2021 141 135 - 146 mmol/L Final  09/03/2014 144 134 - 144 mmol/L Final         Passed - Cr in normal range and within 180 days    Creat  Date Value Ref Range Status  08/03/2021 0.85 0.50 - 1.03 mg/dL Final         Passed - eGFR is 10 or above and within 180 days    GFR, Est African American  Date Value Ref Range Status  10/29/2019 91 > OR = 60 mL/min/1.28m Final   GFR, Est Non African American  Date Value Ref Range Status  10/29/2019 79 > OR = 60 mL/min/1.746mFinal   eGFR  Date Value Ref Range Status  08/03/2021 80 > OR = 60 mL/min/1.7327minal    Comment:    The eGFR is based on the CKD-EPI 2021 equation. To calculate  the new eGFR from a previous Creatinine or Cystatin C result, go to https://www.kidney.org/professionals/ kdoqi/gfr%5Fcalculator          Passed - Patient is not pregnant      Passed - Last BP in normal range    BP Readings from Last 1 Encounters:  11/03/21 112/70         Passed - Valid encounter within last 6 months    Recent Outpatient Visits           [facility] month [facility]   4 months [facility]   9 months [facility]   1 year ago Well adult [facility]   1 year  [facility]       Future Appointments             [facility] 1 [facility]

## 2021-12-14 ENCOUNTER — Encounter: Payer: 59 | Admitting: Family Medicine

## 2021-12-20 ENCOUNTER — Ambulatory Visit (INDEPENDENT_AMBULATORY_CARE_PROVIDER_SITE_OTHER): Payer: 59 | Admitting: Licensed Practical Nurse

## 2021-12-20 VITALS — BP 131/86 | HR 72 | Ht 61.0 in | Wt 211.4 lb

## 2021-12-20 DIAGNOSIS — Z30433 Encounter for removal and reinsertion of intrauterine contraceptive device: Secondary | ICD-10-CM | POA: Diagnosis not present

## 2021-12-20 DIAGNOSIS — Z01419 Encounter for gynecological examination (general) (routine) without abnormal findings: Secondary | ICD-10-CM

## 2021-12-20 DIAGNOSIS — N912 Amenorrhea, unspecified: Secondary | ICD-10-CM

## 2021-12-20 DIAGNOSIS — Z3040 Encounter for surveillance of contraceptives, unspecified: Secondary | ICD-10-CM

## 2021-12-20 MED ORDER — LEVONORGESTREL 20 MCG/DAY IU IUD
1.0000 | INTRAUTERINE_SYSTEM | Freq: Once | INTRAUTERINE | Status: AC
  Administered 2021-12-20: 1 via INTRAUTERINE

## 2021-12-20 NOTE — Progress Notes (Signed)
Gynecology Annual Exam  PCP: Steele Sizer, MD  Chief Complaint:  Chief Complaint  Patient presents with   Gynecologic Exam    History of Present Illness:Patient is a 56 y.o. G2P2002 presents for annual exam. The patient has no complaints today. She had an IUD placed by Dr Marcelline Mates about 7 years ago. She would like a new IUD as she does not desire a pregnancy. She is not certain if she has gone through menopause.    LMP: No LMP recorded. (Menstrual status: IUD).   Pt tearful when discussing sexual history. Her husband is 78 years old, has some medical issues that make being interested in sex and sustaining interest difficult. This has caused frustration with Lisabeth Devoid. She has tried to discuss this with her partner.  The patient is occasionally  sexually active. She denies dyspareunia.  The patient does perform self breast exams.  There is no notable family history of breast or ovarian cancer in her family.  The patient wears seatbelts: yes.   The patient has regular exercise: no.    The patient reports current symptoms of depression.   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   Elevated liver function tests 04/08/2020   Cholelithiasis 04/08/2020   Left upper quadrant pain 04/08/2020   Esophagitis 04/08/2020   Gastric ulcer 04/08/2020   Other esophagitis without bleeding 04/08/2020   Morbid obesity ([place]) 03/28/2017   Hyperglycemia 12/22/2015   Tendonitis, Achilles, right 01/11/2015   Anxiety disorder 12/07/2014   Complete rotator cuff tear 11/18/2014   Hypercholesterolemia 07/30/2014    Osteoarthritis of both knees 07/30/2014   Chronic constipation 08/29/2011   Essential hypertension 05/30/2011   Insomnia 05/30/2011   Obesity (BMI 30-39.9) 05/30/2011   GERD (gastroesophageal reflux disease) 05/30/2011    Past Surgical History:  Past Surgical History:  Procedure Laterality Date   CHOLECYSTECTOMY  07/26/2011   INTRAOPERATIVE CHOLANGIOGRAM  07/26/2011   Procedure: INTRAOPERATIVE CHOLANGIOGRAM;  Surgeon: Adin Hector, MD;  Location: [facility];  Service: General;  Laterality: N/A;   LIVER BIOPSY  07/26/2011   Procedure: LIVER BIOPSY;  Surgeon: Adin Hector, MD;  Location: [facility];  Service: General;  Laterality: N/A;  core liver biopsy    osteomilitis  1983   right femur   osteomylitis  1988   left femur   SHOULDER OPEN ROTATOR CUFF REPAIR Right 11/18/2014   Procedure: RIGHT SHOULDER MINI OPEN ROTATOR CUFF REPAIR SUBACROMIAL DECOMPRESSION  ;  Surgeon: Susa Day, MD;  Location: [facility];  Service: Orthopedics;  Laterality: Right;    Gynecologic History:  No LMP recorded. (Menstrual status: IUD). Last Pap: Results were: 2021 no abnormalities  Last mammogram: 04/2021 Results were: BI-RAD I  Obstetric History: G2P2002  Family History:  Family History  Problem Relation Age of Onset   COPD Mother    Hypertension Mother    Hyperlipidemia Mother    Glaucoma Mother    Alcohol abuse Father    Colon cancer Father    Liver cancer Father    Cancer Maternal Aunt  breast   Breast cancer Paternal Aunt    Kidney failure Brother        He was waiting to get a kidney    Social History:  Social History   Socioeconomic History   Marital status: Married    Spouse name: Shanon Brow   Number of children: 2   Years of education: Not on file   Highest education level: Not on file  Occupational History   Not on file  Tobacco Use   Smoking status: Never   Smokeless tobacco: Never  Vaping Use   Vaping Use: Never used  Substance and Sexual Activity   Alcohol use:  Yes    Alcohol/week: 0.0 standard drinks of alcohol    Comment: occassionally   Drug use: No   Sexual activity: Yes    Birth control/protection: I.U.D.    Comment: Mirena  Other Topics Concern   Not on file  Social History Narrative   Not on file   Social Determinants of Health   Financial Resource Strain: Low Risk  (11/18/2020)   Overall Financial Resource Strain (CARDIA)    Difficulty of Paying Living Expenses: Not hard at all  Food Insecurity: No Food Insecurity (11/18/2020)   Hunger Vital Sign    Worried About Running Out of Food in the Last Year: Never true    Ran Out of Food in the Last Year: Never true  Transportation Needs: No Transportation Needs (11/18/2020)   PRAPARE - Hydrologist (Medical): No    Lack of Transportation (Non-Medical): No  Physical Activity: Insufficiently Active (11/18/2020)   Exercise Vital Sign    Days of Exercise per Week: 4 days    Minutes of Exercise per Session: 10 min  Stress: No Stress Concern Present (11/18/2020)   [place]    Feeling of Stress : Only a little  Social Connections: Somewhat Isolated (11/08/2017)   Social Connection and Isolation Panel [NHANES]    Frequency of Communication with Friends and Family: Three times a week    Frequency of Social Gatherings with Friends and Family: Three times a week    Attends Religious Services: Never    Active Member of Clubs or Organizations: No    Attends Archivist Meetings: Never    Marital Status: Married  Human resources officer Violence: Not At Risk (11/18/2020)   Humiliation, Afraid, Rape, and Kick questionnaire    Fear of Current or Ex-Partner: No    Emotionally Abused: No    Physically Abused: No    Sexually Abused: No    Allergies:  Allergies  Allergen Reactions   Percocet [Oxycodone-Acetaminophen] Anaphylaxis    bronchospasms   Povidone Iodine Rash    blisters blisters Other  reaction(s): Unknown   Adhesive [Tape] Itching   Allergy Relief  [Chlorpheniramine Maleate]     Heart flutters   Oxycodone-Acetaminophen     Other reaction(s): Unknown   Chlorpheniramine     Other reaction(s): irregular heart rate   Diphenhydramine Rash   Penicillins Hives and Rash    Has patient had a PCN reaction causing immediate rash, facial/tongue/throat swelling, SOB or lightheadedness with hypotension: no Has patient had a PCN reaction causing severe rash involving mucus membranes or skin necrosis: no Has patient had a PCN reaction that required hospitalization happened in the hospital Has patient had a PCN reaction occurring within the last 10 years: no If all of the above answers are "NO", then may proceed  with Cephalosporin use.     Medications: Prior to Admission medications   Medication Sig Start Date End Date Taking? Authorizing Provider  amLODipine (NORVASC) 10 MG tablet Take 1 tablet (10 mg total) by mouth daily. 08/03/21  Yes Sowles, Danna Hefty, MD  ARIPiprazole (ABILIFY) 2 MG tablet Take 1 tablet (2 mg total) by mouth every evening. 08/03/21  Yes Sowles, Danna Hefty, MD  b complex vitamins tablet Take 1 tablet by mouth daily.   Yes [provider]  BIOTIN PO Take 10,000 mg by mouth daily.   Yes [provider]  colestipol (COLESTID) 1 g tablet Take 1 g by mouth 2 (two) times daily.   Yes Jeani Hawking, MD  diclofenac Sodium (VOLTAREN) 1 % GEL Apply topically. 02/14/18  Yes [provider]  escitalopram (LEXAPRO) 10 MG tablet Take 1 tablet (10 mg total) by mouth daily. 08/03/21  Yes Sowles, Danna Hefty, MD  FIBER PO Take 5 tablets by mouth 2 (two) times daily.   Yes [provider]  hydrOXYzine (ATARAX) 10 MG tablet TAKE 1 TABLET DAILY AS NEEDED 01/17/21  Yes Margarita Mail, DO  ketotifen (ZADITOR) 0.025 % ophthalmic solution 1 drop 2 (two) times daily.   Yes [provider]  levonorgestrel (MIRENA) 20 MCG/24HR IUD 1 each by Intrauterine  route once.    Yes [provider]  Magnesium 500 MG TABS  08/29/18  Yes [provider]  modafinil (PROVIGIL) 100 MG tablet Take 1 tablet (100 mg total) by mouth daily. 11/03/21  Yes Sowles, Danna Hefty, MD  Multiple Vitamin (MULTI-VITAMIN DAILY PO) Take 1 tablet by mouth daily with breakfast. Women's   Yes [provider]  Omega-3 Fatty Acids (FISH OIL) 1000 MG CAPS Take 1,000 mg by mouth 2 (two) times daily.   Yes [provider]  omeprazole (PRILOSEC) 40 MG capsule Take by mouth. 06/10/15  Yes [provider]  traMADol (ULTRAM) 50 MG tablet Take 1 tablet (50 mg total) by mouth daily as needed. To last 90 days 11/03/21  Yes Sowles, Danna Hefty, MD  traZODone (DESYREL) 50 MG tablet TAKE 1 TABLET AT BEDTIME 10/02/21  Yes Sowles, Danna Hefty, MD  valsartan-hydrochlorothiazide (DIOVAN-HCT) 320-25 MG tablet TAKE 1 TABLET DAILY 12/08/21  Yes Alba Cory, MD    Physical Exam Vitals: Blood pressure 131/86, pulse 72, height 5\' 1"  (1.549 m), weight 211 lb 6.4 oz (95.9 kg).  General: NAD HEENT: normocephalic, anicteric Thyroid: no enlargement, no palpable nodules Pulmonary: No increased work of breathing, CTAB Cardiovascular: RRR, distal pulses 2+ Breast: Breast symmetrical, no tenderness, no palpable nodules or masses, no skin or nipple retraction present, no nipple discharge.  No axillary or supraclavicular lymphadenopathy. Abdomen: NABS, soft, non-tender, non-distended.  Umbilicus without lesions.  No hepatomegaly, splenomegaly or masses palpable. No evidence of hernia  Genitourinary:  External: Normal external female genitalia.  Normal urethral meatus, normal Bartholin's and Skene's glands.    Vagina: Normal vaginal mucosa, no evidence of prolapse.    Cervix: Grossly normal in appearance, no bleeding  Uterus: Non-enlarged, mobile, normal contour.  No CMT  Adnexa: ovaries non-enlarged, no adnexal masses  Rectal: deferred  Lymphatic: no evidence of inguinal  lymphadenopathy Extremities: no edema, erythema, or tenderness Neurologic: Grossly intact Psychiatric: mood appropriate, affect full  Female chaperone present for pelvic and breast  portions of the physical exam     GYNECOLOGY OFFICE PROCEDURE NOTE  Taylor Hurst is a 56 y.o. 59 here for IUD removal and reinsertion. The patient currently has a Mirena IUD placed on  09/2014, which will be replaced with a Mirena IUD today.  No GYN concerns.  Last pap smear was on 2021 and was normal.  IUD Removal and Reinsertion  Patient identified, informed consent performed, consent signed.   Discussed risks of irregular bleeding, cramping, infection, malpositioning or uterine perforation of the IUD which may require further procedures. Time out was performed. Speculum placed in the vagina. The strings of the IUD were grasped and pulled using ring forceps. The IUD was successfully removed in its entirety. The cervix was cleaned with chlorhexidine solution x 2  and grasped anteriorly with a single tooth tenaculum.  The uterus was sounded  to 7 cm using a uterine sound. Using ring forceps to steady insertion device,   The IUD was then placed per manufacturer's recommendations. Strings trimmed to 3 cm. Tenaculum was removed, good hemostasis noted. Patient tolerated procedure well.   Patient was given post-procedure instructions.  Patient was also asked to check IUD strings periodically and follow up PRN   Roberto Scales, CNM  [facility]     Assessment: 56 y.o. G2P2002 routine annual exam  Plan: Problem List Items Addressed This Visit   None Visit Diagnoses     Encounter for surveillance of contraceptive device    -  Primary   Relevant Medications   levonorgestrel (MIRENA) 20 MCG/DAY IUD 1 each (Completed)   Amenorrhea       Relevant Orders   FSH (Completed)       1) Mammogram - recommend yearly screening mammogram.  Mammogram Is up to date  2) STI screening  wasoffered and  declined  3) ASCCP guidelines and rational discussed.  Patient opts for every 3 years screening interval  4) Osteoporosis  - per USPTF routine screening DEXA at age 62   Consider FDA-approved medical therapies in postmenopausal women and men aged 71 years and older, based on the following: a) A hip or vertebral (clinical or morphometric) fracture b) T-score ? -2.5 at the femoral neck or spine after appropriate evaluation to exclude secondary causes C) Low bone mass (T-score between -1.0 and -2.5 at the femoral neck or spine) and a 10-year probability of a hip fracture ? 3% or a 10-year probability of a major osteoporosis-related fracture ? 20% based on the [place]-adapted WHO algorithm   5) Routine healthcare maintenance including cholesterol, diabetes screening discussed managed by PCP  6) Colonoscopy up to date .  Screening recommended starting at age 21 for average risk individuals, age 75 for individuals deemed at increased risk (including African Americans) and recommended to continue until age 20.  For patient age 57-85 individualized approach is recommended.  Gold standard screening is via colonoscopy, Cologuard screening is an acceptable alternative for patient unwilling or unable to undergo colonoscopy.  "Colorectal cancer screening for average?risk adults: 2018 guideline update from the [place]: A Cancer Journal for Clinicians: Jun 13, 2016   7) [facility] collected   8) IUD removed and reinserted    Roberto Scales, [place] 12/24/2021, 11:22 AM

## 2021-12-21 ENCOUNTER — Encounter: Payer: Self-pay | Admitting: Licensed Practical Nurse

## 2021-12-21 LAB — FOLLICLE STIMULATING HORMONE: FSH: 68.2 m[IU]/mL

## 2022-01-01 ENCOUNTER — Other Ambulatory Visit: Payer: Self-pay | Admitting: Family Medicine

## 2022-01-01 DIAGNOSIS — G4726 Circadian rhythm sleep disorder, shift work type: Secondary | ICD-10-CM

## 2022-01-10 ENCOUNTER — Other Ambulatory Visit: Payer: Self-pay | Admitting: Internal Medicine

## 2022-01-10 DIAGNOSIS — F411 Generalized anxiety disorder: Secondary | ICD-10-CM

## 2022-01-11 NOTE — Telephone Encounter (Signed)
Requested Prescriptions  Pending Prescriptions Disp Refills   hydrOXYzine (ATARAX) 10 MG tablet [Pharmacy Med Name: HYDROXYZINE HCL TABS 10MG ] 90 tablet 1    Sig: TAKE 1 TABLET DAILY AS NEEDED     Ear, Nose, and Throat:  Antihistamines 2 Passed - 01/10/2022 12:24 AM      Passed - Cr in normal range and within 360 days    Creat  Date Value Ref Range Status  08/03/2021 0.85 0.50 - 1.03 mg/dL Final         Passed - Valid encounter within last 12 months    Recent Outpatient Visits           2 months ago Morbid obesity [facility])   [facility], MD   5 months ago Mild major depression [facility])   [facility], MD   10 months ago Mild major depression [facility])   [facility], MD   1 year ago Well adult exam   [facility], DO   1 year ago Dyslipidemia   [facility], MD       Future Appointments             In 3 weeks Alba Cory, MD [facility]

## 2022-02-01 NOTE — Progress Notes (Addendum)
Name: Taylor Hurst   MRN: [id number]    DOB: 25-May-1965   Date:02/02/2022       Progress Note  Subjective  Chief Complaint  Follow Up  HPI  HTN: She denies dizziness, chest pain, headaches, SOB  or palpitation. Compliant with medications. BP is is at [facility] and denies side effects of medication   Morbid Obesity: she states she developed obesity after second child, went from 167 lbs to 225 lbs during pregnancy. Since Fall of 2019 she changed her diet, and we added Ozempic, it was  curbing her appetite her weight was improving , no longer taking medication because of cost, Saxenda not covered She has BMI above 40 now .Her weight is trending up. She bought exercise bands and has been doing some home exercises    OA: she is taking Tylenol during the day and tramadol qhs, she has been taking for many years. She continues to have daily aching pain, seen by Ortho had steroid injections was March 2023 at [facility]. She states Tramadol helps with pain on her knees Reviewed controlled substance data base, reminded her she is taking controlled medications    GAD/MDD: she is on Lexapro , Abilify and Atarax prn  the combination is helping her mood She has a long history of depression but states Lexapro and Abilify makes her feel like her normal self. 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 Trazodone  is helping her stay and fall asleep but was still feeling tired during the day, but doing well on Modafinil   GERD/esophagitis/post-cholecystectomy diarrhea/elevated liver enzymes  : under the care of  [employer] PA at [place], and is now on Nexium and Colestipol prn , she states bowel movements are down to once a day, no recent episodes of dumping . Stable at this time.   Trochanteric bursitis:  she is still having pain on left lateral thigh,  we gave her home exercise for trochanteric bursitis on her last visit but states still bothersome. Feels like a dull , tight sensation. She is going to follow up with ortho.   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   Mild major depression ([place]) 08/03/2021   Elevated liver function tests 04/08/2020   Cholelithiasis 04/08/2020   Left upper quadrant pain 04/08/2020   Esophagitis 04/08/2020   Gastric ulcer 04/08/2020   Other esophagitis without bleeding 04/08/2020   Morbid obesity ([place]) 03/28/2017   Hyperglycemia 12/22/2015   Tendonitis, Achilles, right 01/11/2015   Anxiety disorder 12/07/2014   Complete rotator cuff tear 11/18/2014   Hypercholesterolemia 07/30/2014   Osteoarthritis of both knees 07/30/2014   Chronic constipation 08/29/2011   Essential hypertension 05/30/2011   Insomnia 05/30/2011   Obesity (BMI 30-39.9) 05/30/2011   GERD (gastroesophageal reflux disease) 05/30/2011    Past Surgical History:  Procedure Laterality Date   CHOLECYSTECTOMY  07/26/2011   INTRAOPERATIVE CHOLANGIOGRAM  07/26/2011   Procedure: INTRAOPERATIVE CHOLANGIOGRAM;  Surgeon: Adin Hector, MD;  Location: [facility];  Service: General;  Laterality: N/A;   LIVER BIOPSY  07/26/2011   Procedure: LIVER BIOPSY;  Surgeon: Adin Hector, MD;  Location: [facility];  Service: General;  Laterality: N/A;  core liver biopsy  osteomilitis  1983   right femur   osteomylitis  1988   left femur   SHOULDER OPEN ROTATOR CUFF REPAIR Right 11/18/2014   Procedure: RIGHT SHOULDER MINI OPEN ROTATOR CUFF REPAIR SUBACROMIAL DECOMPRESSION  ;  Surgeon: Susa Day, MD;  Location: [facility];  Service: Orthopedics;  Laterality: Right;    Family History  Problem Relation Age of Onset   COPD Mother    Hypertension Mother    Hyperlipidemia Mother    Glaucoma Mother    Alcohol abuse Father    Colon cancer Father    Liver cancer Father     Cancer Maternal Aunt        breast   Breast cancer Paternal Aunt    Kidney failure Brother        He was waiting to get a kidney    Social History   Tobacco Use   Smoking status: Never   Smokeless tobacco: Never  Substance Use Topics   Alcohol use: Yes    Alcohol/week: 0.0 standard drinks of alcohol    Comment: occassionally     Current Outpatient Medications:    amLODipine (NORVASC) 10 MG tablet, Take 1 tablet (10 mg total) by mouth daily., Disp: 90 tablet, Rfl: 1   ARIPiprazole (ABILIFY) 2 MG tablet, Take 1 tablet (2 mg total) by mouth every evening., Disp: 90 tablet, Rfl: 1   b complex vitamins tablet, Take 1 tablet by mouth daily., Disp: , Rfl:    BIOTIN PO, Take 10,000 mg by mouth daily., Disp: , Rfl:    colestipol (COLESTID) 1 g tablet, Take 1 g by mouth 2 (two) times daily., Disp: , Rfl:    diclofenac Sodium (VOLTAREN) 1 % GEL, Apply topically., Disp: , Rfl:    escitalopram (LEXAPRO) 10 MG tablet, Take 1 tablet (10 mg total) by mouth daily., Disp: 90 tablet, Rfl: 1   FIBER PO, Take 5 tablets by mouth 2 (two) times daily., Disp: , Rfl:    hydrOXYzine (ATARAX) 10 MG tablet, TAKE 1 TABLET DAILY AS NEEDED, Disp: 90 tablet, Rfl: 1   ketotifen (ZADITOR) 0.025 % ophthalmic solution, 1 drop 2 (two) times daily., Disp: , Rfl:    levonorgestrel (MIRENA) 20 MCG/24HR IUD, 1 each by Intrauterine route once. , Disp: , Rfl:    Magnesium 500 MG TABS, , Disp: , Rfl:    modafinil (PROVIGIL) 100 MG tablet, Take 1 tablet (100 mg total) by mouth daily., Disp: 90 tablet, Rfl: 0   Multiple Vitamin (MULTI-VITAMIN DAILY PO), Take 1 tablet by mouth daily with breakfast. Women's, Disp: , Rfl:    Omega-3 Fatty Acids (FISH OIL) 1000 MG CAPS, Take 1,000 mg by mouth 2 (two) times daily., Disp: , Rfl:    omeprazole (PRILOSEC) 40 MG capsule, Take by mouth., Disp: , Rfl:    traMADol (ULTRAM) 50 MG tablet, Take 1 tablet (50 mg total) by mouth daily as needed. To last 90 days, Disp: 90 tablet, Rfl: 0    traZODone (DESYREL) 50 MG tablet, TAKE 1 TABLET AT BEDTIME, Disp: 90 tablet, Rfl: 0   valsartan-hydrochlorothiazide (DIOVAN-HCT) 320-25 MG tablet, TAKE 1 TABLET DAILY, Disp: 90 tablet, Rfl: 1  Allergies  Allergen Reactions   Percocet [Oxycodone-Acetaminophen] Anaphylaxis    bronchospasms   Povidone Iodine Rash    blisters blisters Other reaction(s): Unknown   Adhesive [Tape] Itching   Allergy Relief  [Chlorpheniramine Maleate]     Heart flutters   Oxycodone-Acetaminophen     Other reaction(s): Unknown   Chlorpheniramine  Other reaction(s): irregular heart rate   Diphenhydramine Rash   Penicillins Hives and Rash    Has patient had a PCN reaction causing immediate rash, facial/tongue/throat swelling, SOB or lightheadedness with hypotension: no Has patient had a PCN reaction causing severe rash involving mucus membranes or skin necrosis: no Has patient had a PCN reaction that required hospitalization happened in the hospital Has patient had a PCN reaction occurring within the last 10 years: no If all of the above answers are "NO", then may proceed with Cephalosporin use.     I personally reviewed active problem list, medication list, allergies, family history, social history, health maintenance with the patient/caregiver today.   ROS  Constitutional: Negative for fever or weight change.  Respiratory: Negative for cough and shortness of breath.   Cardiovascular: Negative for chest pain or palpitations.  Gastrointestinal: Negative for abdominal pain, no bowel changes.  Musculoskeletal: Negative for gait problem or joint swelling.  Skin: Negative for rash.  Neurological: Negative for dizziness or headache.  No other specific complaints in a complete review of systems (except as listed in HPI above).   Objective  Vitals:   02/02/22 1347  BP: 122/72  Pulse: 97  Resp: 16  SpO2: 98%  Weight: 213 lb (96.6 kg)  Height: 5\' 1"  (1.549 m)    Body mass index is 40.25  kg/m.  Physical Exam  Constitutional: Patient appears well-developed and well-nourished. Obese  No distress.  HEENT: head atraumatic, normocephalic, pupils equal and reactive to light, neck supple Cardiovascular: Normal rate, regular rhythm and normal heart sounds.  No murmur heard. No BLE edema. Pulmonary/Chest: Effort normal and breath sounds normal. No respiratory distress. Abdominal: Soft.  There is no tenderness. Psychiatric: Patient has a normal mood and affect. behavior is normal. Judgment and thought content normal.   Recent Results (from the past 2160 hour(s))  FSH     Status: None   Collection Time: 12/20/21  2:46 PM  Result Value Ref Range   FSH 68.2 mIU/mL    Comment:                      Adult Female             Range                       Follicular phase      3.5 -  12.5                       Ovulation phase       4.7 -  21.5                       Luteal phase          1.7 -   7.7                       Postmenopausal       25.8 - 134.8     PHQ2/9:    02/02/2022    1:46 PM 11/03/2021    2:46 PM 08/03/2021    9:30 AM 03/03/2021    2:32 PM 11/18/2020    1:08 PM  Depression screen PHQ 2/9  Decreased Interest 0 2 1 0 1  Down, Depressed, Hopeless 1 0 1 0 2  PHQ - 2 Score 1 2 2  0 3  Altered sleeping 0 0 0 0  Tired, decreased energy 0 1 0 0   Change in appetite 2 2 0 0   Feeling bad or failure about yourself  1 1 1  0   Trouble concentrating 0 1 0 0   Moving slowly or fidgety/restless 0 0 0 0   Suicidal thoughts 0 0 0 0   PHQ-9 Score 4 7 3  0   Difficult doing work/chores  Somewhat difficult  Not difficult at all     phq 9 is positive   Fall Risk:    02/02/2022    1:46 PM 11/03/2021    2:45 PM 08/03/2021    9:30 AM 03/03/2021    2:31 PM 11/18/2020    1:06 PM  Fall Risk   Falls in the past year? 0 1 1  0  Number falls in past yr: 0 0 0 0 0  Injury with Fall? 0 0 1 0 0  Risk for fall due to : No Fall Risks History of fall(s) No Fall Risks No Fall Risks No  Fall Risks  Follow up Falls prevention discussed Falls prevention discussed;Education provided;Falls evaluation completed Falls prevention discussed Falls prevention discussed Falls prevention discussed      Functional Status Survey: Is the patient deaf or have difficulty hearing?: No Does the patient have difficulty seeing, even when wearing glasses/contacts?: No Does the patient have difficulty concentrating, remembering, or making decisions?: No Does the patient have difficulty walking or climbing stairs?: No Does the patient have difficulty dressing or bathing?: No Does the patient have difficulty doing errands alone such as visiting a doctor's office or shopping?: No    Assessment & Plan  1. Essential hypertension  - amLODipine (NORVASC) 10 MG tablet; Take 1 tablet (10 mg total) by mouth daily.  Dispense: 90 tablet; Refill: 1  2. Mild major depression (H[facility])  - ARIPiprazole (ABILIFY) 2 MG tablet; Take 1 tablet (2 mg total) by mouth every evening.  Dispense: 90 tablet; Refill: 1 - escitalopram (LEXAPRO) 10 MG tablet; Take 1 tablet (10 mg total) by mouth daily.  Dispense: 90 tablet; Refill: 1  3. Morbid obesity ([place])  Discussed with the patient the risk posed by an increased BMI. Discussed importance of portion control, calorie counting and at least 150 minutes of physical activity weekly. Avoid sweet beverages and drink more water. Eat at least 6 servings of fruit and vegetables daily    4. Generalized anxiety disorder  - escitalopram (LEXAPRO) 10 MG tablet; Take 1 tablet (10 mg total) by mouth daily.  Dispense: 90 tablet; Refill: 1  5. Shift work sleep disorder  - modafinil (PROVIGIL) 100 MG tablet; Take 1 tablet (100 mg total) by mouth daily.  Dispense: 90 tablet; Refill: 0 - traZODone (DESYREL) 50 MG tablet; Take 1 tablet (50 mg total) by mouth at bedtime.  Dispense: 90 tablet; Refill: 1  6. Primary osteoarthritis of both knees  - traMADol (ULTRAM) 50 MG tablet; Take 1  tablet (50 mg total) by mouth daily as needed. To last 90 days  Dispense: 90 tablet; Refill: 0  7. Dyslipidemia   8. Gastroesophageal reflux disease without esophagitis

## 2022-02-02 ENCOUNTER — Ambulatory Visit (INDEPENDENT_AMBULATORY_CARE_PROVIDER_SITE_OTHER): Payer: 59 | Admitting: Family Medicine

## 2022-02-02 ENCOUNTER — Encounter: Payer: Self-pay | Admitting: Family Medicine

## 2022-02-02 VITALS — BP 122/72 | HR 97 | Resp 16 | Ht 61.0 in | Wt 213.0 lb

## 2022-02-02 DIAGNOSIS — I10 Essential (primary) hypertension: Secondary | ICD-10-CM | POA: Diagnosis not present

## 2022-02-02 DIAGNOSIS — F320 Major depressive disorder, single episode, mild: Secondary | ICD-10-CM | POA: Diagnosis not present

## 2022-02-02 DIAGNOSIS — E785 Hyperlipidemia, unspecified: Secondary | ICD-10-CM

## 2022-02-02 DIAGNOSIS — G4726 Circadian rhythm sleep disorder, shift work type: Secondary | ICD-10-CM

## 2022-02-02 DIAGNOSIS — F411 Generalized anxiety disorder: Secondary | ICD-10-CM

## 2022-02-02 DIAGNOSIS — K219 Gastro-esophageal reflux disease without esophagitis: Secondary | ICD-10-CM

## 2022-02-02 DIAGNOSIS — M170 Bilateral primary osteoarthritis of knee: Secondary | ICD-10-CM

## 2022-02-02 MED ORDER — ARIPIPRAZOLE 2 MG PO TABS: 2.0000 mg | ORAL_TABLET | Freq: Every evening | ORAL | 1 refills | Status: DC

## 2022-02-02 MED ORDER — ESCITALOPRAM OXALATE 10 MG PO TABS: 10.0000 mg | ORAL_TABLET | Freq: Every day | ORAL | 1 refills | Status: DC

## 2022-02-02 MED ORDER — MODAFINIL 100 MG PO TABS: 100.0000 mg | ORAL_TABLET | Freq: Every day | ORAL | 0 refills | Status: DC

## 2022-02-02 MED ORDER — TRAMADOL HCL 50 MG PO TABS: 50.0000 mg | ORAL_TABLET | Freq: Every day | ORAL | 0 refills | Status: DC | PRN

## 2022-02-02 MED ORDER — TRAZODONE HCL 50 MG PO TABS: 50.0000 mg | ORAL_TABLET | Freq: Every day | ORAL | 1 refills | Status: DC

## 2022-02-02 MED ORDER — AMLODIPINE BESYLATE 10 MG PO TABS: 10.0000 mg | ORAL_TABLET | Freq: Every day | ORAL | 1 refills | Status: DC

## 2022-03-27 ENCOUNTER — Encounter: Payer: Self-pay | Admitting: Family Medicine

## 2022-03-27 ENCOUNTER — Other Ambulatory Visit: Payer: Self-pay | Admitting: Internal Medicine

## 2022-03-27 DIAGNOSIS — Z139 Encounter for screening, unspecified: Secondary | ICD-10-CM

## 2022-03-29 ENCOUNTER — Encounter: Payer: Self-pay | Admitting: Nurse Practitioner

## 2022-03-29 ENCOUNTER — Other Ambulatory Visit: Payer: Self-pay

## 2022-03-29 ENCOUNTER — Ambulatory Visit (INDEPENDENT_AMBULATORY_CARE_PROVIDER_SITE_OTHER): Payer: 59 | Admitting: Nurse Practitioner

## 2022-03-29 ENCOUNTER — Ambulatory Visit: Payer: 59 | Admitting: Family Medicine

## 2022-03-29 VITALS — BP 128/86 | HR 81 | Temp 98.2°F | Resp 16 | Ht 61.0 in | Wt 217.4 lb

## 2022-03-29 DIAGNOSIS — N644 Mastodynia: Secondary | ICD-10-CM

## 2022-03-29 NOTE — Progress Notes (Signed)
   BP 128/86   Pulse 81   Temp 98.2 F (36.8 C) (Oral)   Resp 16   Ht 5\' 1"  (1.549 m)   Wt 217 lb 6.4 oz (98.6 kg)   SpO2 98%   BMI 41.08 kg/m    Subjective:    Patient ID: Taylor Hurst, female    DOB: 1965/09/06, 57 y.o.   MRN: [id number]  HPI: Taylor Hurst is a 57 y.o. female  Chief Complaint  Patient presents with   Breast Pain    Right breast pain and tenderness   Referral    Mammogram   Right breast pain: patient reports she has had right breast tenderness for some time. She says it has not gone away. She denies any trauma or signs of infection.  Exam shows :   normal exam, no masses noted. .  Will order diagnostic mammogram and ultrasound.   Her last mammogram was 04/28/2021. Which showed No mammographic evidence of malignancy.   Relevant past medical, surgical, family and social history reviewed and updated as indicated. Interim medical history since our last visit reviewed. Allergies and medications reviewed and updated.  Review of Systems  Constitutional: Negative for fever or weight change.  Respiratory: Negative for cough and shortness of breath.   Cardiovascular: Negative for chest pain or palpitations.  Gastrointestinal: Negative for abdominal pain, no bowel changes.  Musculoskeletal: Negative for gait problem or joint swelling.  Skin: Negative for rash.  Neurological: Negative for dizziness or headache.  No other specific complaints in a complete review of systems (except as listed in HPI above).      Objective:    BP 128/86   Pulse 81   Temp 98.2 F (36.8 C) (Oral)   Resp 16   Ht 5\' 1"  (1.549 m)   Wt 217 lb 6.4 oz (98.6 kg)   SpO2 98%   BMI 41.08 kg/m   Wt Readings from Last 3 Encounters:  03/29/22 217 lb 6.4 oz (98.6 kg)  02/02/22 213 lb (96.6 kg)  12/20/21 211 lb 6.4 oz (95.9 kg)    Physical Exam  Constitutional: Patient appears well-developed and well-nourished. Obese  No distress.  HEENT: head atraumatic, normocephalic, pupils equal  and reactive to light, neck supple, throat within normal limits Cardiovascular: Normal rate, regular rhythm and normal heart sounds.  No murmur heard. No BLE edema. Pulmonary/Chest: Effort normal and breath sounds normal. No respiratory distress. Breasts: breasts appear normal, no suspicious masses, no skin or nipple changes or axillary nodes.  Abdominal: Soft.  There is no tenderness. Psychiatric: Patient has a normal mood and affect. behavior is normal. Judgment and thought content normal.  Results for orders placed or performed in visit on 12/20/21  [facility]  Result Value Ref Range   FSH 68.2 mIU/mL      Assessment & Plan:   Problem List Items Addressed This Visit   None Visit Diagnoses     Breast tenderness    -  Primary   right breast tenderness, nothing noted on breast exam, ordered diagnostic mammogram and ultrasound.   Relevant Orders   [place] LIMITED ULTRASOUND INCLUDING AXILLA RIGHT BREAST   MM 3D DIAGNOSTIC MAMMOGRAM UNILATERAL RIGHT BREAST        Follow up plan: Return if symptoms worsen or fail to improve.

## 2022-04-12 ENCOUNTER — Ambulatory Visit: Payer: 59

## 2022-04-12 ENCOUNTER — Ambulatory Visit
Admission: RE | Admit: 2022-04-12 | Discharge: 2022-04-12 | Disposition: A | Discharge status: Home / Self Care | Payer: 59 | Source: Ambulatory Visit | Attending: Nurse Practitioner | Admitting: Nurse Practitioner

## 2022-04-12 DIAGNOSIS — N644 Mastodynia: Secondary | ICD-10-CM

## 2022-05-02 NOTE — Progress Notes (Unsigned)
Name: Taylor Hurst   MRN: [id number]    DOB: 1965-10-24   Date:05/03/2022       Progress Note  Subjective  Chief Complaint  Follow Up  HPI  HTN: She denies dizziness, chest pain, headaches, SOB  or palpitation. Compliant with medications. BP is is at goal, continue current regiment    Morbid Obesity: she states she developed obesity after second child, went from 167 lbs to 225 lbs during pregnancy. Since Fall of 2019 she changed her diet, and we added Ozempic, it was  curbing her appetite her weight was improving , no longer taking medication because of cost, Saxenda not covered She has BMI above 40 now . She lost 5 lbs since last visit, she increase protein and fiber in her diet    OA: she is taking Tylenol during the day and tramadol qhs, she has been taking for many years. She continues to have daily aching pain. Symptoms on knees are stable, needs refill of Tramadol    GAD/MDD: she is on Lexapro , Abilify and Atarax prn  the combination is helping her mood She has a long history of depression but states Lexapro and Abilify makes her feel like her normal self. 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 Trazodone  is helping her stay and fall asleep but was still feeling tired during the day, but doing well on Modafinil , she would like to use goorx at local pharmacy   GERD/esophagitis/post-cholecystectomy diarrhea/elevated liver enzymes  : under the care of  [facility] at [facility], and is now on Nexium and Colestipol prn , she states bowel movements are down to once a day, no recent episodes of dumping . 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 Denies osb or chest pain. Pain is constant , improved with lidocaine spray, worse at the end of the day. Denies tingling or numbness. 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9) 05/30/2011   GERD (gastroesophageal reflux disease) 05/30/2011    Past Surgical History:  Procedure Laterality Date   CHOLECYSTECTOMY  07/26/2011  INTRAOPERATIVE CHOLANGIOGRAM  07/26/2011   Procedure: INTRAOPERATIVE CHOLANGIOGRAM;  Surgeon: Ardeth Sportsman, MD;  Location: [facility];  Service: General;  Laterality: N/A;   LIVER BIOPSY  07/26/2011   Procedure: LIVER BIOPSY;  Surgeon: Ardeth Sportsman, MD;  Location: [facility];  Service: General;  Laterality: N/A;  core liver biopsy    osteomilitis  1983   right femur   osteomylitis  1988   left femur   SHOULDER OPEN ROTATOR CUFF REPAIR Right 11/18/2014   Procedure: RIGHT SHOULDER MINI OPEN ROTATOR CUFF REPAIR SUBACROMIAL DECOMPRESSION  ;  Surgeon: Jene Every, MD;  Location: [facility];  Service:  Orthopedics;  Laterality: Right;    Family History  Problem Relation Age of Onset   COPD Mother    Hypertension Mother    Hyperlipidemia Mother    Glaucoma Mother    Alcohol abuse Father    Colon cancer Father    Liver cancer Father    Cancer Maternal Aunt        breast   Breast cancer Paternal Aunt        unknown age   Kidney failure Brother        He was waiting to get a kidney    Social History   Tobacco Use   Smoking status: Never   Smokeless tobacco: Never  Substance Use Topics   Alcohol use: Yes    Alcohol/week: 0.0 standard drinks of alcohol    Comment: occassionally     Current Outpatient Medications:    amLODipine (NORVASC) 10 MG tablet, Take 1 tablet (10 mg total) by mouth daily., Disp: 90 tablet, Rfl: 1   ARIPiprazole (ABILIFY) 2 MG tablet, Take 1 tablet (2 mg total) by mouth every evening., Disp: 90 tablet, Rfl: 1   b complex vitamins tablet, Take 1 tablet by mouth daily., Disp: , Rfl:    BIOTIN PO, Take 10,000 mg by mouth daily., Disp: , Rfl:    colestipol (COLESTID) 1 g tablet, Take 1 g by mouth 2 (two) times daily., Disp: , Rfl:    diclofenac Sodium (VOLTAREN) 1 % GEL, Apply topically., Disp: , Rfl:    escitalopram (LEXAPRO) 10 MG tablet, Take 1 tablet (10 mg total) by mouth daily., Disp: 90 tablet, Rfl: 1   FIBER PO, Take 5 tablets by mouth 2 (two) times daily., Disp: , Rfl:    hydrOXYzine (ATARAX) 10 MG tablet, TAKE 1 TABLET DAILY AS NEEDED, Disp: 90 tablet, Rfl: 1   ketotifen (ZADITOR) 0.025 % ophthalmic solution, 1 drop 2 (two) times daily., Disp: , Rfl:    levonorgestrel (MIRENA) 20 MCG/24HR IUD, 1 each by Intrauterine route once. , Disp: , Rfl:    Magnesium 500 MG TABS, , Disp: , Rfl:    modafinil (PROVIGIL) 100 MG tablet, Take 1 tablet (100 mg total) by mouth daily., Disp: 90 tablet, Rfl: 0   Multiple Vitamin (MULTI-VITAMIN DAILY PO), Take 1 tablet by mouth daily with breakfast. Women's, Disp: , Rfl:    Omega-3 Fatty Acids (FISH OIL) 1000 MG CAPS,  Take 1,000 mg by mouth 2 (two) times daily., Disp: , Rfl:    omeprazole (PRILOSEC) 40 MG capsule, Take by mouth., Disp: , Rfl:    traMADol (ULTRAM) 50 MG tablet, Take 1 tablet (50 mg total) by mouth daily as needed. To last 90 days, Disp: 90 tablet, Rfl: 0   traZODone (DESYREL) 50 MG tablet, Take 1 tablet (50 mg total) by mouth at bedtime., Disp: 90 tablet, Rfl: 1  valsartan-hydrochlorothiazide (DIOVAN-HCT) 320-25 MG tablet, TAKE 1 TABLET DAILY, Disp: 90 tablet, Rfl: 1  Allergies  Allergen Reactions   Percocet [Oxycodone-Acetaminophen] Anaphylaxis    bronchospasms   Povidone Iodine Rash    blisters blisters Other reaction(s): Unknown   Adhesive [Tape] Itching   Allergy Relief  [Chlorpheniramine Maleate]     Heart flutters   Oxycodone-Acetaminophen     Other reaction(s): Unknown   Chlorpheniramine     Other reaction(s): irregular heart rate   Diphenhydramine Rash   Penicillins Hives and Rash    Has patient had a PCN reaction causing immediate rash, facial/tongue/throat swelling, SOB or lightheadedness with hypotension: no Has patient had a PCN reaction causing severe rash involving mucus membranes or skin necrosis: no Has patient had a PCN reaction that required hospitalization happened in the hospital Has patient had a PCN reaction occurring within the last 10 years: no If all of the above answers are "NO", then may proceed with Cephalosporin use.     I personally reviewed active problem list, medication list, allergies, family history, social history, health maintenance with the patient/caregiver today.   ROS  Ten systems reviewed and is negative except as mentioned in HPI   Objective  Vitals:   05/03/22 1104  BP: 122/78  Pulse: 86  Resp: 16  SpO2: 98%  Weight: 212 lb (96.2 kg)  Height: 5\' 1"  (1.549 m)    Body mass index is 40.06 kg/m.  Physical Exam  Constitutional: Patient appears well-developed and well-nourished. Obese  No distress.  HEENT: head  atraumatic, normocephalic, pupils equal and reactive to light, neck supple Cardiovascular: Normal rate, regular rhythm and normal heart sounds.  No murmur heard. No BLE edema. Pulmonary/Chest: Effort normal and breath sounds normal. No respiratory distress. Abdominal: Soft.  There is no tenderness. Psychiatric: Patient has a normal mood and affect. behavior is normal. Judgment and thought content normal.   PHQ2/9:    05/03/2022   10:59 AM 03/29/2022    1:56 PM 02/02/2022    1:46 PM 11/03/2021    2:46 PM 08/03/2021    9:30 AM  Depression screen PHQ 2/9  Decreased Interest 0 0 0 2 1  Down, Depressed, Hopeless 1 0 1 0 1  PHQ - 2 Score 1 0 1 2 2   Altered sleeping 3  0 0 0  Tired, decreased energy 3  0 1 0  Change in appetite 0  2 2 0  Feeling bad or failure about yourself  0  1 1 1   Trouble concentrating 0  0 1 0  Moving slowly or fidgety/restless 0  0 0 0  Suicidal thoughts 0  0 0 0  PHQ-9 Score 7  4 7 3   Difficult doing work/chores    Somewhat difficult     phq 9 is positive   Fall Risk:    05/03/2022   10:59 AM 03/29/2022    1:55 PM 02/02/2022    1:46 PM 11/03/2021    2:45 PM 08/03/2021    9:30 AM  Fall Risk   Falls in the past year? 0 0 0 1 1  Number falls in past yr: 0 0 0 0 0  Injury with Fall? 0 0 0 0 1  Risk for fall due to : No Fall Risks  No Fall Risks History of fall(s) No Fall Risks  Follow up Falls prevention discussed  Falls prevention discussed Falls prevention discussed;Education provided;Falls evaluation completed Falls prevention discussed      Functional Status Survey: Is  the patient deaf or have difficulty hearing?: No Does the patient have difficulty seeing, even when wearing glasses/contacts?: No Does the patient have difficulty concentrating, remembering, or making decisions?: No Does the patient have difficulty walking or climbing stairs?: No Does the patient have difficulty dressing or bathing?: Yes Does the patient have difficulty doing errands  alone such as visiting a doctor's office or shopping?: No    Assessment & Plan  1. Morbid obesity  Discussed with the patient the risk posed by an increased BMI. Discussed importance of portion control, calorie counting and at least 150 minutes of physical activity weekly. Avoid sweet beverages and drink more water. Eat at least 6 servings of fruit and vegetables daily    2. Shift work sleep disorder  - modafinil (PROVIGIL) 100 MG tablet; Take 1 tablet (100 mg total) by mouth daily.  Dispense: 90 tablet; Refill: 0  3. Primary osteoarthritis of both knees  - traMADol (ULTRAM) 50 MG tablet; Take 1 tablet (50 mg total) by mouth daily as needed. To last 90 days  Dispense: 90 tablet; Refill: 0  4. Essential hypertension  - valsartan-hydrochlorothiazide (DIOVAN-HCT) 320-25 MG tablet; Take 1 tablet by mouth daily.  Dispense: 90 tablet; Refill: 1  5. Gastroesophageal reflux disease without esophagitis  Doing well at this time  6. Mild major depression  Continue medication  7. Dyslipidemia   8. Pain in both upper extremities  Continue lidocaine spray, it does not seem to be cardiac, likely from resuming reaching above heart level at work after staying off for two weeks  9. Chronic right shoulder pain  Keep visits with ortho

## 2022-05-03 ENCOUNTER — Encounter: Payer: Self-pay | Admitting: Family Medicine

## 2022-05-03 ENCOUNTER — Ambulatory Visit (INDEPENDENT_AMBULATORY_CARE_PROVIDER_SITE_OTHER): Payer: 59 | Admitting: Family Medicine

## 2022-05-03 DIAGNOSIS — G4726 Circadian rhythm sleep disorder, shift work type: Secondary | ICD-10-CM

## 2022-05-03 DIAGNOSIS — K219 Gastro-esophageal reflux disease without esophagitis: Secondary | ICD-10-CM

## 2022-05-03 DIAGNOSIS — G8929 Other chronic pain: Secondary | ICD-10-CM

## 2022-05-03 DIAGNOSIS — M79601 Pain in right arm: Secondary | ICD-10-CM

## 2022-05-03 DIAGNOSIS — M170 Bilateral primary osteoarthritis of knee: Secondary | ICD-10-CM | POA: Diagnosis not present

## 2022-05-03 DIAGNOSIS — I10 Essential (primary) hypertension: Secondary | ICD-10-CM

## 2022-05-03 DIAGNOSIS — F320 Major depressive disorder, single episode, mild: Secondary | ICD-10-CM

## 2022-05-03 DIAGNOSIS — E785 Hyperlipidemia, unspecified: Secondary | ICD-10-CM

## 2022-05-03 DIAGNOSIS — M25511 Pain in right shoulder: Secondary | ICD-10-CM

## 2022-05-03 DIAGNOSIS — M79602 Pain in left arm: Secondary | ICD-10-CM

## 2022-05-03 MED ORDER — MODAFINIL 100 MG PO TABS: 100.0000 mg | ORAL_TABLET | Freq: Every day | ORAL | 0 refills | Status: DC

## 2022-05-03 MED ORDER — VALSARTAN-HYDROCHLOROTHIAZIDE 320-25 MG PO TABS: 1.0000 | ORAL_TABLET | Freq: Every day | ORAL | 1 refills | Status: DC

## 2022-05-03 MED ORDER — TRAMADOL HCL 50 MG PO TABS: 50.0000 mg | ORAL_TABLET | Freq: Every day | ORAL | 0 refills | Status: DC | PRN

## 2022-07-09 ENCOUNTER — Other Ambulatory Visit: Payer: Self-pay | Admitting: Internal Medicine

## 2022-07-09 DIAGNOSIS — F411 Generalized anxiety disorder: Secondary | ICD-10-CM

## 2022-07-10 NOTE — Telephone Encounter (Signed)
Requested Prescriptions  Pending Prescriptions Disp Refills   hydrOXYzine (ATARAX) 10 MG tablet [Pharmacy Med Name: HYDROXYZINE HCL TABS 10MG ] 90 tablet 2    Sig: TAKE 1 TABLET DAILY AS NEEDED     Ear, Nose, and Throat:  Antihistamines 2 Passed - 07/09/2022 12:45 AM      Passed - Cr in normal range and within 360 days    Creat  Date Value Ref Range Status  08/03/2021 0.85 0.50 - 1.03 mg/dL Final         Passed - Valid encounter within last 12 months    Recent Outpatient Visits           2 months ago Morbid obesity [facility])   [facility] Alba Cory, MD   3 months ago Breast tenderness   [facility] Della Goo F, FNP   5 months ago Mild major depression [facility])   [facility] Alba Cory, MD   8 months ago Morbid obesity [facility])   [facility] Alba Cory, MD   11 months ago Mild major depression [facility])   [facility] Alba Cory, MD       Future Appointments             In 1 month Carlynn Purl, Danna Hefty, MD [facility]

## 2022-08-01 ENCOUNTER — Other Ambulatory Visit: Payer: Self-pay | Admitting: Family Medicine

## 2022-08-01 DIAGNOSIS — F320 Major depressive disorder, single episode, mild: Secondary | ICD-10-CM

## 2022-08-01 DIAGNOSIS — F411 Generalized anxiety disorder: Secondary | ICD-10-CM

## 2022-08-01 DIAGNOSIS — I10 Essential (primary) hypertension: Secondary | ICD-10-CM

## 2022-09-05 ENCOUNTER — Other Ambulatory Visit: Payer: Self-pay | Admitting: Family Medicine

## 2022-09-05 DIAGNOSIS — G4726 Circadian rhythm sleep disorder, shift work type: Secondary | ICD-10-CM

## 2022-09-06 NOTE — Progress Notes (Signed)
Name: Taylor Hurst   MRN: [id number]    DOB: [birth year]   Date:09/07/2022       Progress Note  Subjective  Chief Complaint  Follow up  HPI  HTN: She denies dizziness, chest pain, headaches, SOB  or palpitation. Compliant with medications. BP is at goal.    Morbid Obesity: she states she developed obesity after second child, went from 167 lbs to 225 lbs during pregnancy. Since Fall of 2019 she changed her diet, and we added Ozempic, it was  curbing her appetite her weight was improving , no longer taking medication because of cost, Saxenda not covered She has BMI above 40 now . She states gained weight due to mother being sick and having to eat out for months    OA: she is taking Tylenol during the day and tramadol qhs, she has been taking for many years. She continues to have daily aching pain. Symptoms on knees are stable, needs refill of Tramadol , stable    GAD/MDD: she is on Lexapro , Abilify and Atarax prn the combination is helping her mood She has a long history of depression but states Lexapro and Abilify makes her feel like her normal self. Phq 9 is higher again today, she states she is tired and worried about her mother, she had PE and had to go to rehab, she is finally back at home but she is still worried about her . 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 Trazodone  is helping her stay and fall asleep but was still feeling tired during the day, but doing well on Modafinil , she would like to use goorx at local pharmacy   GERD/esophagitis/post-cholecystectomy diarrhea/elevated liver enzymes  : under the care of  [employer] PA at [facility], and is now on Nexium and  Colestipol prn , she states bowel movements are more frequent again but has a follow up coming up with GI   Cough: started with an uri, continues to have a cough, discussed mucinex and we will add otc flonase and airsuppra for about one week. No fever or chills. Has sneezing, rhinorrhea   Rotator cuff tear right side: she had a surgery done in 2016, however pain increased and she went back to Ortho and was given steroid injections. 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   Mild major depression (HCC) 08/03/2021   Elevated liver function tests 04/08/2020   Cholelithiasis 04/08/2020   Left upper quadrant pain 04/08/2020   Esophagitis 04/08/2020   Gastric ulcer 04/08/2020   Other esophagitis without bleeding 04/08/2020   Morbid obesity (HCC) 03/28/2017   Hyperglycemia 12/22/2015   Tendonitis, Achilles, right 01/11/2015   Anxiety disorder 12/07/2014   Complete rotator cuff tear 11/18/2014   Hypercholesterolemia 07/30/2014   Osteoarthritis of both knees 07/30/2014   Chronic constipation 08/29/2011   Essential hypertension 05/30/2011   Insomnia 05/30/2011   Obesity (BMI 30-39.9) 05/30/2011   GERD (gastroesophageal reflux disease) 05/30/2011    Past Surgical History:  Procedure Laterality Date   CHOLECYSTECTOMY  07/26/2011   INTRAOPERATIVE CHOLANGIOGRAM  07/26/2011   Procedure: INTRAOPERATIVE CHOLANGIOGRAM;  Surgeon: Ardeth Sportsman, MD;  Location: [facility];  Service: General;  Laterality: N/A;   LIVER BIOPSY  07/26/2011   Procedure: LIVER BIOPSY;  Surgeon: Ardeth Sportsman, MD;  Location: [facility];  Service: General;  Laterality: N/A;  core liver biopsy    osteomilitis  1983   right femur   osteomylitis  1988   left femur   SHOULDER OPEN ROTATOR CUFF REPAIR Right 11/18/2014   Procedure: RIGHT SHOULDER MINI OPEN ROTATOR CUFF REPAIR SUBACROMIAL DECOMPRESSION  ;  Surgeon: Jene Every, MD;   Location: [facility];  Service: Orthopedics;  Laterality: Right;    Family History  Problem Relation Age of Onset   COPD Mother    Hypertension Mother    Hyperlipidemia Mother    Glaucoma Mother    Alcohol abuse Father    Colon cancer Father    Liver cancer Father    Cancer Maternal Aunt        breast   Breast cancer Paternal Aunt        unknown age   Kidney failure Brother        He was waiting to get a kidney    Social History   Tobacco Use   Smoking status: Never   Smokeless tobacco: Never  Substance Use Topics   Alcohol use: Yes    Alcohol/week: 0.0 standard drinks of alcohol    Comment: occassionally     Current Outpatient Medications:    b complex vitamins tablet, Take 1 tablet by mouth daily., Disp: , Rfl:    BIOTIN PO, Take 10,000 mg by mouth daily., Disp: , Rfl:    colestipol (COLESTID) 1 g tablet, Take 1 g by mouth 2 (two) times daily., Disp: , Rfl:    Dextromethorphan-buPROPion ER (AUVELITY) 45-105 MG TBCR, Take 1 tablet by mouth in the morning., Disp: 90 tablet, Rfl: 0   diclofenac Sodium (VOLTAREN) 1 % GEL, Apply topically., Disp: , Rfl:    FIBER PO, Take 5 tablets by mouth 2 (two) times daily., Disp: , Rfl:    hydrOXYzine (ATARAX) 10 MG tablet, TAKE 1 TABLET DAILY AS NEEDED, Disp: 90 tablet, Rfl: 2   ketotifen (ZADITOR) 0.025 % ophthalmic solution, 1 drop 2 (two) times daily., Disp: , Rfl:    levonorgestrel (MIRENA) 20 MCG/24HR IUD, 1 each by Intrauterine route once. , Disp: , Rfl:    Magnesium 500 MG TABS, , Disp: , Rfl:    meloxicam (MOBIC) 15 MG tablet, TAKE 1 TABLET BY MOUTH EVERY DAY AS NEEDED WITH FOOD, Disp: , Rfl:    methocarbamol (ROBAXIN) 500 MG tablet, Take 500 mg by mouth at bedtime as needed., Disp: , Rfl:    Multiple Vitamin (MULTI-VITAMIN DAILY PO), Take 1 tablet by mouth daily with breakfast. Women's, Disp: , Rfl:    Omega-3 Fatty Acids (FISH OIL) 1000 MG CAPS, Take 1,000 mg by mouth 2 (two) times daily., Disp: , Rfl:    omeprazole (PRILOSEC)  40 MG capsule, Take by mouth., Disp: , Rfl:    QUEtiapine (SEROQUEL) 25 MG tablet, Take 1 tablet (25 mg total) by mouth at bedtime. In place of trazodone and abilify, Disp: 90 tablet, Rfl: 0   Albuterol-Budesonide (AIRSUPRA) 90-80 MCG/ACT AERO, Inhale 2 puffs into the lungs 4 (four) times daily as needed., Disp: 10.7 g, Rfl: 0   amLODipine (NORVASC) 10 MG tablet, Take 1 tablet (10 mg total) by mouth daily., Disp: 90  tablet, Rfl: 1   escitalopram (LEXAPRO) 10 MG tablet, Take 1 tablet (10 mg total) by mouth daily., Disp: 90 tablet, Rfl: 0   modafinil (PROVIGIL) 100 MG tablet, Take 1 tablet (100 mg total) by mouth daily., Disp: 90 tablet, Rfl: 0   traMADol (ULTRAM) 50 MG tablet, Take 1 tablet (50 mg total) by mouth daily as needed. To last 90 days, Disp: 90 tablet, Rfl: 0   valsartan-hydrochlorothiazide (DIOVAN-HCT) 320-25 MG tablet, Take 1 tablet by mouth daily., Disp: 90 tablet, Rfl: 1  Allergies  Allergen Reactions   Percocet [Oxycodone-Acetaminophen] Anaphylaxis    bronchospasms   Povidone Iodine Rash    blisters blisters Other reaction(s): Unknown   Adhesive [Tape] Itching   Allergy Relief  [Chlorpheniramine Maleate]     Heart flutters   Oxycodone-Acetaminophen     Other reaction(s): Unknown   Chlorpheniramine     Other reaction(s): irregular heart rate   Diphenhydramine Rash   Penicillins Hives and Rash    Has patient had a PCN reaction causing immediate rash, facial/tongue/throat swelling, SOB or lightheadedness with hypotension: no Has patient had a PCN reaction causing severe rash involving mucus membranes or skin necrosis: no Has patient had a PCN reaction that required hospitalization happened in the hospital Has patient had a PCN reaction occurring within the last 10 years: no If all of the above answers are "NO", then may proceed with Cephalosporin use.     I personally reviewed active problem list, medication list, allergies with the patient/caregiver  today.   ROS  Ten systems reviewed and is negative except as mentioned in HPI    Objective  Vitals:   09/07/22 1139  BP: 118/74  Pulse: 86  Resp: 16  Temp: 97.8 F (36.6 C)  TempSrc: Oral  SpO2: 95%  Weight: 220 lb 12.8 oz (100.2 kg)  Height: 5\' 1"  (1.549 m)    Body mass index is 41.72 kg/m.  Physical Exam  Constitutional: Patient appears well-developed and well-nourished. Obese  No distress.  HEENT: head atraumatic, normocephalic, pupils equal and reactive to light, neck supple Cardiovascular: Normal rate, regular rhythm and normal heart sounds.  No murmur heard. No BLE edema. Pulmonary/Chest: Effort normal and breath sounds normal. No respiratory distress. Abdominal: Soft.  There is no tenderness. Psychiatric: Patient has a normal mood and affect. behavior is normal. Judgment and thought content normal.   PHQ2/9:    09/07/2022   11:39 AM 05/03/2022   10:59 AM 03/29/2022    1:56 PM 02/02/2022    1:46 PM 11/03/2021    2:46 PM  Depression screen PHQ 2/9  Decreased Interest 3 0 0 0 2  Down, Depressed, Hopeless 3 1 0 1 0  PHQ - 2 Score 6 1 0 1 2  Altered sleeping 3 3  0 0  Tired, decreased energy 3 3  0 1  Change in appetite 0 0  2 2  Feeling bad or failure about yourself  0 0  1 1  Trouble concentrating 0 0  0 1  Moving slowly or fidgety/restless 0 0  0 0  Suicidal thoughts 0 0  0 0  PHQ-9 Score 12 7  4 7   Difficult doing work/chores Very difficult    Somewhat difficult    phq 9 is positive   Fall Risk:    09/07/2022   11:38 AM 05/03/2022   10:59 AM 03/29/2022    1:55 PM 02/02/2022    1:46 PM 11/03/2021    2:45 PM  Fall  Risk   Falls in the past year? 0 0 0 0 1  Number falls in past yr: 0 0 0 0 0  Injury with Fall? 0 0 0 0 0  Risk for fall due to : No Fall Risks No Fall Risks  No Fall Risks History of fall(s)  Follow up Falls prevention discussed;Education provided;Falls evaluation completed Falls prevention discussed  Falls prevention discussed Falls  prevention discussed;Education provided;Falls evaluation completed    Assessment & Plan  1. Mild major depression (HCC)  - Dextromethorphan-buPROPion ER (AUVELITY) 45-105 MG TBCR; Take 1 tablet by mouth in the morning.  Dispense: 90 tablet; Refill: 0 - QUEtiapine (SEROQUEL) 25 MG tablet; Take 1 tablet (25 mg total) by mouth at bedtime. In place of trazodone and abilify  Dispense: 90 tablet; Refill: 0 - escitalopram (LEXAPRO) 10 MG tablet; Take 1 tablet (10 mg total) by mouth daily.  Dispense: 90 tablet; Refill: 0  2. Morbid obesity (HCC)  Discussed with the patient the risk posed by an increased BMI. Discussed importance of portion control, calorie counting and at least 150 minutes of physical activity weekly. Avoid sweet beverages and drink more water. Eat at least 6 servings of fruit and vegetables daily    3. Dyslipidemia  - Lipid panel  4. Essential hypertension  - valsartan-hydrochlorothiazide (DIOVAN-HCT) 320-25 MG tablet; Take 1 tablet by mouth daily.  Dispense: 90 tablet; Refill: 1 - amLODipine (NORVASC) 10 MG tablet; Take 1 tablet (10 mg total) by mouth daily.  Dispense: 90 tablet; Refill: 1 - CBC with Differential/Platelet - COMPLETE METABOLIC PANEL WITH GFR  5. Shift work sleep disorder  - modafinil (PROVIGIL) 100 MG tablet; Take 1 tablet (100 mg total) by mouth daily.  Dispense: 90 tablet; Refill: 0  6. Primary osteoarthritis of both knees  - traMADol (ULTRAM) 50 MG tablet; Take 1 tablet (50 mg total) by mouth daily as needed. To last 90 days  Dispense: 90 tablet; Refill: 0  7. Gastroesophageal reflux disease without esophagitis   8. Other insomnia  - QUEtiapine (SEROQUEL) 25 MG tablet; Take 1 tablet (25 mg total) by mouth at bedtime. In place of trazodone and abilify  Dispense: 90 tablet; Refill: 0  9. Generalized anxiety disorder  - escitalopram (LEXAPRO) 10 MG tablet; Take 1 tablet (10 mg total) by mouth daily.  Dispense: 90 tablet; Refill: 0  10. Subacute  cough  - Albuterol-Budesonide (AIRSUPRA) 90-80 MCG/ACT AERO; Inhale 2 puffs into the lungs 4 (four) times daily as needed.  Dispense: 10.7 g; Refill: 0

## 2022-09-07 ENCOUNTER — Other Ambulatory Visit: Payer: Self-pay | Admitting: Family Medicine

## 2022-09-07 ENCOUNTER — Ambulatory Visit (INDEPENDENT_AMBULATORY_CARE_PROVIDER_SITE_OTHER): Payer: Managed Care, Other (non HMO) | Admitting: Family Medicine

## 2022-09-07 ENCOUNTER — Encounter: Payer: Self-pay | Admitting: Family Medicine

## 2022-09-07 VITALS — BP 118/74 | HR 86 | Temp 97.8°F | Resp 16 | Ht 61.0 in | Wt 220.8 lb

## 2022-09-07 DIAGNOSIS — F320 Major depressive disorder, single episode, mild: Secondary | ICD-10-CM | POA: Diagnosis not present

## 2022-09-07 DIAGNOSIS — G4709 Other insomnia: Secondary | ICD-10-CM

## 2022-09-07 DIAGNOSIS — E785 Hyperlipidemia, unspecified: Secondary | ICD-10-CM

## 2022-09-07 DIAGNOSIS — R052 Subacute cough: Secondary | ICD-10-CM

## 2022-09-07 DIAGNOSIS — I10 Essential (primary) hypertension: Secondary | ICD-10-CM | POA: Diagnosis not present

## 2022-09-07 DIAGNOSIS — K219 Gastro-esophageal reflux disease without esophagitis: Secondary | ICD-10-CM

## 2022-09-07 DIAGNOSIS — M170 Bilateral primary osteoarthritis of knee: Secondary | ICD-10-CM

## 2022-09-07 DIAGNOSIS — G4726 Circadian rhythm sleep disorder, shift work type: Secondary | ICD-10-CM

## 2022-09-07 DIAGNOSIS — F411 Generalized anxiety disorder: Secondary | ICD-10-CM

## 2022-09-07 MED ORDER — AUVELITY 45-105 MG PO TBCR
1.0000 | EXTENDED_RELEASE_TABLET | Freq: Every morning | ORAL | 0 refills | Status: AC
Start: 2022-09-07 — End: ?

## 2022-09-07 MED ORDER — VALSARTAN-HYDROCHLOROTHIAZIDE 320-25 MG PO TABS
1.0000 | ORAL_TABLET | Freq: Every day | ORAL | 1 refills | Status: DC
Start: 2022-09-07 — End: 2023-04-12

## 2022-09-07 MED ORDER — ESCITALOPRAM OXALATE 10 MG PO TABS
10.0000 mg | ORAL_TABLET | Freq: Every day | ORAL | 0 refills | Status: DC
Start: 2022-09-07 — End: 2022-12-07

## 2022-09-07 MED ORDER — QUETIAPINE FUMARATE 25 MG PO TABS: 25.0000 mg | ORAL_TABLET | Freq: Every day | ORAL | 0 refills | Status: DC

## 2022-09-07 MED ORDER — AIRSUPRA 90-80 MCG/ACT IN AERO
2.0000 | INHALATION_SPRAY | Freq: Four times a day (QID) | RESPIRATORY_TRACT | 0 refills | Status: DC | PRN
Start: 2022-09-07 — End: 2022-09-07

## 2022-09-07 MED ORDER — MODAFINIL 100 MG PO TABS
100.0000 mg | ORAL_TABLET | Freq: Every day | ORAL | 0 refills | Status: DC
Start: 2022-09-07 — End: 2022-12-07

## 2022-09-07 MED ORDER — AMLODIPINE BESYLATE 10 MG PO TABS
10.0000 mg | ORAL_TABLET | Freq: Every day | ORAL | 1 refills | Status: DC
Start: 2022-09-07 — End: 2023-04-12

## 2022-09-07 MED ORDER — TRAMADOL HCL 50 MG PO TABS
50.0000 mg | ORAL_TABLET | Freq: Every day | ORAL | 0 refills | Status: DC | PRN
Start: 2022-09-07 — End: 2022-12-07

## 2022-09-07 NOTE — Patient Instructions (Signed)
Stop trazodone and Abilify  Start quetiapine at night for sleep New medication for depression is Auvelity  Continue lexapro for now

## 2022-09-08 LAB — COMPLETE METABOLIC PANEL WITH GFR
AG Ratio: 1.7 (calc) (ref 1.0–2.5)
ALT: 20 U/L (ref 6–29)
AST: 18 U/L (ref 10–35)
Albumin: 3.8 g/dL (ref 3.6–5.1)
Alkaline phosphatase (APISO): 56 U/L (ref 37–153)
BUN: 16 mg/dL (ref 7–25)
CO2: 34 mmol/L — ABNORMAL HIGH (ref 20–32)
Calcium: 9.2 mg/dL (ref 8.6–10.4)
Chloride: 101 mmol/L (ref 98–110)
Creat: 0.71 mg/dL (ref 0.50–1.03)
Globulin: 2.2 g/dL (ref 1.9–3.7)
Glucose, Bld: 94 mg/dL (ref 65–99)
Potassium: 3.8 mmol/L (ref 3.5–5.3)
Sodium: 143 mmol/L (ref 135–146)
Total Bilirubin: 0.5 mg/dL (ref 0.2–1.2)
Total Protein: 6 g/dL — ABNORMAL LOW (ref 6.1–8.1)
eGFR: 99 mL/min/{1.73_m2} (ref >=60)

## 2022-09-08 LAB — LIPID PANEL
Cholesterol: 205 mg/dL — ABNORMAL HIGH (ref <200)
HDL: 63 mg/dL (ref >=50)
LDL Cholesterol (Calc): 122 mg/dL — ABNORMAL HIGH
Non-HDL Cholesterol (Calc): 142 mg/dL — ABNORMAL HIGH (ref <130)
Total CHOL/HDL Ratio: 3.3 (calc) (ref <5.0)
Triglycerides: 94 mg/dL (ref <150)

## 2022-09-08 LAB — CBC WITH DIFFERENTIAL/PLATELET
Absolute Monocytes: 619 {cells}/uL (ref 200–950)
Basophils Absolute: 43 {cells}/uL (ref 0–200)
Basophils Relative: 0.6 %
Eosinophils Absolute: 439 {cells}/uL (ref 15–500)
Eosinophils Relative: 6.1 %
HCT: 37.3 % (ref 35.0–45.0)
Hemoglobin: 12.2 g/dL (ref 11.7–15.5)
Lymphs Abs: 2599 {cells}/uL (ref 850–3900)
MCH: 29.5 pg (ref 27.0–33.0)
MCHC: 32.7 g/dL (ref 32.0–36.0)
MCV: 90.3 fL (ref 80.0–100.0)
MPV: 11 fL (ref 7.5–12.5)
Monocytes Relative: 8.6 %
Neutro Abs: 3499 {cells}/uL (ref 1500–7800)
Neutrophils Relative %: 48.6 %
Platelets: 339 10*3/uL (ref 140–400)
RBC: 4.13 10*6/uL (ref 3.80–5.10)
RDW: 12.4 % (ref 11.0–15.0)
Total Lymphocyte: 36.1 %
WBC: 7.2 10*3/uL (ref 3.8–10.8)

## 2022-09-12 ENCOUNTER — Other Ambulatory Visit: Payer: Self-pay | Admitting: Family Medicine

## 2022-09-12 DIAGNOSIS — F320 Major depressive disorder, single episode, mild: Secondary | ICD-10-CM

## 2022-09-12 MED ORDER — AUVELITY 45-105 MG PO TBCR
1.0000 | EXTENDED_RELEASE_TABLET | Freq: Every morning | ORAL | 0 refills | Status: DC
Start: 2022-09-12 — End: 2022-12-07

## 2022-09-27 NOTE — Progress Notes (Signed)
Name: Taylor Hurst   MRN: [id number]    DOB: Jan 27, 1965   Date:09/28/2022       Progress Note  Subjective  Chief Complaint  Follow Up  HPI  GAD/MDD: she was  on Lexapro , Abilify, Trazodone  and Atarax prn for a while and Phq 9 was increasing, she was very worried about her mother but currently she is doing better. We added Auvelity, stopped trazodone and abilify , added Seroquel at night, she states she feels more relaxed .    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   Mild major depression (HCC) 08/03/2021   Elevated liver function tests 04/08/2020   Cholelithiasis 04/08/2020   Left upper quadrant pain 04/08/2020   Esophagitis 04/08/2020   Gastric ulcer 04/08/2020   Other esophagitis without bleeding 04/08/2020   Morbid obesity (HCC) 03/28/2017   Hyperglycemia 12/22/2015   Tendonitis, Achilles, right 01/11/2015   Anxiety disorder 12/07/2014   Complete rotator cuff tear 11/18/2014   Hypercholesterolemia 07/30/2014   Osteoarthritis of both knees 07/30/2014   Chronic constipation 08/29/2011   Essential hypertension 05/30/2011   Insomnia 05/30/2011   Obesity (BMI 30-39.9) 05/30/2011   GERD (gastroesophageal reflux disease) 05/30/2011    Past Surgical History:  Procedure Laterality Date   CHOLECYSTECTOMY  07/26/2011   INTRAOPERATIVE CHOLANGIOGRAM  07/26/2011   Procedure: INTRAOPERATIVE CHOLANGIOGRAM;  Surgeon: Ardeth Sportsman, MD;  Location: [facility];  Service: General;  Laterality: N/A;   LIVER BIOPSY  07/26/2011   Procedure: LIVER BIOPSY;  Surgeon: Ardeth Sportsman, MD;  Location: [facility];  Service: General;  Laterality: N/A;  core liver biopsy    osteomilitis  1983   right femur   osteomylitis  1988   left  femur   SHOULDER OPEN ROTATOR CUFF REPAIR Right 11/18/2014   Procedure: RIGHT SHOULDER MINI OPEN ROTATOR CUFF REPAIR SUBACROMIAL DECOMPRESSION  ;  Surgeon: Jene Every, MD;  Location: [facility];  Service: Orthopedics;  Laterality: Right;    Family History  Problem Relation Age of Onset   COPD Mother    Hypertension Mother    Hyperlipidemia Mother    Glaucoma Mother    Alcohol abuse Father    Colon cancer Father    Liver cancer Father    Cancer Maternal Aunt        breast   Breast cancer Paternal Aunt        unknown age   Kidney failure Brother        He was waiting to get a kidney    Social History   Tobacco Use   Smoking status: Never   Smokeless tobacco: Never  Substance Use Topics   Alcohol use: Yes    Alcohol/week: 0.0 standard drinks of alcohol    Comment: occassionally     Current Outpatient Medications:    Albuterol-Budesonide (AIRSUPRA) 90-80 MCG/ACT AERO, INHALE 2 PUFFS INTO THE LUNGS 4 TIMES DAILY AS NEEDED., Disp: 90 g, Rfl: 0   amLODipine (NORVASC) 10 MG tablet, Take 1 tablet (10 mg total) by mouth daily., Disp: 90 tablet, Rfl: 1   b complex vitamins tablet, Take 1 tablet by mouth daily., Disp: , Rfl:    BIOTIN PO, Take 10,000 mg by  mouth daily., Disp: , Rfl:    colestipol (COLESTID) 1 g tablet, Take 1 g by mouth 2 (two) times daily., Disp: , Rfl:    Dextromethorphan-buPROPion ER (AUVELITY) 45-105 MG TBCR, Take 1 tablet by mouth in the morning., Disp: 90 tablet, Rfl: 0   diclofenac Sodium (VOLTAREN) 1 % GEL, Apply topically., Disp: , Rfl:    escitalopram (LEXAPRO) 10 MG tablet, Take 1 tablet (10 mg total) by mouth daily., Disp: 90 tablet, Rfl: 0   FIBER PO, Take 5 tablets by mouth 2 (two) times daily., Disp: , Rfl:    hydrOXYzine (ATARAX) 10 MG tablet, TAKE 1 TABLET DAILY AS NEEDED, Disp: 90 tablet, Rfl: 2   ketotifen (ZADITOR) 0.025 % ophthalmic solution, 1 drop 2 (two) times daily., Disp: , Rfl:    levonorgestrel (MIRENA) 20 MCG/24HR IUD, 1 each by  Intrauterine route once. , Disp: , Rfl:    Magnesium 500 MG TABS, , Disp: , Rfl:    meloxicam (MOBIC) 15 MG tablet, TAKE 1 TABLET BY MOUTH EVERY DAY AS NEEDED WITH FOOD, Disp: , Rfl:    methocarbamol (ROBAXIN) 500 MG tablet, Take 500 mg by mouth at bedtime as needed., Disp: , Rfl:    modafinil (PROVIGIL) 100 MG tablet, Take 1 tablet (100 mg total) by mouth daily., Disp: 90 tablet, Rfl: 0   Multiple Vitamin (MULTI-VITAMIN DAILY PO), Take 1 tablet by mouth daily with breakfast. Women's, Disp: , Rfl:    Omega-3 Fatty Acids (FISH OIL) 1000 MG CAPS, Take 1,000 mg by mouth 2 (two) times daily., Disp: , Rfl:    omeprazole (PRILOSEC) 40 MG capsule, Take by mouth., Disp: , Rfl:    QUEtiapine (SEROQUEL) 25 MG tablet, Take 1 tablet (25 mg total) by mouth at bedtime. In place of trazodone and abilify, Disp: 90 tablet, Rfl: 0   traMADol (ULTRAM) 50 MG tablet, Take 1 tablet (50 mg total) by mouth daily as needed. To last 90 days, Disp: 90 tablet, Rfl: 0   valsartan-hydrochlorothiazide (DIOVAN-HCT) 320-25 MG tablet, Take 1 tablet by mouth daily., Disp: 90 tablet, Rfl: 1  Allergies  Allergen Reactions   Percocet [Oxycodone-Acetaminophen] Anaphylaxis    bronchospasms   Povidone Iodine Rash    blisters blisters Other reaction(s): Unknown   Adhesive [Tape] Itching   Allergy Relief  [Chlorpheniramine Maleate]     Heart flutters   Oxycodone-Acetaminophen     Other reaction(s): Unknown   Chlorpheniramine     Other reaction(s): irregular heart rate   Diphenhydramine Rash   Penicillins Hives and Rash    Has patient had a PCN reaction causing immediate rash, facial/tongue/throat swelling, SOB or lightheadedness with hypotension: no Has patient had a PCN reaction causing severe rash involving mucus membranes or skin necrosis: no Has patient had a PCN reaction that required hospitalization happened in the hospital Has patient had a PCN reaction occurring within the last 10 years: no If all of the above answers  are "NO", then may proceed with Cephalosporin use.     I personally reviewed active problem list, medication list, allergies, family history, social history, health maintenance with the patient/caregiver today.   ROS  Ten systems reviewed and is negative except as mentioned in HPI    Objective  Vitals:   09/28/22 1425  BP: 126/78  Pulse: 89  Resp: 16  SpO2: 97%  Weight: 218 lb (98.9 kg)  Height: 5\' 1"  (1.549 m)    Body mass index is 41.19 kg/m.  Physical Exam  Constitutional: Patient appears  well-developed and well-nourished. Obese No distress.  HEENT: head atraumatic, normocephalic, pupils equal and reactive to light, , neck supple, throat within normal limits Cardiovascular: Normal rate, regular rhythm and normal heart sounds.  No murmur heard. No BLE edema. Pulmonary/Chest: Effort normal and breath sounds normal. No respiratory distress. Abdominal: Soft.  There is no tenderness. Psychiatric: Patient has a normal mood and affect. behavior is normal. Judgment and thought content normal.    PHQ2/9:    09/28/2022    2:29 PM 09/07/2022   11:39 AM 05/03/2022   10:59 AM 03/29/2022    1:56 PM 02/02/2022    1:46 PM  Depression screen PHQ 2/9  Decreased Interest 0 3 0 0 0  Down, Depressed, Hopeless 1 3 1  0 1  PHQ - 2 Score 1 6 1  0 1  Altered sleeping 0 3 3  0  Tired, decreased energy 0 3 3  0  Change in appetite 0 0 0  2  Feeling bad or failure about yourself  0 0 0  1  Trouble concentrating 0 0 0  0  Moving slowly or fidgety/restless 0 0 0  0  Suicidal thoughts 0 0 0  0  PHQ-9 Score 1 12 7  4   Difficult doing work/chores  Very difficult       phq 9 is negative   Fall Risk:    09/28/2022    2:24 PM 09/07/2022   11:38 AM 05/03/2022   10:59 AM 03/29/2022    1:55 PM 02/02/2022    1:46 PM  Fall Risk   Falls in the past year? 0 0 0 0 0  Number falls in past yr: 0 0 0 0 0  Injury with Fall? 0 0 0 0 0  Risk for fall due to : No Fall Risks No Fall Risks No Fall Risks   No Fall Risks  Follow up Falls prevention discussed Falls prevention discussed;Education provided;Falls evaluation completed Falls prevention discussed  Falls prevention discussed      Functional Status Survey: Is the patient deaf or have difficulty hearing?: No Does the patient have difficulty seeing, even when wearing glasses/contacts?: No Does the patient have difficulty concentrating, remembering, or making decisions?: No Does the patient have difficulty walking or climbing stairs?: No Does the patient have difficulty dressing or bathing?: No Does the patient have difficulty doing errands alone such as visiting a doctor's office or shopping?: No    Assessment & Plan  1. Other cough  - Ambulatory referral to Pulmonology  We will also check CXR and start Zpack   2. Major depression in remission (HCC)

## 2022-09-28 ENCOUNTER — Ambulatory Visit
Admission: RE | Admit: 2022-09-28 | Discharge: 2022-09-28 | Disposition: A | Discharge status: Home / Self Care | Payer: 59 | Attending: Family Medicine | Admitting: Family Medicine

## 2022-09-28 ENCOUNTER — Ambulatory Visit (INDEPENDENT_AMBULATORY_CARE_PROVIDER_SITE_OTHER): Payer: 59 | Admitting: Family Medicine

## 2022-09-28 ENCOUNTER — Ambulatory Visit
Admission: RE | Admit: 2022-09-28 | Discharge: 2022-09-28 | Disposition: A | Discharge status: Home / Self Care | Payer: 59 | Source: Ambulatory Visit | Attending: Family Medicine | Admitting: Family Medicine

## 2022-09-28 ENCOUNTER — Encounter: Payer: Self-pay | Admitting: Family Medicine

## 2022-09-28 VITALS — BP 126/78 | HR 89 | Resp 16 | Ht 61.0 in | Wt 218.0 lb

## 2022-09-28 DIAGNOSIS — F325 Major depressive disorder, single episode, in full remission: Secondary | ICD-10-CM | POA: Diagnosis not present

## 2022-09-28 DIAGNOSIS — R058 Other specified cough: Secondary | ICD-10-CM | POA: Diagnosis present

## 2022-09-28 MED ORDER — AZITHROMYCIN 250 MG PO TABS: ORAL_TABLET | ORAL | 0 refills | Status: AC

## 2022-11-01 ENCOUNTER — Institutional Professional Consult (permissible substitution): Payer: 59 | Admitting: Pulmonary Disease

## 2022-12-06 ENCOUNTER — Telehealth: Payer: Self-pay | Admitting: Licensed Practical Nurse

## 2022-12-06 NOTE — Telephone Encounter (Signed)
Left message for patient to call office back to schedule annual appt with LMD after 12/21/22

## 2022-12-06 NOTE — Progress Notes (Signed)
Name: Taylor Hurst   MRN: [id number]    DOB: [birth year]   Date:12/07/2022       Progress Note  Subjective  Chief Complaint  Follow Up  HPI  HTN: She denies dizziness, chest pain, headaches, SOB . She is compliant with medications    Morbid Obesity: she states she developed obesity after second child, went from 167 lbs to 225 lbs during pregnancy. Since Fall of 2019 she changed her diet, and we added Ozempic, it was curbing her appetite her weight was improving , no longer taking medication because of cost, Saxenda not covered She has BMI above 40 now . She continue to gain weight today is 221 lbs.   OA: she is taking Tylenol during the day and tramadol qhs, she has been taking for many years. She continues to have daily aching pain. Symptoms on knees are stable, she takes Tramadol and it controls symptoms    GAD/MDD: since August she has been on Auvelity, lexapro and seroquel. She has been sleeping better. Mood improved, she is upset and crying since her son has been upset at her .    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 She takes modafinil and works well for her   GERD/esophagitis/post-cholecystectomy diarrhea/elevated liver enzymes  : under the care of  [employer] PA at [facility], and is now on Nexium and Colestipol prn. 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   Mild major depression (HCC) 08/03/2021   Elevated liver function tests 04/08/2020   Cholelithiasis 04/08/2020   Left upper quadrant pain 04/08/2020   Esophagitis 04/08/2020   Gastric ulcer 04/08/2020   Other esophagitis without bleeding 04/08/2020   Morbid obesity (HCC) 03/28/2017   Hyperglycemia 12/22/2015   Tendonitis, Achilles, right 01/11/2015   Anxiety disorder 12/07/2014    Complete rotator cuff tear 11/18/2014   Hypercholesterolemia 07/30/2014   Osteoarthritis of both knees 07/30/2014   Chronic constipation 08/29/2011   Essential hypertension 05/30/2011   Insomnia 05/30/2011   Obesity (BMI 30-39.9) 05/30/2011   GERD (gastroesophageal reflux disease) 05/30/2011    Past Surgical History:  Procedure Laterality Date   CHOLECYSTECTOMY  07/26/2011   INTRAOPERATIVE CHOLANGIOGRAM  07/26/2011   Procedure: INTRAOPERATIVE CHOLANGIOGRAM;  Surgeon: Ardeth Sportsman, MD;  Location: [facility];  Service: General;  Laterality: N/A;   LIVER BIOPSY  07/26/2011   Procedure: LIVER BIOPSY;  Surgeon: Ardeth Sportsman, MD;  Location: [facility];  Service: General;  Laterality: N/A;  core liver biopsy    osteomilitis  1983   right femur   osteomylitis  1988   left femur   SHOULDER OPEN ROTATOR CUFF REPAIR Right 11/18/2014   Procedure: RIGHT SHOULDER MINI OPEN ROTATOR CUFF REPAIR SUBACROMIAL DECOMPRESSION  ;  Surgeon: Jene Every, MD;  Location: [facility];  Service: Orthopedics;  Laterality: Right;    Family History  Problem Relation Age of Onset   COPD Mother    Hypertension Mother    Hyperlipidemia Mother    Glaucoma Mother    Alcohol abuse Father    Colon cancer Father    Liver cancer Father    Cancer Maternal Aunt        breast   Breast cancer Paternal Aunt  unknown age   Kidney failure Brother        He was waiting to get a kidney    Social History   Tobacco Use   Smoking status: Never   Smokeless tobacco: Never  Substance Use Topics   Alcohol use: Yes    Alcohol/week: 0.0 standard drinks of alcohol    Comment: occassionally     Current Outpatient Medications:    Albuterol-Budesonide (AIRSUPRA) 90-80 MCG/ACT AERO, INHALE 2 PUFFS INTO THE LUNGS 4 TIMES DAILY AS NEEDED., Disp: 90 g, Rfl: 0   amLODipine (NORVASC) 10 MG tablet, Take 1 tablet (10 mg total) by mouth daily., Disp: 90 tablet, Rfl: 1   b complex vitamins tablet, Take 1 tablet by mouth daily.,  Disp: , Rfl:    BIOTIN PO, Take 10,000 mg by mouth daily., Disp: , Rfl:    colestipol (COLESTID) 1 g tablet, Take 1 g by mouth 2 (two) times daily., Disp: , Rfl:    Dextromethorphan-buPROPion ER (AUVELITY) 45-105 MG TBCR, Take 1 tablet by mouth in the morning., Disp: 90 tablet, Rfl: 0   diclofenac Sodium (VOLTAREN) 1 % GEL, Apply topically., Disp: , Rfl:    escitalopram (LEXAPRO) 10 MG tablet, Take 1 tablet (10 mg total) by mouth daily., Disp: 90 tablet, Rfl: 0   FIBER PO, Take 5 tablets by mouth 2 (two) times daily., Disp: , Rfl:    hydrOXYzine (ATARAX) 10 MG tablet, TAKE 1 TABLET DAILY AS NEEDED, Disp: 90 tablet, Rfl: 2   ketotifen (ZADITOR) 0.025 % ophthalmic solution, 1 drop 2 (two) times daily., Disp: , Rfl:    levonorgestrel (MIRENA) 20 MCG/24HR IUD, 1 each by Intrauterine route once. , Disp: , Rfl:    Magnesium 500 MG TABS, , Disp: , Rfl:    meloxicam (MOBIC) 15 MG tablet, TAKE 1 TABLET BY MOUTH EVERY DAY AS NEEDED WITH FOOD, Disp: , Rfl:    methocarbamol (ROBAXIN) 500 MG tablet, Take 500 mg by mouth at bedtime as needed., Disp: , Rfl:    modafinil (PROVIGIL) 100 MG tablet, Take 1 tablet (100 mg total) by mouth daily., Disp: 90 tablet, Rfl: 0   Multiple Vitamin (MULTI-VITAMIN DAILY PO), Take 1 tablet by mouth daily with breakfast. Women's, Disp: , Rfl:    Omega-3 Fatty Acids (FISH OIL) 1000 MG CAPS, Take 1,000 mg by mouth 2 (two) times daily., Disp: , Rfl:    omeprazole (PRILOSEC) 40 MG capsule, Take by mouth., Disp: , Rfl:    QUEtiapine (SEROQUEL) 25 MG tablet, Take 1 tablet (25 mg total) by mouth at bedtime. In place of trazodone and abilify, Disp: 90 tablet, Rfl: 0   traMADol (ULTRAM) 50 MG tablet, Take 1 tablet (50 mg total) by mouth daily as needed. To last 90 days, Disp: 90 tablet, Rfl: 0   valsartan-hydrochlorothiazide (DIOVAN-HCT) 320-25 MG tablet, Take 1 tablet by mouth daily., Disp: 90 tablet, Rfl: 1  Allergies  Allergen Reactions   Percocet [Oxycodone-Acetaminophen]  Anaphylaxis    bronchospasms   Povidone Iodine Rash    blisters blisters Other reaction(s): Unknown   Adhesive [Tape] Itching   Allergy Relief  [Chlorpheniramine Maleate]     Heart flutters   Oxycodone-Acetaminophen     Other reaction(s): Unknown   Chlorpheniramine     Other reaction(s): irregular heart rate   Diphenhydramine Rash   Penicillins Hives and Rash    Has patient had a PCN reaction causing immediate rash, facial/tongue/throat swelling, SOB or lightheadedness with hypotension: no Has patient had a PCN  reaction causing severe rash involving mucus membranes or skin necrosis: no Has patient had a PCN reaction that required hospitalization happened in the hospital Has patient had a PCN reaction occurring within the last 10 years: no If all of the above answers are "NO", then may proceed with Cephalosporin use.     I personally reviewed active problem list, medication list, allergies, family history, social history, health maintenance with the patient/caregiver today.   ROS  Ten systems reviewed and is negative except as mentioned in HPI    Objective  Vitals:   12/07/22 1351  BP: 128/72  Pulse: 91  Resp: 16  Temp: 97.7 F (36.5 C)  TempSrc: Oral  SpO2: 96%  Weight: 221 lb 8 oz (100.5 kg)  Height: 5\' 1"  (1.549 m)    Body mass index is 41.85 kg/m.  Physical Exam  Constitutional: Patient appears well-developed and well-nourished. Obese  No distress.  HEENT: head atraumatic, normocephalic, pupils equal and reactive to light, neck supple Cardiovascular: Normal rate, regular rhythm and normal heart sounds.  No murmur heard. No BLE edema. Pulmonary/Chest: Effort normal and breath sounds normal. No respiratory distress. Abdominal: Soft.  There is no tenderness. Psychiatric: Patient has a normal mood and affect. behavior is normal. Judgment and thought content normal.    PHQ2/9:    12/07/2022    1:53 PM 09/28/2022    2:29 PM 09/07/2022   11:39 AM 05/03/2022    10:59 AM 03/29/2022    1:56 PM  Depression screen PHQ 2/9  Decreased Interest 1 0 3 0 0  Down, Depressed, Hopeless 1 1 3 1  0  PHQ - 2 Score 2 1 6 1  0  Altered sleeping 0 0 3 3   Tired, decreased energy 1 0 3 3   Change in appetite 0 0 0 0   Feeling bad or failure about yourself  0 0 0 0   Trouble concentrating 0 0 0 0   Moving slowly or fidgety/restless 0 0 0 0   Suicidal thoughts 0 0 0 0   PHQ-9 Score 3 1 12 7    Difficult doing work/chores Not difficult at all  Very difficult      phq 9 is positive   Fall Risk:    12/07/2022    1:52 PM 09/28/2022    2:24 PM 09/07/2022   11:38 AM 05/03/2022   10:59 AM 03/29/2022    1:55 PM  Fall Risk   Falls in the past year? 0 0 0 0 0  Number falls in past yr:  0 0 0 0  Injury with Fall?  0 0 0 0  Risk for fall due to : No Fall Risks No Fall Risks No Fall Risks No Fall Risks   Follow up Falls prevention discussed Falls prevention discussed Falls prevention discussed;Education provided;Falls evaluation completed Falls prevention discussed       Functional Status Survey: Is the patient deaf or have difficulty hearing?: No Does the patient have difficulty seeing, even when wearing glasses/contacts?: No Does the patient have difficulty concentrating, remembering, or making decisions?: No Does the patient have difficulty walking or climbing stairs?: No Does the patient have difficulty dressing or bathing?: No Does the patient have difficulty doing errands alone such as visiting a doctor's office or shopping?: No    Assessment & Plan  1. Major depression in Remission (HCC)  - QUEtiapine (SEROQUEL) 25 MG tablet; Take 1 tablet (25 mg total) by mouth at bedtime. In place of trazodone and abilify  Dispense: 90 tablet; Refill: 1 - escitalopram (LEXAPRO) 10 MG tablet; Take 1 tablet (10 mg total) by mouth daily.  Dispense: 90 tablet; Refill: 1 - Dextromethorphan-buPROPion ER (AUVELITY) 45-105 MG TBCR; Take 1 tablet by mouth in the morning.   Dispense: 90 tablet; Refill: 0  2. Other insomnia  - QUEtiapine (SEROQUEL) 25 MG tablet; Take 1 tablet (25 mg total) by mouth at bedtime. In place of trazodone and abilify  Dispense: 90 tablet; Refill: 1  3. Essential hypertension  BP is at goal   4. Primary osteoarthritis of both knees  - traMADol (ULTRAM) 50 MG tablet; Take 1 tablet (50 mg total) by mouth daily as needed. To last 90 days  Dispense: 90 tablet; Refill: 0  5. Shift work sleep disorder  - modafinil (PROVIGIL) 100 MG tablet; Take 1 tablet (100 mg total) by mouth daily.  Dispense: 90 tablet; Refill: 0  6. Generalized anxiety disorder  - escitalopram (LEXAPRO) 10 MG tablet; Take 1 tablet (10 mg total) by mouth daily.  Dispense: 90 tablet; Refill: 1

## 2022-12-07 ENCOUNTER — Ambulatory Visit (INDEPENDENT_AMBULATORY_CARE_PROVIDER_SITE_OTHER): Payer: 59 | Admitting: Family Medicine

## 2022-12-07 DIAGNOSIS — F325 Major depressive disorder, single episode, in full remission: Secondary | ICD-10-CM

## 2022-12-07 DIAGNOSIS — M170 Bilateral primary osteoarthritis of knee: Secondary | ICD-10-CM

## 2022-12-07 DIAGNOSIS — G4709 Other insomnia: Secondary | ICD-10-CM

## 2022-12-07 DIAGNOSIS — F411 Generalized anxiety disorder: Secondary | ICD-10-CM

## 2022-12-07 DIAGNOSIS — I10 Essential (primary) hypertension: Secondary | ICD-10-CM

## 2022-12-07 DIAGNOSIS — G4726 Circadian rhythm sleep disorder, shift work type: Secondary | ICD-10-CM

## 2022-12-07 MED ORDER — TRAMADOL HCL 50 MG PO TABS: 50.0000 mg | ORAL_TABLET | Freq: Every day | ORAL | 0 refills | Status: DC | PRN

## 2022-12-07 MED ORDER — MODAFINIL 100 MG PO TABS: 100.0000 mg | ORAL_TABLET | Freq: Every day | ORAL | 0 refills | Status: DC

## 2022-12-07 MED ORDER — AUVELITY 45-105 MG PO TBCR: 1.0000 | EXTENDED_RELEASE_TABLET | Freq: Every morning | ORAL | 0 refills | Status: DC

## 2022-12-07 MED ORDER — QUETIAPINE FUMARATE 25 MG PO TABS: 25.0000 mg | ORAL_TABLET | Freq: Every day | ORAL | 1 refills | Status: DC

## 2022-12-07 MED ORDER — ESCITALOPRAM OXALATE 10 MG PO TABS: 10.0000 mg | ORAL_TABLET | Freq: Every day | ORAL | 1 refills | Status: DC

## 2023-01-03 ENCOUNTER — Other Ambulatory Visit (HOSPITAL_COMMUNITY)
Admission: RE | Admit: 2023-01-03 | Discharge: 2023-01-03 | Disposition: A | Discharge status: Home / Self Care | Payer: Managed Care, Other (non HMO) | Source: Ambulatory Visit | Attending: Licensed Practical Nurse | Admitting: Licensed Practical Nurse

## 2023-01-03 ENCOUNTER — Encounter: Payer: Self-pay | Admitting: Licensed Practical Nurse

## 2023-01-03 ENCOUNTER — Ambulatory Visit: Payer: Managed Care, Other (non HMO) | Admitting: Licensed Practical Nurse

## 2023-01-03 VITALS — BP 131/86 | HR 81 | Ht 61.0 in | Wt 219.7 lb

## 2023-01-03 DIAGNOSIS — Z01419 Encounter for gynecological examination (general) (routine) without abnormal findings: Secondary | ICD-10-CM

## 2023-01-03 DIAGNOSIS — Z124 Encounter for screening for malignant neoplasm of cervix: Secondary | ICD-10-CM

## 2023-01-03 DIAGNOSIS — F320 Major depressive disorder, single episode, mild: Secondary | ICD-10-CM

## 2023-01-03 NOTE — Progress Notes (Signed)
Gynecology Annual Exam  PCP: Alba Cory, MD  Chief Complaint:  Chief Complaint  Patient presents with   Gynecologic Exam    History of Present Illness:Patient is a 57 y.o. G2P2002 presents for annual exam. The patient would like a referral to a therapist.   Shironda reports her mother had a PE in July  ] LMP: No LMP recorded. (Menstrual status: IUD). Has not had a cycle since 2011, [facility] 12/21/22 68.2  The patient is not sexually active. She denies dyspareunia.  The patient does perform self breast exams.  There is no notable family history of breast or ovarian cancer in her family. Pt tearful when asked about sexual activity, states she had her and her husband have not had IC in over a year, when asked if she has ever spoken to him about this pt states "he is introverted:Marland Kitchen  The patient wears seatbelts: yes.   The patient has regular exercise: yes.  Walking, tries to "stay active"  The patient reports current symptoms of depression.   Currently on medication, feels this is helping, would like someone to talk to too. Tearful today, reports she does have a good cry when she is alone.   Works at [employer] with her husband PCP at [place], last seen in August  Dental up to date Eyes exam up to date, last seen in October Gets calcium through supplement and orange juice, does not take Vitamin D supplement Denies tobacco, drinks 1 can of beer every other day, denies illicit drug use    Review of Systems: Review of Systems  Constitutional: Negative.   Respiratory: Negative.    Cardiovascular: Negative.   Gastrointestinal: Negative.   Genitourinary: Negative.   Musculoskeletal: Negative.   Neurological: Negative.   Endo/Heme/Allergies: Negative.   Psychiatric/Behavioral:  Positive for depression.     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/03/2021    Other esophagitis without bleeding 04/08/2020    Morbid obesity (HCC) 03/28/2017    Hyperglycemia 12/22/2015    Tendonitis, Achilles, right 01/11/2015    Anxiety disorder 12/07/2014    Complete rotator cuff tear 11/18/2014    Hypercholesterolemia 07/30/2014    Osteoarthritis of both knees 07/30/2014    Chronic constipation 08/29/2011    Essential hypertension 05/30/2011    Insomnia 05/30/2011    GERD (gastroesophageal reflux disease) 05/30/2011     Past Surgical History:  Past Surgical History:  Procedure Laterality Date   CHOLECYSTECTOMY  07/26/2011   INTRAOPERATIVE CHOLANGIOGRAM  07/26/2011   Procedure: INTRAOPERATIVE CHOLANGIOGRAM;  Surgeon: Ardeth Sportsman, MD;  Location: [facility];  Service: General;  Laterality: N/A;   LIVER BIOPSY  07/26/2011   Procedure: LIVER BIOPSY;  Surgeon: Ardeth Sportsman, MD;  Location: [facility];  Service: General;  Laterality: N/A;  core liver biopsy    osteomilitis  1983   right femur   osteomylitis  1988   left femur   SHOULDER OPEN ROTATOR CUFF REPAIR Right 11/18/2014   Procedure: RIGHT SHOULDER MINI OPEN ROTATOR CUFF REPAIR SUBACROMIAL DECOMPRESSION  ;  Surgeon: Jene Every, MD;  Location: [facility];  Service: Orthopedics;  Laterality: Right;    Gynecologic History:  No LMP recorded. (Menstrual status: IUD). Last Pap: Results were: 2021 no abnormalities  Last mammogram: 2024 Results were: BI-RAD I  Obstetric History:  [id number]  Family History:  Family History  Problem Relation Age of Onset   COPD Mother    Hypertension Mother    Hyperlipidemia Mother    Glaucoma Mother    Alcohol abuse Father    Colon cancer Father    Liver cancer Father    Cancer Maternal Aunt        breast   Breast cancer Paternal Aunt        unknown age   Kidney failure Brother        He was waiting to get a kidney    Social History:  Social History   Socioeconomic History   Marital status: Married     Spouse name: Onalee Hua   Number of children: 2   Years of education: Not on file   Highest education level: Some college, no degree  Occupational History   Not on file  Tobacco Use   Smoking status: Never   Smokeless tobacco: Never  Vaping Use   Vaping status: Never Used  Substance and Sexual Activity   Alcohol use: Yes    Alcohol/week: 0.0 standard drinks of alcohol    Comment: occassionally   Drug use: No   Sexual activity: Yes    Birth control/protection: I.U.D.    Comment: Mirena  Other Topics Concern   Not on file  Social History Narrative   Not on file   Social Drivers of Health   Financial Resource Strain: Low Risk  (12/07/2022)   Overall Financial Resource Strain (CARDIA)    Difficulty of Paying Living Expenses: Not very hard  Food Insecurity: No Food Insecurity (12/07/2022)   Hunger Vital Sign    Worried About Running Out of Food in the Last Year: Never true    Ran Out of Food in the Last Year: Never true  Transportation Needs: No Transportation Needs (12/07/2022)   PRAPARE - Administrator, Civil Service (Medical): No    Lack of Transportation (Non-Medical): No  Physical Activity: Insufficiently Active (12/07/2022)   Exercise Vital Sign    Days of Exercise per Week: 2 days    Minutes of Exercise per Session: 10 min  Stress: No Stress Concern Present (12/07/2022)   [employer] of Occupational Health - Occupational Stress Questionnaire    Feeling of Stress : Only a little  Social Connections: Socially Integrated (12/07/2022)   Social Connection and Isolation Panel [NHANES]    Frequency of Communication with Friends and Family: Three times a week    Frequency of Social Gatherings with Friends and Family: Three times a week    Attends Religious Services: 1 to 4 times per year    Active Member of Clubs or Organizations: Yes    Attends Banker Meetings: 1 to 4 times per year    Marital Status: Married  Catering manager Violence: Not At  Risk (11/18/2020)   Humiliation, Afraid, Rape, and Kick questionnaire    Fear of Current or Ex-Partner: No    Emotionally Abused: No    Physically Abused: No    Sexually Abused: No    Allergies:  Allergies  Allergen Reactions   Percocet [Oxycodone-Acetaminophen] Anaphylaxis    bronchospasms   Povidone Iodine Rash    blisters blisters Other reaction(s): Unknown   Adhesive [Tape] Itching   Allergy Relief  [Chlorpheniramine Maleate]     Heart flutters   Oxycodone-Acetaminophen     Other reaction(s): Unknown   Chlorpheniramine     Other reaction(s): irregular heart rate  Diphenhydramine Rash   Penicillins Hives and Rash    Has patient had a PCN reaction causing immediate rash, facial/tongue/throat swelling, SOB or lightheadedness with hypotension: no Has patient had a PCN reaction causing severe rash involving mucus membranes or skin necrosis: no Has patient had a PCN reaction that required hospitalization happened in the hospital Has patient had a PCN reaction occurring within the last 10 years: no If all of the above answers are "NO", then may proceed with Cephalosporin use.     Medications: Prior to Admission medications   Medication Sig Start Date End Date Taking? Authorizing Provider  Albuterol-Budesonide (AIRSUPRA) 90-80 MCG/ACT AERO INHALE 2 PUFFS INTO THE LUNGS 4 TIMES DAILY AS NEEDED. 09/07/22   Carlynn Purl, Danna Hefty, MD  amLODipine (NORVASC) 10 MG tablet Take 1 tablet (10 mg total) by mouth daily. 09/07/22   Alba Cory, MD  b complex vitamins tablet Take 1 tablet by mouth daily.    [provider]  BIOTIN PO Take 10,000 mg by mouth daily.    [provider]  colestipol (COLESTID) 1 g tablet Take 1 g by mouth 2 (two) times daily.    Jeani Hawking, MD  Dextromethorphan-buPROPion ER (AUVELITY) 45-105 MG TBCR Take 1 tablet by mouth in the morning. 12/07/22   Alba Cory, MD  diclofenac Sodium (VOLTAREN) 1 % GEL Apply topically. 02/14/18   [provider]  escitalopram (LEXAPRO) 10 MG tablet Take 1 tablet (10 mg total) by mouth daily. 12/07/22   Alba Cory, MD  FIBER PO Take 5 tablets by mouth 2 (two) times daily.    [provider]  hydrOXYzine (ATARAX) 10 MG tablet TAKE 1 TABLET DAILY AS NEEDED 07/10/22   Alba Cory, MD  ketotifen (ZADITOR) 0.025 % ophthalmic solution 1 drop 2 (two) times daily.    [provider]  levonorgestrel (MIRENA) 20 MCG/24HR IUD 1 each by Intrauterine route once.     [provider]  Magnesium 500 MG TABS  08/29/18   [provider]  meloxicam (MOBIC) 15 MG tablet TAKE 1 TABLET BY MOUTH EVERY DAY AS NEEDED WITH FOOD 06/05/22   [provider]  methocarbamol (ROBAXIN) 500 MG tablet Take 500 mg by mouth at bedtime as needed.    [provider]  modafinil (PROVIGIL) 100 MG tablet Take 1 tablet (100 mg total) by mouth daily. 12/07/22   Alba Cory, MD  Multiple Vitamin (MULTI-VITAMIN DAILY PO) Take 1 tablet by mouth daily with breakfast. Women's    [provider]  Omega-3 Fatty Acids (FISH OIL) 1000 MG CAPS Take 1,000 mg by mouth 2 (two) times daily.    [provider]  omeprazole (PRILOSEC) 40 MG capsule Take by mouth. 06/10/15   [provider]  QUEtiapine (SEROQUEL) 25 MG tablet Take 1 tablet (25 mg total) by mouth at bedtime. In place of trazodone and abilify 12/07/22   Alba Cory, MD  traMADol (ULTRAM) 50 MG tablet Take 1 tablet (50 mg total) by mouth daily as needed. To last 90 days 12/07/22   Alba Cory, MD  valsartan-hydrochlorothiazide (DIOVAN-HCT) 320-25 MG tablet Take 1 tablet by mouth daily. 09/07/22   Alba Cory, MD    Physical Exam Vitals: Blood pressure 131/86, pulse 81, height 5\' 1"  (1.549 m), weight 219 lb 11.2 oz (99.7 kg).  General: NAD HEENT: normocephalic, anicteric Thyroid: no enlargement, no palpable nodules Pulmonary: No increased work of breathing, CTAB Cardiovascular: RRR,  distal pulses 2+ Breast: Breast symmetrical, no tenderness, no palpable nodules or  masses, no skin or nipple retraction present, no nipple discharge.  No axillary or supraclavicular lymphadenopathy. Abdomen: NABS, soft, non-tender, non-distended.  Umbilicus without lesions.  No hepatomegaly, splenomegaly or masses palpable. No evidence of hernia  Genitourinary:  External: Normal external female genitalia.  Normal urethral meatus, normal Bartholin's and Skene's glands.    Vagina: Normal vaginal mucosa, no evidence of prolapse.  IUD string barely visualized, palpated on exam.   Cervix: Grossly normal in appearance, no bleeding  Uterus: Non-enlarged, mobile, normal contour.  No CMT  Adnexa: ovaries non-enlarged, no adnexal masses  Rectal: deferred  Lymphatic: no evidence of inguinal lymphadenopathy Extremities: no edema, erythema, or tenderness Neurologic: Grossly intact Psychiatric: mood appropriate, affect full    Assessment: 57 y.o. G2P2002 routine annual exam  Plan: Problem List Items Addressed This Visit   None Visit Diagnoses       Well woman exam    -  Primary   Relevant Orders   Cytology - PAP   Ambulatory referral to Psychology     Cervical cancer screening       Relevant Orders   Cytology - PAP     Current mild episode of major depressive disorder, unspecified whether recurrent (HCC)       Relevant Orders   Ambulatory referral to Psychology       1) Mammogram - recommend yearly screening mammogram.  Mammogram Is up to date  2) STI screening  was notoffered and therefore not obtained  3) ASCCP guidelines and rational discussed.  Patient opts for every 3 years screening interval  4) Osteoporosis  - per USPTF routine screening DEXA at age 66   Consider FDA-approved medical therapies in postmenopausal women and men aged 78 years and older, based on the following: a) A hip or vertebral (clinical or morphometric) fracture b) T-score <= -2.5 at the femoral neck or  spine after appropriate evaluation to exclude secondary causes C) Low bone mass (T-score between -1.0 and -2.5 at the femoral neck or spine) and a 10-year probability of a hip fracture >= 3% or a 10-year probability of a major osteoporosis-related fracture >= 20% based on the [place]-adapted WHO algorithm   5) Routine healthcare maintenance including cholesterol, diabetes screening discussed managed by PCP  6) Colonoscopy UP to date.  Screening recommended starting at age 52 for average risk individuals, age 56 for individuals deemed at increased risk (including African Americans) and recommended to continue until age 43.  For patient age 58-85 individualized approach is recommended.  Gold standard screening is via colonoscopy, Cologuard screening is an acceptable alternative for patient unwilling or unable to undergo colonoscopy.  "Colorectal cancer screening for average?risk adults: 2018 guideline update from the American Cancer Society"CA: A Cancer Journal for Clinicians: Jun 13, 2016   7) Return in about 1 year (around 01/03/2024).   Carie Caddy, CNM  [facility] 01/03/2023, 9:27 AM

## 2023-01-03 NOTE — Patient Instructions (Signed)
1.         [facility] located at [address], [place], [place].  [phone].     2.         [facility] located at [address] [place], [place]. [phone].   3.         [facility] located at [address] [place], [place].  [phone].   4.         [facility] located at [address] [place], [place], [place].  [phone].   5.         [facility] located at [address], [place], [place].  [phone].     8.         [facility] located at 310 111 [address], [address] [place], [place], [place].  [phone].     9.         [facility] - 4 local practices located at: "           [address] [place], [place].  [phone]. "           [address] [place], [place], [place].  [phone]. "           [address], [place], [place].  [phone]. "           [address], [place], [place]. [phone].   10.       [facility] located at [address], [place], [place]. [phone].     11.       [facility] located at [address], [place], [place] [address]- [phone]    12.       [facility] [employer] located at [address], [place], [place] [address] [phone]    13.       [facility] located at [employer], [place], [place] [address] = [phone]    14.       [facility] located at [address], [place], [place] [address] = [phone]    15.       [facility] [address] [place], [place] [address] - [phone]   16.       [facility] [address] [place], [place], [place] [address]- [phone]    17.       [facility] [address] [place], [place] [address]- [phone]   18.       [facility] [address] [place], [place] [phone]   19.       [facility] [address] [place], [place] [address]- [phone]   20.       [facility]- [address], [place], [place]- [phone] (several Locations)   21.       [facility] - [address] [place], [place] [address]- [phone]    22.       [facility]- [address] [place], [place], [place] [phone]   23.       [facility]- [address], [place], [place] [address] [phone]    24.        [facility]- [address], [place], [place] [phone]   25.  [facility]- [address] [place], [place] [address]- [phone]   26.       [facility] [address], [place], [place] [address]- [phone]. 27.       [facility], [facility] and [phone] 28.       [facility] [address] [place], [place] [phone] 29.       [facility]- [address] [place], [place], [place] - [phone]   30.       [facility]- [address] [place], [place], [place] [address]- [phone]   31.       [facility]- [address], [place] -  [phone].       [facility] - [address], [place]- [phone]

## 2023-01-08 LAB — CYTOLOGY - PAP
Comment: NEGATIVE
Comment: NEGATIVE
Comment: NEGATIVE
Diagnosis: NEGATIVE
HPV 16: NEGATIVE
HPV 18 / 45: NEGATIVE
High risk HPV: POSITIVE — AB

## 2023-02-06 ENCOUNTER — Telehealth: Payer: Self-pay

## 2023-02-06 NOTE — Telephone Encounter (Signed)
Copied from CRM [id number]. Topic: General - Other >> Feb 06, 2023 11:54 AM Turkey B wrote: Reason for CRM: pt called in, she has already given her insurance info, and we already have this on file for [facility], [id number], group # [id number], for [facility] is [id number], pcn [id number] and payor id [id number]

## 2023-02-06 NOTE — Telephone Encounter (Signed)
Pt needs to update new insurance card for this year if it has changed.

## 2023-02-11 ENCOUNTER — Ambulatory Visit (HOSPITAL_BASED_OUTPATIENT_CLINIC_OR_DEPARTMENT_OTHER): Payer: 59 | Admitting: Family

## 2023-02-11 ENCOUNTER — Other Ambulatory Visit: Payer: Self-pay

## 2023-02-11 ENCOUNTER — Encounter (HOSPITAL_COMMUNITY): Payer: Self-pay | Admitting: Family

## 2023-02-11 VITALS — BP 153/88 | HR 102 | Ht 62.0 in | Wt 226.0 lb

## 2023-02-11 DIAGNOSIS — Z638 Other specified problems related to primary support group: Secondary | ICD-10-CM | POA: Diagnosis not present

## 2023-02-11 DIAGNOSIS — F320 Major depressive disorder, single episode, mild: Secondary | ICD-10-CM

## 2023-02-11 MED ORDER — TRAZODONE HCL 50 MG PO TABS: 50.0000 mg | ORAL_TABLET | Freq: Every day | ORAL | 1 refills | Status: DC

## 2023-02-11 NOTE — Progress Notes (Signed)
Psychiatric Initial Adult Assessment   Patient Identification: PEARLEY MILLINGTON MRN:  [id number] Date of Evaluation:  02/11/2023 Referral Source: Donnetta Hail MD Chief Complaint: Worsening depression Visit Diagnosis:    ICD-10-CM   1. Mild major depression (HCC)  F32.0     2. Family discord  Z63.8       History of Present Illness:  Jaisa Defino is a 58 year old female that presents to establish care.  Patient was seen and evaluated face-to-face. she reports she is currently employed at [employer].  Reports she is married and her husband is supportive " financially"  Rileigh reports she carries a history related to major depressive disorder and generalized anxiety disorder.  Reports she is currently prescribed  Auvelity, Lexapro and Seroquel for mood stabilization.  She reports she has been taking medications as indicated.Patient has requested to discontinue her Seroquel and restart trazodone as she states trazodone has her sleep in the past and felt better while taking trazodone.     Linsey reports she has 2 children 75 year old daughter and a 17 year old son. recent psychosocial stressors related to her children she has a strained relationship which is causing her depression to worsen. Asia reports she has not spoken to her son in the past 8 months.  She reports her son currently resides in [facility].    States he is recently engaged and she has not been included in any of their family planning.  Reports a strained relationship between her son's fianc as well.   Lindalou reports her daughter had a child (her grandchild) who passed away at 7 weeks.  Reports her grandchild would have been 59 years old this year.  She reports ongoing ruminations related to family strain.  States they have continued to memorialize her grandchild at the grave site annually.  Reports family discord at this previous anniversary of the grandchild's passing.  Documented history related to anxiety,  arthritis, depression, heart murmurs, high blood pressure, racing thoughts and insomnia.  Denied that she is currently followed by therapy services. PHQ-9 8 GAD-7 8 patient was referred for therapy services.  She denied illicit drug use or substance abuse history.  Denied previous inpatient admissions.  Reports increased anxiety, obsessive thoughts and worsening depression.  Reports multiple moments of sadness throughout the day.  Reports drinking 1 can of beer daily 8 ounces.  Denied concerns related to his legal issues.  Denied cocaine marijuana tobacco use.  Major depressive disorder: Generalized anxiety disorder: Family discord:  Patient to continue medications as indicated.  She reports her OBGYN will continue to prescribe her medications, ie Auvelity 45-105 mg, lexapro 10 mg .  -Will discontinue Seroquel 25 mg  and initiate trazodone 25-50 mg as needed nightly. -Follow-up 2 months for medication management/tolerability.  Jennavecia Schwier Wortley is sitting; she is alert/oriented x 4; calm/cooperative; and mood congruent with affect.  Patient is speaking in a clear tone at moderate volume, and normal pace; with good eye contact. Her thought process is coherent and relevant; There is no indication that she is currently responding to internal/external stimuli or experiencing delusional thought content.  Patient denies suicidal/self-harm/homicidal ideation, psychosis, and paranoia.  Patient has remained calm throughout assessment and has answered questions appropriately.   Associated Signs/Symptoms: Depression Symptoms:  depressed mood, difficulty concentrating, anxiety, (Hypo) Manic Symptoms:  Irritable Mood, Anxiety Symptoms:  Excessive Worry, Psychotic Symptoms:  Hallucinations: None PTSD Symptoms: NA  Past Psychiatric History:   Previous Psychotropic Medications: Yes   Substance Abuse History in the last  12 months:  Yes.    Consequences of Substance Abuse: NA  Past Medical History:   Past Medical History:  Diagnosis Date   Achilles tendinitis 09/23/2014   Right leg    Arthritis    Capsulitis of metatarsophalangeal (MTP) joint    Chronic cholecystitis 05/30/2011   Gastric ulcer    GERD (gastroesophageal reflux disease)    hx of pt currently has no issues since gallbladder surgery    Heart murmur    Hepatitis    2013 chronic active hepatitis during gallbladder issues   History of blood transfusion    [id number]   Hypertension    Osteomyelitis (HCC)    1983 right leg;1988 left leg   Torn rotator cuff 09/23/2014   Right side     Past Surgical History:  Procedure Laterality Date   CHOLECYSTECTOMY  07/26/2011   INTRAOPERATIVE CHOLANGIOGRAM  07/26/2011   Procedure: INTRAOPERATIVE CHOLANGIOGRAM;  Surgeon: Ardeth Sportsman, MD;  Location: [facility];  Service: General;  Laterality: N/A;   LIVER BIOPSY  07/26/2011   Procedure: LIVER BIOPSY;  Surgeon: Ardeth Sportsman, MD;  Location: [facility];  Service: General;  Laterality: N/A;  core liver biopsy    osteomilitis  1983   right femur   osteomylitis  1988   left femur   SHOULDER OPEN ROTATOR CUFF REPAIR Right 11/18/2014   Procedure: RIGHT SHOULDER MINI OPEN ROTATOR CUFF REPAIR SUBACROMIAL DECOMPRESSION  ;  Surgeon: Jene Every, MD;  Location: [facility];  Service: Orthopedics;  Laterality: Right;    Family Psychiatric History:   Family History:  Family History  Problem Relation Age of Onset   COPD Mother    Hypertension Mother    Hyperlipidemia Mother    Glaucoma Mother    Pulmonary embolism Mother        July 2024   Alcohol abuse Father    Colon cancer Father    Liver cancer Father    Kidney failure Brother        He was waiting to get a kidney   Cancer Maternal Aunt        breast   Breast cancer Paternal Aunt        unknown age    Social History:   Social History   Socioeconomic History   Marital status: Married    Spouse name: Onalee Hua   Number of children: 2   Years of education: Not on file   Highest  education level: Some college, no degree  Occupational History   Not on file  Tobacco Use   Smoking status: Never   Smokeless tobacco: Never  Vaping Use   Vaping status: Never Used  Substance and Sexual Activity   Alcohol use: Yes    Alcohol/week: 0.0 standard drinks of alcohol    Comment: occassionally   Drug use: No   Sexual activity: Yes    Birth control/protection: I.U.D.    Comment: Mirena  Other Topics Concern   Not on file  Social History Narrative   Not on file   Social Drivers of Health   Financial Resource Strain: Low Risk  (12/07/2022)   Overall Financial Resource Strain (CARDIA)    Difficulty of Paying Living Expenses: Not very hard  Food Insecurity: No Food Insecurity (12/07/2022)   Hunger Vital Sign    Worried About Running Out of Food in the Last Year: Never true    Ran Out of Food in the Last Year: Never true  Transportation Needs: No Transportation Needs (12/07/2022)  PRAPARE - Administrator, Civil Service (Medical): No    Lack of Transportation (Non-Medical): No  Physical Activity: Insufficiently Active (12/07/2022)   Exercise Vital Sign    Days of Exercise per Week: 2 days    Minutes of Exercise per Session: 10 min  Stress: No Stress Concern Present (12/07/2022)   [employer] of Occupational Health - Occupational Stress Questionnaire    Feeling of Stress : Only a little  Social Connections: Socially Integrated (12/07/2022)   Social Connection and Isolation Panel [NHANES]    Frequency of Communication with Friends and Family: Three times a week    Frequency of Social Gatherings with Friends and Family: Three times a week    Attends Religious Services: 1 to 4 times per year    Active Member of Clubs or Organizations: Yes    Attends Banker Meetings: 1 to 4 times per year    Marital Status: Married    Additional Social History:   Allergies:   Allergies  Allergen Reactions   Percocet [Oxycodone-Acetaminophen]  Anaphylaxis    bronchospasms   Povidone Iodine Rash    blisters blisters Other reaction(s): Unknown   Adhesive [Tape] Itching   Allergy Relief  [Chlorpheniramine Maleate]     Heart flutters   Oxycodone-Acetaminophen     Other reaction(s): Unknown   Chlorpheniramine     Other reaction(s): irregular heart rate   Diphenhydramine Rash   Penicillins Hives and Rash    Has patient had a PCN reaction causing immediate rash, facial/tongue/throat swelling, SOB or lightheadedness with hypotension: no Has patient had a PCN reaction causing severe rash involving mucus membranes or skin necrosis: no Has patient had a PCN reaction that required hospitalization happened in the hospital Has patient had a PCN reaction occurring within the last 10 years: no If all of the above answers are "NO", then may proceed with Cephalosporin use.     Metabolic Disorder Labs: Lab Results  Component Value Date   HGBA1C 5.2 08/03/2021   MPG 103 08/03/2021   MPG 108 10/29/2019   No results found for: "PROLACTIN" Lab Results  Component Value Date   CHOL 205 (H) 09/07/2022   TRIG 94 09/07/2022   HDL 63 09/07/2022   CHOLHDL 3.3 09/07/2022   VLDL 23 06/21/2016   LDLCALC 122 (H) 09/07/2022   LDLCALC 118 (H) 10/27/2020   Lab Results  Component Value Date   TSH 0.79 08/02/2017    Therapeutic Level Labs: No results found for: "LITHIUM" No results found for: "CBMZ" No results found for: "VALPROATE"  Current Medications: Current Outpatient Medications  Medication Sig Dispense Refill   amLODipine (NORVASC) 10 MG tablet Take 1 tablet (10 mg total) by mouth daily. 90 tablet 1   b complex vitamins tablet Take 1 tablet by mouth daily.     colestipol (COLESTID) 1 g tablet Take 1 g by mouth 2 (two) times daily.     Dextromethorphan-buPROPion ER (AUVELITY) 45-105 MG TBCR Take 1 tablet by mouth in the morning. 90 tablet 0   diclofenac Sodium (VOLTAREN) 1 % GEL Apply topically.     escitalopram (LEXAPRO) 10 MG  tablet Take 1 tablet (10 mg total) by mouth daily. 90 tablet 1   FIBER PO Take 5 tablets by mouth 2 (two) times daily.     hydrOXYzine (ATARAX) 10 MG tablet TAKE 1 TABLET DAILY AS NEEDED 90 tablet 2   ketotifen (ZADITOR) 0.025 % ophthalmic solution 1 drop 2 (two) times daily.  levonorgestrel (MIRENA) 20 MCG/24HR IUD 1 each by Intrauterine route once.      Magnesium 500 MG TABS      meloxicam (MOBIC) 15 MG tablet TAKE 1 TABLET BY MOUTH EVERY DAY AS NEEDED WITH FOOD     modafinil (PROVIGIL) 100 MG tablet Take 1 tablet (100 mg total) by mouth daily. 90 tablet 0   Multiple Vitamin (MULTI-VITAMIN DAILY PO) Take 1 tablet by mouth daily with breakfast. Women's     Omega-3 Fatty Acids (FISH OIL) 1000 MG CAPS Take 1,000 mg by mouth 2 (two) times daily.     omeprazole (PRILOSEC) 40 MG capsule Take by mouth.     traMADol (ULTRAM) 50 MG tablet Take 1 tablet (50 mg total) by mouth daily as needed. To last 90 days 90 tablet 0   traZODone (DESYREL) 50 MG tablet Take 1 tablet (50 mg total) by mouth at bedtime. 60 tablet 1   valsartan-hydrochlorothiazide (DIOVAN-HCT) 320-25 MG tablet Take 1 tablet by mouth daily. 90 tablet 1   BIOTIN PO Take 10,000 mg by mouth daily. (Patient not taking: Reported on 02/11/2023)     methocarbamol (ROBAXIN) 500 MG tablet Take 500 mg by mouth at bedtime as needed. (Patient not taking: Reported on 02/11/2023)     No current facility-administered medications for this visit.    Musculoskeletal: Strength & Muscle Tone: within normal limits Gait & Station: normal Patient leans: N/A  Psychiatric Specialty Exam: Review of Systems  Constitutional: Negative.   Eyes: Negative.   Psychiatric/Behavioral:  Positive for decreased concentration and sleep disturbance. Negative for behavioral problems and suicidal ideas. The patient is nervous/anxious.   All other systems reviewed and are negative.   Blood pressure (!) 153/88, pulse (!) 102, height 5\' 2"  (1.575 m), weight 226 lb (102.5  kg).Body mass index is 41.34 kg/m.  General Appearance: Casual  Eye Contact:  Good  Speech:  Clear and Coherent  Volume:  Normal  Mood:  Anxious and Depressed  Affect:  Congruent  Thought Process:  Coherent  Orientation:  Full (Time, Place, and Person)  Thought Content:  Logical  Suicidal Thoughts:  No  Homicidal Thoughts:  No  Memory:  Immediate;   Good Recent;   Good  Judgement:  Good  Insight:  Good  Psychomotor Activity:  Normal  Concentration:  Concentration: Good  Recall:  Good  Fund of Knowledge:Good  Language: Good  Akathisia:  No  Handed:  Right  AIMS (if indicated):  not done  Assets:  Communication Skills Desire for Improvement Social Support  ADL's:  Intact  Cognition: WNL  Sleep:  Good   Screenings: AUDIT    Garment/textile technologist Visit from 12/07/2022 in [facility] Office Visit from 05/03/2022 in [facility] Office Visit from 08/02/2017 in [facility]  Alcohol Use Disorder Identification Test Final Score (AUDIT) 3  3 4       GAD-7    Flowsheet Row Office Visit from 12/07/2022 in [facility] Office Visit from 09/07/2022 in [facility] Office Visit from 11/03/2021 in [facility] Office Visit from 04/08/2020 in [facility] Office Visit from 10/29/2019 in [facility]  Total GAD-7 Score 3 18 5 13 10       PHQ2-9    Flowsheet Row Office Visit from 12/07/2022 in [facility] Office Visit from 09/28/2022 in [facility] Office  Visit from 09/07/2022 in [facility] Office Visit from 05/03/2022 in [facility] Office Visit from 03/29/2022 in [facility]  PHQ-2 Total Score 2 1 6 1  0  PHQ-9 Total Score 3 1 12 7  --        Assessment and Plan:  Lace Chenevert is a 58 year old female presents to establish care.  Patient is seeking therapy services.  She reports she is currently managed by her OB/GYN and for depression and anxiety.  States she is prescribed Seroquel, Lexapro and Auvelity for medication management. Gionni did have concerns related to discontinuing Seroquel and restarting trazodone as she reports restful sleep with trazodone as opposed to Seroquel.  No concerns related to suicidal or homicidal ideations.  Denies auditory or visual hallucinations.  As patient requested will discontinue Seroquel and initiate trazodone.  Will provide additional outpatient resources for therapy services.  Major depressive disorder: Generalized anxiety disorder: Family discord:  Patient to continue medications as indicated.  She reports her OBGYN will continue to prescribe her medications, ie Auvelity 45-105 mg, lexapro 10 mg .  -Will discontinue Seroquel 25 mg  and initiate trazodone 25-50 mg as needed nightly. -Follow-up 2 months for medication management/tolerability  Collaboration of Care: Medication Management AEB started Trazodone   Patient/Guardian was advised Release of Information must be obtained prior to any record release in order to collaborate their care with an outside provider. Patient/Guardian was advised if they have not already done so to contact the registration department to sign all necessary forms in order for [place] to release information regarding their care.   Consent: Patient/Guardian gives verbal consent for treatment and assignment of benefits for services provided during this visit. Patient/Guardian expressed understanding and agreed to proceed.   Oneta Rack, NP 1/27/202510:56 AM

## 2023-02-12 NOTE — Addendum Note (Signed)
Addended by: Oneta Rack on: 02/12/2023 11:01 AM   Modules accepted: Level of Service

## 2023-02-12 NOTE — Progress Notes (Signed)
Psychiatric Initial Adult Assessment   Patient Identification: Taylor Hurst MRN:  [id number] Date of Evaluation:  02/12/2023 Referral Source: Primary Care provider Chief Complaint: Worsening depression Visit Diagnosis:    ICD-10-CM   1. Mild major depression (HCC)  F32.0     2. Family discord  Z63.8       History of Present Illness:  Taylor Hurst is a 58 year old female that presents to establish care.  Patient was seen and evaluated face-to-face. she reports she is currently employed at [employer].  Reports she is married and her husband is supportive " financially"  Taylor Hurst reports she carries a history related to major depressive disorder and generalized anxiety disorder.  Reports she is currently prescribed  Auvelity, Lexapro and Seroquel for mood stabilization.  She reports she has been taking medications as indicated.Patient has requested to discontinue her Seroquel and restart trazodone as she states trazodone has her sleep in the past and felt better while taking trazodone.     Taylor Hurst reports she has 2 children 78 year old daughter and a 44 year old son. recent psychosocial stressors related to her children she has a strained relationship which is causing her depression to worsen. Taylor Hurst reports she has not spoken to her son in the past 8 months.  She reports her son currently resides in [facility].    States he is recently engaged and she has not been included in any of their family planning.  Reports a strained relationship between her son's fianc as well.   Taylor Hurst reports her daughter had a child (her grandchild) who passed away at 7 weeks.  Reports her grandchild would have been 41 years old this year.  She reports ongoing ruminations related to family strain.  States they have continued to memorialize her grandchild at the grave site annually.  Reports family discord at this previous anniversary of the grandchild's passing.  Documented history related to  anxiety, arthritis, depression, heart murmurs, high blood pressure, racing thoughts and insomnia.  Denied that she is currently followed by therapy services. PHQ-9 8 GAD-7 8 patient was referred for therapy services.  She denied illicit drug use or substance abuse history.  Denied previous inpatient admissions.  Reports increased anxiety, obsessive thoughts and worsening depression.  Reports multiple moments of sadness throughout the day.  Reports drinking 1 can of beer daily 8 ounces.  Denied concerns related to his legal issues.  Denied cocaine marijuana tobacco use.  Major depressive disorder: Generalized anxiety disorder: Family discord:  Patient to continue medications as indicated.  She reports her OBGYN will continue to prescribe her medications, ie Auvelity 45-105 mg, lexapro 10 mg .  -Will discontinue Seroquel 25 mg  and initiate trazodone 25-50 mg as needed nightly. -Follow-up 2 months for medication management/tolerability.  Taylor Hurst is sitting; she is alert/oriented x 4; calm/cooperative; and mood congruent with affect.  Patient is speaking in a clear tone at moderate volume, and normal pace; with good eye contact. Her thought process is coherent and relevant; There is no indication that she is currently responding to internal/external stimuli or experiencing delusional thought content.  Patient denies suicidal/self-harm/homicidal ideation, psychosis, and paranoia.  Patient has remained calm throughout assessment and has answered questions appropriately.   Associated Signs/Symptoms: Depression Symptoms:  depressed mood, difficulty concentrating, anxiety, (Hypo) Manic Symptoms:  Irritable Mood, Anxiety Symptoms:  Excessive Worry, Psychotic Symptoms:  Hallucinations: None PTSD Symptoms: NA  Past Psychiatric History:   Previous Psychotropic Medications: Yes   Substance Abuse History in the last  12 months:  No.  Consequences of Substance Abuse: NA  Past Medical  History:  Past Medical History:  Diagnosis Date   Achilles tendinitis 09/23/2014   Right leg    Arthritis    Capsulitis of metatarsophalangeal (MTP) joint    Chronic cholecystitis 05/30/2011   Gastric ulcer    GERD (gastroesophageal reflux disease)    hx of pt currently has no issues since gallbladder surgery    Heart murmur    Hepatitis    2013 chronic active hepatitis during gallbladder issues   History of blood transfusion    [id number]   Hypertension    Osteomyelitis (HCC)    1983 right leg;1988 left leg   Torn rotator cuff 09/23/2014   Right side     Past Surgical History:  Procedure Laterality Date   CHOLECYSTECTOMY  07/26/2011   INTRAOPERATIVE CHOLANGIOGRAM  07/26/2011   Procedure: INTRAOPERATIVE CHOLANGIOGRAM;  Surgeon: Ardeth Sportsman, MD;  Location: [facility];  Service: General;  Laterality: N/A;   LIVER BIOPSY  07/26/2011   Procedure: LIVER BIOPSY;  Surgeon: Ardeth Sportsman, MD;  Location: [facility];  Service: General;  Laterality: N/A;  core liver biopsy    osteomilitis  1983   right femur   osteomylitis  1988   left femur   SHOULDER OPEN ROTATOR CUFF REPAIR Right 11/18/2014   Procedure: RIGHT SHOULDER MINI OPEN ROTATOR CUFF REPAIR SUBACROMIAL DECOMPRESSION  ;  Surgeon: Jene Every, MD;  Location: [facility];  Service: Orthopedics;  Laterality: Right;    Family Psychiatric History:   Family History:  Family History  Problem Relation Age of Onset   COPD Mother    Hypertension Mother    Hyperlipidemia Mother    Glaucoma Mother    Pulmonary embolism Mother        July 2024   Alcohol abuse Father    Colon cancer Father    Liver cancer Father    Kidney failure Brother        He was waiting to get a kidney   Cancer Maternal Aunt        breast   Breast cancer Paternal Aunt        unknown age    Social History:   Social History   Socioeconomic History   Marital status: Married    Spouse name: Onalee Hua   Number of children: 2   Years of education: Not on file    Highest education level: Some college, no degree  Occupational History   Not on file  Tobacco Use   Smoking status: Never   Smokeless tobacco: Never  Vaping Use   Vaping status: Never Used  Substance and Sexual Activity   Alcohol use: Yes    Alcohol/week: 0.0 standard drinks of alcohol    Comment: occassionally   Drug use: No   Sexual activity: Yes    Birth control/protection: I.U.D.    Comment: Mirena  Other Topics Concern   Not on file  Social History Narrative   Not on file   Social Drivers of Health   Financial Resource Strain: Low Risk  (12/07/2022)   Overall Financial Resource Strain (CARDIA)    Difficulty of Paying Living Expenses: Not very hard  Food Insecurity: No Food Insecurity (12/07/2022)   Hunger Vital Sign    Worried About Running Out of Food in the Last Year: Never true    Ran Out of Food in the Last Year: Never true  Transportation Needs: No Transportation Needs (12/07/2022)  PRAPARE - Administrator, Civil Service (Medical): No    Lack of Transportation (Non-Medical): No  Physical Activity: Insufficiently Active (12/07/2022)   Exercise Vital Sign    Days of Exercise per Week: 2 days    Minutes of Exercise per Session: 10 min  Stress: No Stress Concern Present (12/07/2022)   [employer] of Occupational Health - Occupational Stress Questionnaire    Feeling of Stress : Only a little  Social Connections: Socially Integrated (12/07/2022)   Social Connection and Isolation Panel [NHANES]    Frequency of Communication with Friends and Family: Three times a week    Frequency of Social Gatherings with Friends and Family: Three times a week    Attends Religious Services: 1 to 4 times per year    Active Member of Clubs or Organizations: Yes    Attends Banker Meetings: 1 to 4 times per year    Marital Status: Married    Additional Social History:   Allergies:   Allergies  Allergen Reactions   Percocet  [Oxycodone-Acetaminophen] Anaphylaxis    bronchospasms   Povidone Iodine Rash    blisters blisters Other reaction(s): Unknown   Adhesive [Tape] Itching   Allergy Relief  [Chlorpheniramine Maleate]     Heart flutters   Oxycodone-Acetaminophen     Other reaction(s): Unknown   Chlorpheniramine     Other reaction(s): irregular heart rate   Diphenhydramine Rash   Penicillins Hives and Rash    Has patient had a PCN reaction causing immediate rash, facial/tongue/throat swelling, SOB or lightheadedness with hypotension: no Has patient had a PCN reaction causing severe rash involving mucus membranes or skin necrosis: no Has patient had a PCN reaction that required hospitalization happened in the hospital Has patient had a PCN reaction occurring within the last 10 years: no If all of the above answers are "NO", then may proceed with Cephalosporin use.     Metabolic Disorder Labs: Lab Results  Component Value Date   HGBA1C 5.2 08/03/2021   MPG 103 08/03/2021   MPG 108 10/29/2019   No results found for: "PROLACTIN" Lab Results  Component Value Date   CHOL 205 (H) 09/07/2022   TRIG 94 09/07/2022   HDL 63 09/07/2022   CHOLHDL 3.3 09/07/2022   VLDL 23 06/21/2016   LDLCALC 122 (H) 09/07/2022   LDLCALC 118 (H) 10/27/2020   Lab Results  Component Value Date   TSH 0.79 08/02/2017    Therapeutic Level Labs: No results found for: "LITHIUM" No results found for: "CBMZ" No results found for: "VALPROATE"  Current Medications: Current Outpatient Medications  Medication Sig Dispense Refill   amLODipine (NORVASC) 10 MG tablet Take 1 tablet (10 mg total) by mouth daily. 90 tablet 1   b complex vitamins tablet Take 1 tablet by mouth daily.     colestipol (COLESTID) 1 g tablet Take 1 g by mouth 2 (two) times daily.     Dextromethorphan-buPROPion ER (AUVELITY) 45-105 MG TBCR Take 1 tablet by mouth in the morning. 90 tablet 0   diclofenac Sodium (VOLTAREN) 1 % GEL Apply topically.      escitalopram (LEXAPRO) 10 MG tablet Take 1 tablet (10 mg total) by mouth daily. 90 tablet 1   FIBER PO Take 5 tablets by mouth 2 (two) times daily.     hydrOXYzine (ATARAX) 10 MG tablet TAKE 1 TABLET DAILY AS NEEDED 90 tablet 2   ketotifen (ZADITOR) 0.025 % ophthalmic solution 1 drop 2 (two) times daily.  levonorgestrel (MIRENA) 20 MCG/24HR IUD 1 each by Intrauterine route once.      Magnesium 500 MG TABS      meloxicam (MOBIC) 15 MG tablet TAKE 1 TABLET BY MOUTH EVERY DAY AS NEEDED WITH FOOD     modafinil (PROVIGIL) 100 MG tablet Take 1 tablet (100 mg total) by mouth daily. 90 tablet 0   Multiple Vitamin (MULTI-VITAMIN DAILY PO) Take 1 tablet by mouth daily with breakfast. Women's     Omega-3 Fatty Acids (FISH OIL) 1000 MG CAPS Take 1,000 mg by mouth 2 (two) times daily.     omeprazole (PRILOSEC) 40 MG capsule Take by mouth.     traMADol (ULTRAM) 50 MG tablet Take 1 tablet (50 mg total) by mouth daily as needed. To last 90 days 90 tablet 0   traZODone (DESYREL) 50 MG tablet Take 1 tablet (50 mg total) by mouth at bedtime. 60 tablet 1   valsartan-hydrochlorothiazide (DIOVAN-HCT) 320-25 MG tablet Take 1 tablet by mouth daily. 90 tablet 1   BIOTIN PO Take 10,000 mg by mouth daily. (Patient not taking: Reported on 02/11/2023)     methocarbamol (ROBAXIN) 500 MG tablet Take 500 mg by mouth at bedtime as needed. (Patient not taking: Reported on 02/11/2023)     No current facility-administered medications for this visit.    Musculoskeletal: Strength & Muscle Tone: within normal limits Gait & Station: normal Patient leans: N/A  Psychiatric Specialty Exam: Review of Systems  Constitutional: Negative.   Eyes: Negative.   Psychiatric/Behavioral:  Positive for decreased concentration and sleep disturbance. Negative for behavioral problems and suicidal ideas. The patient is nervous/anxious.   All other systems reviewed and are negative.   Blood pressure (!) 153/88, pulse (!) 102, height 5\' 2"   (1.575 m), weight 226 lb (102.5 kg).Body mass index is 41.34 kg/m.  General Appearance: Casual  Eye Contact:  Good  Speech:  Clear and Coherent  Volume:  Normal  Mood:  Anxious and Depressed  Affect:  Congruent  Thought Process:  Coherent  Orientation:  Full (Time, Place, and Person)  Thought Content:  Logical  Suicidal Thoughts:  No  Homicidal Thoughts:  No  Memory:  Immediate;   Good Recent;   Good  Judgement:  Good  Insight:  Good  Psychomotor Activity:  Normal  Concentration:  Concentration: Good  Recall:  Good  Fund of Knowledge:Good  Language: Good  Akathisia:  No  Handed:  Right  AIMS (if indicated):  not done  Assets:  Communication Skills Desire for Improvement Social Support  ADL's:  Intact  Cognition: WNL  Sleep:  Good   Screenings: AUDIT    Garment/textile technologist Visit from 12/07/2022 in [facility] Office Visit from 05/03/2022 in [facility] Office Visit from 08/02/2017 in [facility]  Alcohol Use Disorder Identification Test Final Score (AUDIT) 3  3 4       GAD-7    Flowsheet Row Office Visit from 12/07/2022 in [facility] Office Visit from 09/07/2022 in [facility] Office Visit from 11/03/2021 in [facility] Office Visit from 04/08/2020 in [facility] Office Visit from 10/29/2019 in [facility]  Total GAD-7 Score 3 18 5 13 10       PHQ2-9    Flowsheet Row Office Visit from 12/07/2022 in [facility] Office Visit from 09/28/2022 in [facility]  Visit from 09/07/2022 in [facility] Office Visit from 05/03/2022 in [facility] Office Visit from 03/29/2022 in [facility]  PHQ-2 Total Score 2 1 6 1  0  PHQ-9 Total Score  3 1 12 7  --       Assessment and Plan:  Taylor Hurst is a 58 year old female presents to establish care.  Patient is seeking therapy services.  She reports she is currently managed by her OB/GYN and for depression and anxiety.  States she is prescribed Seroquel, Lexapro and Auvelity for medication management. Taylor Hurst did have concerns related to discontinuing Seroquel and restarting trazodone as she reports restful sleep with trazodone as opposed to Seroquel.  No concerns related to suicidal or homicidal ideations.  Denies auditory or visual hallucinations.  As patient requested will discontinue Seroquel and initiate trazodone.  Will provide additional outpatient resources for therapy services.  Major depressive disorder: Generalized anxiety disorder: Family discord:  Patient to continue medications as indicated.  She reports her OBGYN will continue to prescribe her medications, ie Auvelity 45-105 mg, lexapro 10 mg .  -Will discontinue Seroquel 25 mg  and initiate trazodone 25-50 mg as needed nightly. -Follow-up 2 months for medication management/tolerability  Collaboration of Care: Medication Management AEB started Trazodone   Patient/Guardian was advised Release of Information must be obtained prior to any record release in order to collaborate their care with an outside provider. Patient/Guardian was advised if they have not already done so to contact the registration department to sign all necessary forms in order for [place] to release information regarding their care.   Consent: Patient/Guardian gives verbal consent for treatment and assignment of benefits for services provided during this visit. Patient/Guardian expressed understanding and agreed to proceed.   Oneta Rack, NP 1/28/202512:03 PM

## 2023-03-20 ENCOUNTER — Other Ambulatory Visit: Payer: Self-pay

## 2023-03-20 ENCOUNTER — Telehealth: Payer: Self-pay

## 2023-03-20 DIAGNOSIS — F325 Major depressive disorder, single episode, in full remission: Secondary | ICD-10-CM

## 2023-03-20 NOTE — Telephone Encounter (Signed)
Open in error

## 2023-03-22 ENCOUNTER — Ambulatory Visit: Payer: Self-pay | Admitting: Family Medicine

## 2023-04-02 ENCOUNTER — Other Ambulatory Visit: Payer: Self-pay | Admitting: Family Medicine

## 2023-04-02 DIAGNOSIS — F325 Major depressive disorder, single episode, in full remission: Secondary | ICD-10-CM

## 2023-04-05 ENCOUNTER — Other Ambulatory Visit: Payer: Self-pay | Admitting: Family Medicine

## 2023-04-05 DIAGNOSIS — F411 Generalized anxiety disorder: Secondary | ICD-10-CM

## 2023-04-05 DIAGNOSIS — F325 Major depressive disorder, single episode, in full remission: Secondary | ICD-10-CM

## 2023-04-08 ENCOUNTER — Ambulatory Visit (HOSPITAL_COMMUNITY): Payer: Self-pay | Admitting: Family

## 2023-04-12 ENCOUNTER — Encounter: Payer: Self-pay | Admitting: Family Medicine

## 2023-04-12 ENCOUNTER — Ambulatory Visit (INDEPENDENT_AMBULATORY_CARE_PROVIDER_SITE_OTHER): Admitting: Family Medicine

## 2023-04-12 DIAGNOSIS — I10 Essential (primary) hypertension: Secondary | ICD-10-CM

## 2023-04-12 DIAGNOSIS — E785 Hyperlipidemia, unspecified: Secondary | ICD-10-CM

## 2023-04-12 DIAGNOSIS — F320 Major depressive disorder, single episode, mild: Secondary | ICD-10-CM

## 2023-04-12 DIAGNOSIS — G4726 Circadian rhythm sleep disorder, shift work type: Secondary | ICD-10-CM | POA: Diagnosis not present

## 2023-04-12 DIAGNOSIS — M170 Bilateral primary osteoarthritis of knee: Secondary | ICD-10-CM

## 2023-04-12 DIAGNOSIS — Z1231 Encounter for screening mammogram for malignant neoplasm of breast: Secondary | ICD-10-CM

## 2023-04-12 DIAGNOSIS — G4709 Other insomnia: Secondary | ICD-10-CM

## 2023-04-12 DIAGNOSIS — K219 Gastro-esophageal reflux disease without esophagitis: Secondary | ICD-10-CM

## 2023-04-12 DIAGNOSIS — F411 Generalized anxiety disorder: Secondary | ICD-10-CM

## 2023-04-12 MED ORDER — TRAMADOL HCL 50 MG PO TABS
50.0000 mg | ORAL_TABLET | Freq: Every day | ORAL | 0 refills | Status: DC | PRN
Start: 2023-04-12 — End: 2023-08-16

## 2023-04-12 MED ORDER — AUVELITY 45-105 MG PO TBCR
1.0000 | EXTENDED_RELEASE_TABLET | Freq: Every morning | ORAL | 1 refills | Status: DC
Start: 2023-04-12 — End: 2023-04-15

## 2023-04-12 MED ORDER — ESCITALOPRAM OXALATE 10 MG PO TABS: 10.0000 mg | ORAL_TABLET | Freq: Every day | ORAL | 1 refills | Status: DC

## 2023-04-12 MED ORDER — TRAZODONE HCL 50 MG PO TABS: 50.0000 mg | ORAL_TABLET | Freq: Every day | ORAL | 1 refills | Status: DC

## 2023-04-12 MED ORDER — AMLODIPINE BESYLATE 10 MG PO TABS: 10.0000 mg | ORAL_TABLET | Freq: Every day | ORAL | 1 refills | Status: DC

## 2023-04-12 MED ORDER — MODAFINIL 100 MG PO TABS: 100.0000 mg | ORAL_TABLET | Freq: Every day | ORAL | 0 refills | Status: DC

## 2023-04-12 MED ORDER — VALSARTAN-HYDROCHLOROTHIAZIDE 320-25 MG PO TABS: 1.0000 | ORAL_TABLET | Freq: Every day | ORAL | 1 refills | Status: DC

## 2023-04-12 NOTE — Progress Notes (Addendum)
 Name: Taylor Hurst   MRN: [id number]    DOB: [birth year]   Date:04/12/2023       Progress Note  Subjective  Chief Complaint  Chief Complaint  Patient presents with   Medical Management of Chronic Issues   HPI   HTN: She denies dizziness, chest pain, headaches, SOB . She is compliant with medications .    Morbid Obesity: she states she developed obesity after second child, went from 167 lbs to 225 lbs during pregnancy. Since Fall of 2019 she changed her diet, and we added Ozempic, it was curbing her appetite her weight was improving , no longer taking medication because of cost. Weight is down since Nov 2024. She has been meal prepping, eating more fruit and vegetables, avoiding snacking between meals. Adding more protein to her diet BMI over 35 with co-morbidities such as HTN, GERD, depression   OA: she is taking Tylenol during the day and tramadol qhs, she has been taking for many years. She continues to have daily aching pain. Symptoms on knees is  stable today 3/10, she takes Tramadol and it controls symptoms . Reviewed controlled substance database . Pain contract signed today    GAD/MDD: since August she has been on Auvelity, lexapro , since march off Seroquel and back on Trazodone for sleep. Seeing a counselor but went to see a NP but prefers if a manage her medications. 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 She takes modafinil and denies side effects    GERD/esophagitis/post-cholecystectomy diarrhea/elevated liver enzymes  : under the care of  [employer] PA at [facility], and is now on Nexium and Colestipol prn. 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   Mild major depression  (HCC) 08/03/2021   Other esophagitis without bleeding 04/08/2020   Morbid obesity (HCC) 03/28/2017   Hyperglycemia 12/22/2015   Tendonitis, Achilles, right 01/11/2015   Anxiety disorder 12/07/2014   Complete rotator cuff tear 11/18/2014   Hypercholesterolemia 07/30/2014   Osteoarthritis of both knees 07/30/2014   Chronic constipation 08/29/2011   Essential hypertension 05/30/2011   Insomnia 05/30/2011   GERD (gastroesophageal reflux disease) 05/30/2011    Past Surgical History:  Procedure Laterality Date   CHOLECYSTECTOMY  07/26/2011   INTRAOPERATIVE CHOLANGIOGRAM  07/26/2011   Procedure: INTRAOPERATIVE CHOLANGIOGRAM;  Surgeon: Ardeth Sportsman, MD;  Location: [facility];  Service: General;  Laterality: N/A;   LIVER BIOPSY  07/26/2011   Procedure: LIVER BIOPSY;  Surgeon: Ardeth Sportsman, MD;  Location: [facility];  Service: General;  Laterality: N/A;  core liver biopsy    osteomilitis  1983   right femur   osteomylitis  1988   left femur   SHOULDER OPEN ROTATOR CUFF REPAIR Right 11/18/2014   Procedure: RIGHT SHOULDER MINI OPEN ROTATOR CUFF REPAIR SUBACROMIAL DECOMPRESSION  ;  Surgeon: Jene Every, MD;  Location: [facility];  Service: Orthopedics;  Laterality: Right;    Family History  Problem Relation Age of Onset   COPD Mother    Hypertension Mother    Hyperlipidemia Mother    Glaucoma Mother    Pulmonary embolism Mother        July 2024  Alcohol abuse Father    Colon cancer Father    Liver cancer Father    Kidney failure Brother        He was waiting to get a kidney   Cancer Maternal Aunt        breast   Breast cancer Paternal Aunt        unknown age    Social History   Tobacco Use   Smoking status: Never   Smokeless tobacco: Never  Substance Use Topics   Alcohol use: Yes    Alcohol/week: 0.0 standard drinks of alcohol    Comment: occassionally     Current Outpatient Medications:    amLODipine (NORVASC) 10 MG tablet, Take 1 tablet (10 mg total) by mouth daily.,  Disp: 90 tablet, Rfl: 1   b complex vitamins tablet, Take 1 tablet by mouth daily., Disp: , Rfl:    colestipol (COLESTID) 1 g tablet, Take 1 g by mouth 2 (two) times daily., Disp: , Rfl:    Dextromethorphan-buPROPion ER (AUVELITY) 45-105 MG TBCR, TAKE ONE TABLET BY MOUTH EVERY MORNING, Disp: 30 tablet, Rfl: 0   diclofenac Sodium (VOLTAREN) 1 % GEL, Apply topically., Disp: , Rfl:    escitalopram (LEXAPRO) 10 MG tablet, Take 1 tablet (10 mg total) by mouth daily., Disp: 90 tablet, Rfl: 1   FIBER PO, Take 5 tablets by mouth 2 (two) times daily., Disp: , Rfl:    hydrOXYzine (ATARAX) 10 MG tablet, TAKE 1 TABLET DAILY AS NEEDED, Disp: 90 tablet, Rfl: 2   ketotifen (ZADITOR) 0.025 % ophthalmic solution, 1 drop 2 (two) times daily., Disp: , Rfl:    levonorgestrel (MIRENA) 20 MCG/24HR IUD, 1 each by Intrauterine route once. , Disp: , Rfl:    Magnesium 500 MG TABS, , Disp: , Rfl:    meloxicam (MOBIC) 15 MG tablet, TAKE 1 TABLET BY MOUTH EVERY DAY AS NEEDED WITH FOOD, Disp: , Rfl:    modafinil (PROVIGIL) 100 MG tablet, Take 1 tablet (100 mg total) by mouth daily., Disp: 90 tablet, Rfl: 0   Multiple Vitamin (MULTI-VITAMIN DAILY PO), Take 1 tablet by mouth daily with breakfast. Women's, Disp: , Rfl:    Omega-3 Fatty Acids (FISH OIL) 1000 MG CAPS, Take 1,000 mg by mouth 2 (two) times daily., Disp: , Rfl:    omeprazole (PRILOSEC) 40 MG capsule, Take by mouth., Disp: , Rfl:    traMADol (ULTRAM) 50 MG tablet, Take 1 tablet (50 mg total) by mouth daily as needed. To last 90 days, Disp: 90 tablet, Rfl: 0   traZODone (DESYREL) 50 MG tablet, Take 1 tablet (50 mg total) by mouth at bedtime., Disp: 60 tablet, Rfl: 1   valsartan-hydrochlorothiazide (DIOVAN-HCT) 320-25 MG tablet, Take 1 tablet by mouth daily., Disp: 90 tablet, Rfl: 1   BIOTIN PO, Take 10,000 mg by mouth daily. (Patient not taking: Reported on 02/11/2023), Disp: , Rfl:    methocarbamol (ROBAXIN) 500 MG tablet, Take 500 mg by mouth at bedtime as needed.  (Patient not taking: Reported on 04/12/2023), Disp: , Rfl:   Allergies  Allergen Reactions   Percocet [Oxycodone-Acetaminophen] Anaphylaxis    bronchospasms   Povidone Iodine Rash    blisters blisters Other reaction(s): Unknown   Adhesive [Tape] Itching   Allergy Relief  [Chlorpheniramine Maleate]     Heart flutters   Oxycodone-Acetaminophen     Other reaction(s): Unknown   Chlorpheniramine     Other reaction(s): irregular heart rate   Diphenhydramine Rash   Penicillins Hives and Rash  Has patient had a PCN reaction causing immediate rash, facial/tongue/throat swelling, SOB or lightheadedness with hypotension: no Has patient had a PCN reaction causing severe rash involving mucus membranes or skin necrosis: no Has patient had a PCN reaction that required hospitalization happened in the hospital Has patient had a PCN reaction occurring within the last 10 years: no If all of the above answers are "NO", then may proceed with Cephalosporin use.     I personally reviewed active problem list, medication list, allergies, family history with the patient/caregiver today.   ROS  Ten systems reviewed and is negative except as mentioned in HPI    Objective Physical Exam Constitutional: Patient appears well-developed and well-nourished. Obese  No distress.  HEENT: head atraumatic, normocephalic, pupils equal and reactive to light, neck supple Cardiovascular: Normal rate, regular rhythm and normal heart sounds.  No murmur heard. No BLE edema. Pulmonary/Chest: Effort normal and breath sounds normal. No respiratory distress. Abdominal: Soft.  There is no tenderness. Psychiatric: Patient has a normal mood and affect. behavior is normal. Judgment and thought content normal.   Vitals:   04/12/23 1419  BP: 132/84  Pulse: 99  Resp: 16  SpO2: 100%  Weight: 218 lb 3.2 oz (99 kg)  Height: 5\' 2"  (1.575 m)    Body mass index is 39.91 kg/m.  No results found for this or any previous  visit (from the past 2160 hours).  Diabetic Foot Exam:     PHQ2/9:    04/12/2023    2:18 PM 12/07/2022    1:53 PM 09/28/2022    2:29 PM 09/07/2022   11:39 AM 05/03/2022   10:59 AM  Depression screen PHQ 2/9  Decreased Interest 1 1 0 3 0  Down, Depressed, Hopeless 1 1 1 3 1   PHQ - 2 Score 2 2 1 6 1   Altered sleeping 1 0 0 3 3  Tired, decreased energy 1 1 0 3 3  Change in appetite 1 0 0 0 0  Feeling bad or failure about yourself  0 0 0 0 0  Trouble concentrating 1 0 0 0 0  Moving slowly or fidgety/restless 0 0 0 0 0  Suicidal thoughts 0 0 0 0 0  PHQ-9 Score 6 3 1 12 7   Difficult doing work/chores Very difficult Not difficult at all  Very difficult     phq 9 is positive  Fall Risk:    12/07/2022    1:52 PM 09/28/2022    2:24 PM 09/07/2022   11:38 AM 05/03/2022   10:59 AM 03/29/2022    1:55 PM  Fall Risk   Falls in the past year? 0 0 0 0 0  Number falls in past yr:  0 0 0 0  Injury with Fall?  0 0 0 0  Risk for fall due to : No Fall Risks No Fall Risks No Fall Risks No Fall Risks   Follow up Falls prevention discussed Falls prevention discussed Falls prevention discussed;Education provided;Falls evaluation completed Falls prevention discussed      Assessment & Plan  1. Essential hypertension  - amLODipine (NORVASC) 10 MG tablet; Take 1 tablet (10 mg total) by mouth daily.  Dispense: 90 tablet; Refill: 1 - valsartan-hydrochlorothiazide (DIOVAN-HCT) 320-25 MG tablet; Take 1 tablet by mouth daily.  Dispense: 90 tablet; Refill: 1  2. Mild major depression (HCC)  - escitalopram (LEXAPRO) 10 MG tablet; Take 1 tablet (10 mg total) by mouth daily.  Dispense: 90 tablet; Refill: 1 - Dextromethorphan-buPROPion ER (AUVELITY)  45-105 MG TBCR; Take 1 tablet by mouth every morning.  Dispense: 90 tablet; Refill: 1  3. Morbid obesity (HCC) (Primary)  Discussed with the patient the risk posed by an increased BMI. Discussed importance of portion control, calorie counting and at least 150  minutes of physical activity weekly. Avoid sweet beverages and drink more water. Eat at least 6 servings of fruit and vegetables daily    4. Shift work sleep disorder  - modafinil (PROVIGIL) 100 MG tablet; Take 1 tablet (100 mg total) by mouth daily.  Dispense: 90 tablet; Refill: 0  5. Encounter for screening mammogram for malignant neoplasm of breast  - MM 3D SCREENING MAMMOGRAM BILATERAL BREAST; Future  6. Generalized anxiety disorder  - escitalopram (LEXAPRO) 10 MG tablet; Take 1 tablet (10 mg total) by mouth daily.  Dispense: 90 tablet; Refill: 1  7. Dyslipidemia  On diet only   8. Gastroesophageal reflux disease without esophagitis  Controlled   9. Other insomnia  - traZODone (DESYREL) 50 MG tablet; Take 1 tablet (50 mg total) by mouth at bedtime.  Dispense: 90 tablet; Refill: 1  10. Primary osteoarthritis of both knees  - traMADol (ULTRAM) 50 MG tablet; Take 1 tablet (50 mg total) by mouth daily as needed. To last 90 days  Dispense: 90 tablet; Refill: 0

## 2023-04-15 ENCOUNTER — Other Ambulatory Visit: Payer: Self-pay

## 2023-04-15 DIAGNOSIS — F320 Major depressive disorder, single episode, mild: Secondary | ICD-10-CM

## 2023-04-15 MED ORDER — AUVELITY 45-105 MG PO TBCR: 1.0000 | EXTENDED_RELEASE_TABLET | Freq: Two times a day (BID) | ORAL | 1 refills | Status: DC

## 2023-04-22 ENCOUNTER — Other Ambulatory Visit: Payer: Self-pay | Admitting: Family Medicine

## 2023-04-22 NOTE — Telephone Encounter (Signed)
 AUVELITY  This medication or product was previously approved on [id number] from 2023-02-06 to 2024-02-06. **Please note: This request was submitted electronically. Formulary lowering, tiering exception, cost reduction and/or pre-benefit determination review (including prospective Medicare hospice reviews) requests cannot be requested using this method of submission. Providers contact us at [phone] for further assistance.

## 2023-05-02 ENCOUNTER — Telehealth: Payer: Self-pay | Admitting: Family Medicine

## 2023-05-02 NOTE — Telephone Encounter (Signed)
 Been sent to [facility] mail

## 2023-05-02 NOTE — Telephone Encounter (Signed)
 Refill from [facility]  Dextromethorphan-buPROPion ER (AUVELITY) 45-105 MG TBCR

## 2023-05-09 ENCOUNTER — Ambulatory Visit
Admission: RE | Admit: 2023-05-09 | Discharge: 2023-05-09 | Disposition: A | Discharge status: Home / Self Care | Source: Ambulatory Visit | Attending: Family Medicine | Admitting: Family Medicine

## 2023-05-09 DIAGNOSIS — Z1231 Encounter for screening mammogram for malignant neoplasm of breast: Secondary | ICD-10-CM | POA: Diagnosis present

## 2023-05-13 ENCOUNTER — Other Ambulatory Visit: Payer: Self-pay | Admitting: Family Medicine

## 2023-05-13 DIAGNOSIS — F320 Major depressive disorder, single episode, mild: Secondary | ICD-10-CM

## 2023-05-16 ENCOUNTER — Telehealth: Payer: Self-pay

## 2023-05-16 NOTE — Telephone Encounter (Signed)
 PA denied for auvelity 

## 2023-05-17 ENCOUNTER — Encounter: Payer: Self-pay | Admitting: Family Medicine

## 2023-06-27 ENCOUNTER — Other Ambulatory Visit: Payer: Self-pay | Admitting: Family Medicine

## 2023-06-27 ENCOUNTER — Encounter: Payer: Self-pay | Admitting: Family Medicine

## 2023-06-27 MED ORDER — BUPROPION HCL ER (XL) 150 MG PO TB24: 150.0000 mg | ORAL_TABLET | Freq: Every day | ORAL | 0 refills | Status: DC

## 2023-08-16 ENCOUNTER — Ambulatory Visit: Admitting: Family Medicine

## 2023-08-16 ENCOUNTER — Telehealth: Payer: Self-pay | Admitting: Family Medicine

## 2023-08-16 ENCOUNTER — Other Ambulatory Visit (HOSPITAL_COMMUNITY): Payer: Self-pay

## 2023-08-16 DIAGNOSIS — F411 Generalized anxiety disorder: Secondary | ICD-10-CM

## 2023-08-16 DIAGNOSIS — G4726 Circadian rhythm sleep disorder, shift work type: Secondary | ICD-10-CM

## 2023-08-16 DIAGNOSIS — E785 Hyperlipidemia, unspecified: Secondary | ICD-10-CM

## 2023-08-16 DIAGNOSIS — I10 Essential (primary) hypertension: Secondary | ICD-10-CM

## 2023-08-16 DIAGNOSIS — Z23 Encounter for immunization: Secondary | ICD-10-CM | POA: Diagnosis not present

## 2023-08-16 DIAGNOSIS — F330 Major depressive disorder, recurrent, mild: Secondary | ICD-10-CM | POA: Diagnosis not present

## 2023-08-16 DIAGNOSIS — G4709 Other insomnia: Secondary | ICD-10-CM

## 2023-08-16 DIAGNOSIS — K219 Gastro-esophageal reflux disease without esophagitis: Secondary | ICD-10-CM | POA: Diagnosis not present

## 2023-08-16 DIAGNOSIS — M170 Bilateral primary osteoarthritis of knee: Secondary | ICD-10-CM

## 2023-08-16 MED ORDER — ESCITALOPRAM OXALATE 10 MG PO TABS: 10.0000 mg | ORAL_TABLET | Freq: Every day | ORAL | 1 refills | Status: AC

## 2023-08-16 MED ORDER — BUPROPION HCL ER (XL) 150 MG PO TB24: 150.0000 mg | ORAL_TABLET | Freq: Every day | ORAL | 0 refills | Status: DC

## 2023-08-16 MED ORDER — TRAMADOL HCL 50 MG PO TABS: 50.0000 mg | ORAL_TABLET | Freq: Every day | ORAL | 0 refills | Status: DC | PRN

## 2023-08-16 MED ORDER — AMLODIPINE BESYLATE 10 MG PO TABS: 5.0000 mg | ORAL_TABLET | Freq: Every day | ORAL | 0 refills | Status: DC

## 2023-08-16 MED ORDER — OMEPRAZOLE 20 MG PO CPDR: 20.0000 mg | DELAYED_RELEASE_CAPSULE | Freq: Every day | ORAL | 0 refills | Status: DC

## 2023-08-16 MED ORDER — HYDROXYZINE HCL 10 MG PO TABS
10.0000 mg | ORAL_TABLET | Freq: Every day | ORAL | 0 refills | Status: DC | PRN
Start: 2023-08-16 — End: 2023-11-22

## 2023-08-16 MED ORDER — MODAFINIL 100 MG PO TABS: 100.0000 mg | ORAL_TABLET | Freq: Every day | ORAL | 0 refills | Status: DC

## 2023-08-16 NOTE — Telephone Encounter (Signed)
 Alternative request for modafinil  100mg   1 tablet by mouth daily

## 2023-08-16 NOTE — Progress Notes (Signed)
 Name: Taylor Hurst   MRN: [id number]    DOB: 1965-05-08   Date:08/16/2023       Progress Note  Subjective  Chief Complaint  Chief Complaint  Patient presents with   Medical Management of Chronic Issues   Discussed the use of AI scribe software for clinical note transcription with the patient, who gave verbal consent to proceed.  History of Present Illness Taylor Hurst is a 58 year old female with recurrent mild major depression and hypertension who presents for a regular follow-up visit.  She has a history of recurrent mild major depression and is currently taking Lexapro  and Wellbutrin  150 mg XL. She reports improved sleep but notes a recent increase in lack of interest and feeling down, attributed to grieving the recent passing of her sister-in-law.  She has been experiencing weight loss, having decreased from 218 lbs in March to 206.4 lbs currently. Her BMI is 37.75. She attributes this weight loss to following a regimen of intermittent fasting and consuming a diet rich in proteins, vegetables, and fruits, while avoiding fried foods, beef, and pork. She reports feeling better since losing weight.  Her hypertension is managed with amlodipine  10 mg and valsartan  HCTZ 320/25 mg. No dizziness or lightheadedness.  No current symptoms of GERD, although she was started on omeprazole following gallbladder surgery. She is currently on omeprazole 40 mg.  For sleep, she takes trazodone  50 mg, which is effective. She also takes modafinil  for shift work sleep disorder, but reports that it is not working for her.  She takes tramadol  for arthritis pain, which she rates as 3-4/10, worsening with activity. She alternates this with Tylenol  Arthritis. She also uses hydroxyzine  for anxiety, which she reports needing a refill for.    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   Mild major depression (HCC) 08/03/2021    Other esophagitis without bleeding 04/08/2020   Morbid obesity (HCC) 03/28/2017   Hyperglycemia 12/22/2015   Tendonitis, Achilles, right 01/11/2015   Anxiety disorder 12/07/2014   Complete rotator cuff tear 11/18/2014   Hypercholesterolemia 07/30/2014   Osteoarthritis of both knees 07/30/2014   Chronic constipation 08/29/2011   Essential hypertension 05/30/2011   Insomnia 05/30/2011   GERD (gastroesophageal reflux disease) 05/30/2011    Past Surgical History:  Procedure Laterality Date   CHOLECYSTECTOMY  07/26/2011   INTRAOPERATIVE CHOLANGIOGRAM  07/26/2011   Procedure: INTRAOPERATIVE CHOLANGIOGRAM;  Surgeon: Elspeth KYM Schultze, MD;  Location: [facility];  Service: General;  Laterality: N/A;   LIVER BIOPSY  07/26/2011   Procedure: LIVER BIOPSY;  Surgeon: Elspeth KYM Schultze, MD;  Location: [facility];  Service: General;  Laterality: N/A;  core liver biopsy    osteomilitis  1983   right femur   osteomylitis  1988   left femur   SHOULDER OPEN ROTATOR CUFF REPAIR Right 11/18/2014   Procedure: RIGHT SHOULDER MINI OPEN ROTATOR CUFF REPAIR SUBACROMIAL DECOMPRESSION  ;  Surgeon: Reyes Billing, MD;  Location: [facility];  Service: Orthopedics;  Laterality: Right;    Family History  Problem Relation Age of Onset   COPD Mother    Hypertension Mother    Hyperlipidemia Mother    Glaucoma Mother    Pulmonary embolism Mother        July 2024   Alcohol abuse Father    Colon cancer Father    Liver cancer Father    Kidney failure Brother  He was waiting to get a kidney   Cancer Maternal Aunt        breast   Breast cancer Paternal Aunt        unknown age   Cancer Maternal Aunt    Diabetes Maternal Aunt     Social History   Tobacco Use   Smoking status: Never   Smokeless tobacco: Never  Substance Use Topics   Alcohol use: Yes    Alcohol/week: 0.0 standard drinks of alcohol    Comment: occassionally     Current Outpatient Medications:    b complex vitamins tablet, Take 1 tablet by  mouth daily., Disp: , Rfl:    colestipol (COLESTID) 1 g tablet, Take 1 g by mouth 2 (two) times daily., Disp: , Rfl:    diclofenac  Sodium (VOLTAREN ) 1 % GEL, Apply topically., Disp: , Rfl:    FIBER PO, Take 5 tablets by mouth 2 (two) times daily., Disp: , Rfl:    ketotifen (ZADITOR) 0.025 % ophthalmic solution, 1 drop 2 (two) times daily., Disp: , Rfl:    levonorgestrel  (MIRENA ) 20 MCG/24HR IUD, 1 each by Intrauterine route once. , Disp: , Rfl:    Magnesium  500 MG TABS, , Disp: , Rfl:    meloxicam  (MOBIC ) 15 MG tablet, TAKE 1 TABLET BY MOUTH EVERY DAY AS NEEDED WITH FOOD, Disp: , Rfl:    Multiple Vitamin (MULTI-VITAMIN DAILY PO), Take 1 tablet by mouth daily with breakfast. Women's, Disp: , Rfl:    Omega-3 Fatty Acids (FISH OIL) 1000 MG CAPS, Take 1,000 mg by mouth 2 (two) times daily., Disp: , Rfl:    traZODone  (DESYREL ) 50 MG tablet, Take 1 tablet (50 mg total) by mouth at bedtime., Disp: 90 tablet, Rfl: 1   valsartan -hydrochlorothiazide  (DIOVAN -HCT) 320-25 MG tablet, Take 1 tablet by mouth daily., Disp: 90 tablet, Rfl: 1   amLODipine  (NORVASC ) 10 MG tablet, Take 0.5-1 tablets (5-10 mg total) by mouth daily., Disp: 1 tablet, Rfl: 0   buPROPion  (WELLBUTRIN  XL) 150 MG 24 hr tablet, Take 1 tablet (150 mg total) by mouth daily., Disp: 90 tablet, Rfl: 0   escitalopram  (LEXAPRO ) 10 MG tablet, Take 1 tablet (10 mg total) by mouth daily., Disp: 90 tablet, Rfl: 1   hydrOXYzine  (ATARAX ) 10 MG tablet, Take 1 tablet (10 mg total) by mouth daily as needed., Disp: 90 tablet, Rfl: 0   modafinil  (PROVIGIL ) 100 MG tablet, Take 1 tablet (100 mg total) by mouth daily., Disp: 90 tablet, Rfl: 0   omeprazole (PRILOSEC) 20 MG capsule, Take 1 capsule (20 mg total) by mouth daily., Disp: 90 capsule, Rfl: 0   traMADol  (ULTRAM ) 50 MG tablet, Take 1 tablet (50 mg total) by mouth daily as needed. To last 90 days, Disp: 90 tablet, Rfl: 0  Allergies  Allergen Reactions   Percocet [Oxycodone -Acetaminophen ] Anaphylaxis     bronchospasms   Povidone Iodine Rash and Dermatitis    blisters  Other reaction(s): Unknown   Adhesive [Tape] Itching   Allergy Relief  [Chlorpheniramine Maleate]     Heart flutters   Oxycodone -Acetaminophen      Other reaction(s): Unknown   Chlorpheniramine     Other reaction(s): irregular heart rate   Diphenhydramine Rash   Penicillins Hives and Rash    Has patient had a PCN reaction causing immediate rash, facial/tongue/throat swelling, SOB or lightheadedness with hypotension: no Has patient had a PCN reaction causing severe rash involving mucus membranes or skin necrosis: no Has patient had a PCN reaction that required hospitalization  happened in the hospital Has patient had a PCN reaction occurring within the last 10 years: no If all of the above answers are NO, then may proceed with Cephalosporin use.     I personally reviewed active problem list, medication list, allergies with the patient/caregiver today.   ROS  Ten systems reviewed and is negative except as mentioned in HPI    Objective Physical Exam VITALS: BP- 116/76 MEASUREMENTS: Weight- 206.4, BMI- 37.75. CONSTITUTIONAL: Patient appears well-developed and well-nourished.  No distress. HEENT: Head atraumatic, normocephalic, neck supple. CARDIOVASCULAR: Normal rate, regular rhythm and normal heart sounds.  No murmur heard. No BLE edema. PULMONARY: Effort normal and breath sounds normal. No respiratory distress. ABDOMINAL: There is no tenderness or distention. MUSCULOSKELETAL: Normal gait. Without gross motor or sensory deficit. PSYCHIATRIC: Patient has a normal mood and affect. behavior is normal. Judgment and thought content normal.  Vitals:   08/16/23 0949  BP: 116/76  Pulse: 93  Resp: 16  SpO2: 95%  Weight: 206 lb 6.4 oz (93.6 kg)  Height: 5' 2 (1.575 m)    Body mass index is 37.75 kg/m.   PHQ2/9:    08/16/2023    9:48 AM 04/12/2023    2:18 PM 12/07/2022    1:53 PM 09/28/2022    2:29 PM  09/07/2022   11:39 AM  Depression screen PHQ 2/9  Decreased Interest 2 1 1  0 3  Down, Depressed, Hopeless 2 1 1 1 3   PHQ - 2 Score 4 2 2 1 6   Altered sleeping 0 1 0 0 3  Tired, decreased energy 0 1 1 0 3  Change in appetite 0 1 0 0 0  Feeling bad or failure about yourself  0 0 0 0 0  Trouble concentrating 1 1 0 0 0  Moving slowly or fidgety/restless 0 0 0 0 0  Suicidal thoughts 0 0 0 0 0  PHQ-9 Score 5 6 3 1 12   Difficult doing work/chores Very difficult Very difficult Not difficult at all  Very difficult    phq 9 is positive  Fall Risk:    08/16/2023    9:48 AM 12/07/2022    1:52 PM 09/28/2022    2:24 PM 09/07/2022   11:38 AM 05/03/2022   10:59 AM  Fall Risk   Falls in the past year? 1 0 0 0 0  Number falls in past yr: 0  0 0 0  Injury with Fall? 0  0 0 0  Risk for fall due to : Impaired balance/gait No Fall Risks No Fall Risks No Fall Risks No Fall Risks  Follow up Falls evaluation completed Falls prevention discussed Falls prevention discussed Falls prevention discussed;Education provided;Falls evaluation completed Falls prevention discussed     Assessment & Plan Major depressive disorder, recurrent, mild with comorbid anxiety disorder Recurrent mild major depressive disorder with comorbid anxiety disorder. PHQ-9 score improved to 5. Medications effective, no changes desired. - Continue Lexapro  10 mg daily. - Continue Wellbutrin  XL 150 mg daily. - Prescribe hydroxyzine  for anxiety as needed.  Hypertension Hypertension well-controlled. Recent weight loss improved blood pressure. Current reading 116/76 mmHg. Reduced amlodipine  due to improved control. - Reduce amlodipine  from 10 mg to 5 mg daily by cutting the pill in half. - Continue valsartan  HCTZ 320/25 mg daily. - Return in 2 weeks for blood pressure check.  Morbid obesity Morbid obesity with significant weight loss from 218 lbs to 206.4 lbs. BMI over 35 with co morbidities such as HTN. Achieved through lifestyle  modifications. - Continue current dietary regimen and lifestyle modifications.  Primary osteoarthritis Primary osteoarthritis with pain level 3-4/10, increasing with activity. Pain managed with tramadol  and Tylenol  arthritis. - Prescribe tramadol  50 mg, 90 tablets, to be taken as needed for pain. - Continue Tylenol  arthritis as needed.  Gastroesophageal reflux disease (GERD) GERD symptoms well-controlled. Weight loss and dietary changes contributed to improvement. Plan to reduce omeprazole dosage. - Reduce omeprazole to 20 mg daily. - Alternate 40 mg and 20 mg doses if needed.  Insomnia Insomnia managed with trazodone  50 mg, effective in aiding sleep. - Continue trazodone  50 mg at bedtime.  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 - Continue modafinil  as prescribed before work.  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 Colestipol used as needed. - Continue colestipol as needed for symptoms.

## 2023-08-16 NOTE — Telephone Encounter (Signed)
 Good afternoon, we always test bill any medication to see the actual rejection but in this case she has 2 prescription coverages and I get a paid claim with both:      And the top insurance show PA expires 09/11/23

## 2023-08-16 NOTE — Telephone Encounter (Signed)
 Pt states she will try to use [facility] to get medication if bumps to any issues she will call us  back.

## 2023-08-16 NOTE — Telephone Encounter (Signed)
 PCP out of office any suggestions of which alternatives or can PA be done?

## 2023-09-05 ENCOUNTER — Ambulatory Visit

## 2023-09-05 VITALS — BP 124/86

## 2023-09-05 DIAGNOSIS — I10 Essential (primary) hypertension: Secondary | ICD-10-CM

## 2023-09-05 DIAGNOSIS — Z0130 Encounter for examination of blood pressure without abnormal findings: Secondary | ICD-10-CM

## 2023-09-05 NOTE — Progress Notes (Signed)
 Patient is in office today for a nurse visit for Blood Pressure Check. Patient blood pressure was 124/86, Patient No chest pain, No shortness of breath, No dyspnea on exertion

## 2023-10-22 ENCOUNTER — Other Ambulatory Visit: Payer: Self-pay | Admitting: Family Medicine

## 2023-10-22 DIAGNOSIS — K219 Gastro-esophageal reflux disease without esophagitis: Secondary | ICD-10-CM

## 2023-11-05 ENCOUNTER — Other Ambulatory Visit: Payer: Self-pay | Admitting: Family Medicine

## 2023-11-05 DIAGNOSIS — G4709 Other insomnia: Secondary | ICD-10-CM

## 2023-11-09 ENCOUNTER — Other Ambulatory Visit: Payer: Self-pay | Admitting: Family Medicine

## 2023-11-09 DIAGNOSIS — F330 Major depressive disorder, recurrent, mild: Secondary | ICD-10-CM

## 2023-11-22 ENCOUNTER — Ambulatory Visit: Admitting: Family Medicine

## 2023-11-22 DIAGNOSIS — G4709 Other insomnia: Secondary | ICD-10-CM | POA: Diagnosis not present

## 2023-11-22 DIAGNOSIS — G4726 Circadian rhythm sleep disorder, shift work type: Secondary | ICD-10-CM

## 2023-11-22 DIAGNOSIS — M170 Bilateral primary osteoarthritis of knee: Secondary | ICD-10-CM

## 2023-11-22 DIAGNOSIS — K219 Gastro-esophageal reflux disease without esophagitis: Secondary | ICD-10-CM

## 2023-11-22 DIAGNOSIS — I10 Essential (primary) hypertension: Secondary | ICD-10-CM

## 2023-11-22 DIAGNOSIS — F411 Generalized anxiety disorder: Secondary | ICD-10-CM

## 2023-11-22 DIAGNOSIS — E785 Hyperlipidemia, unspecified: Secondary | ICD-10-CM

## 2023-11-22 DIAGNOSIS — F330 Major depressive disorder, recurrent, mild: Secondary | ICD-10-CM | POA: Diagnosis not present

## 2023-11-22 MED ORDER — VALSARTAN-HYDROCHLOROTHIAZIDE 320-25 MG PO TABS: 1.0000 | ORAL_TABLET | Freq: Every day | ORAL | 1 refills | Status: AC

## 2023-11-22 MED ORDER — TRAMADOL HCL 50 MG PO TABS
50.0000 mg | ORAL_TABLET | Freq: Three times a day (TID) | ORAL | 0 refills | Status: AC | PRN
Start: 2023-11-22 — End: ?

## 2023-11-22 MED ORDER — AMLODIPINE BESYLATE 5 MG PO TABS: 5.0000 mg | ORAL_TABLET | Freq: Every day | ORAL | 1 refills | Status: AC

## 2023-11-22 MED ORDER — HYDROXYZINE HCL 10 MG PO TABS: 10.0000 mg | ORAL_TABLET | Freq: Every day | ORAL | 0 refills | Status: AC | PRN

## 2023-11-22 MED ORDER — BUPROPION HCL ER (XL) 300 MG PO TB24: 300.0000 mg | ORAL_TABLET | Freq: Every day | ORAL | 1 refills | Status: AC

## 2023-11-22 MED ORDER — OMEPRAZOLE 20 MG PO CPDR: 20.0000 mg | DELAYED_RELEASE_CAPSULE | Freq: Every day | ORAL | 1 refills | Status: AC

## 2023-11-22 MED ORDER — MODAFINIL 100 MG PO TABS: 100.0000 mg | ORAL_TABLET | Freq: Every day | ORAL | 0 refills | Status: AC

## 2023-11-22 MED ORDER — TRAMADOL HCL 50 MG PO TABS: 50.0000 mg | ORAL_TABLET | Freq: Three times a day (TID) | ORAL | 0 refills | Status: DC | PRN

## 2023-11-22 MED ORDER — TRAZODONE HCL 50 MG PO TABS: 50.0000 mg | ORAL_TABLET | Freq: Every day | ORAL | 0 refills | Status: DC

## 2023-11-22 NOTE — Progress Notes (Addendum)
 Name: Taylor Hurst   MRN: [id number]    DOB: 07-04-1965   Date:11/22/2023       Progress Note  Subjective  Chief Complaint  Chief Complaint  Patient presents with   Medical Management of Chronic Issues   Discussed the use of AI scribe software for clinical note transcription with the patient, who gave verbal consent to proceed.  History of Present Illness Taylor Hurst is a 58 year old female with primary osteoarthritis of the knees who presents with knee pain.  She experiences knee pain, particularly exacerbated by cold weather, resulting in aching discomfort. She uses Voltaren  gel and ice for relief, which provides some benefit. She also takes Tylenol  Arthritis during the day and occasionally uses Tramadol , which she has taken three times this week.  Her weight has remained stable at 207 pounds since her last visit in August. She is currently taking Wellbutrin  for depression, which she feels helps with her energy levels. She has a history of major depressive disorder. Her most recent PHQ-9 score was zero. She experienced anticipatory grief in July due to her sister-in-law's illness, who passed away in 2023-10-05.  She is on Amlodipine  mg and valsartan  hydrochlorothiazide   for hypertension. She denies chest pain or palpitation  For her reflux, she takes Omeprazole  20 mg daily, which she finds effective. She avoids caffeine and spicy foods to manage her symptoms.  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 She also uses Hydroxyzine  for anxiety, primarily on workdays.  No chest pain, palpitations, shortness of breath, or swelling. She reports that Omeprazole  helps her reflux symptoms.    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   Mild major depression 08/03/2021   Other esophagitis without bleeding 04/08/2020   Morbid obesity (HCC)  03/28/2017   Hyperglycemia 12/22/2015   Tendonitis, Achilles, right 01/11/2015   Anxiety disorder 12/07/2014   Complete rotator cuff tear 11/18/2014   Hypercholesterolemia 07/30/2014   Osteoarthritis of both knees 07/30/2014   Chronic constipation 08/29/2011   Essential hypertension 05/30/2011   Insomnia 05/30/2011   GERD (gastroesophageal reflux disease) 05/30/2011    Past Surgical History:  Procedure Laterality Date   CHOLECYSTECTOMY  07/26/2011   INTRAOPERATIVE CHOLANGIOGRAM  07/26/2011   Procedure: INTRAOPERATIVE CHOLANGIOGRAM;  Surgeon: Elspeth KYM Schultze, MD;  Location: [facility];  Service: General;  Laterality: N/A;   LIVER BIOPSY  07/26/2011   Procedure: LIVER BIOPSY;  Surgeon: Elspeth KYM Schultze, MD;  Location: [facility];  Service: General;  Laterality: N/A;  core liver biopsy    osteomilitis  1983   right femur   osteomylitis  1988   left femur   SHOULDER OPEN ROTATOR CUFF REPAIR Right 11/18/2014   Procedure: RIGHT SHOULDER MINI OPEN ROTATOR CUFF REPAIR SUBACROMIAL DECOMPRESSION  ;  Surgeon: Reyes Billing, MD;  Location: [facility];  Service: Orthopedics;  Laterality: Right;    Family History  Problem Relation Age of Onset   COPD Mother    Hypertension Mother    Hyperlipidemia Mother    Glaucoma Mother    Pulmonary embolism Mother        July 2024   Alcohol abuse Father    Colon cancer Father    Liver cancer Father    Kidney failure Brother        He was waiting to get a kidney   Cancer Maternal Aunt  breast   Breast cancer Paternal Aunt        unknown age   Cancer Maternal Aunt    Diabetes Maternal Aunt     Social History   Tobacco Use   Smoking status: Never   Smokeless tobacco: Never  Substance Use Topics   Alcohol use: Yes    Alcohol/week: 0.0 standard drinks of alcohol    Comment: occassionally     Current Outpatient Medications:    amLODipine  (NORVASC ) 10 MG tablet, Take 0.5-1 tablets (5-10 mg total) by mouth daily., Disp: 1 tablet, Rfl: 0   b  complex vitamins tablet, Take 1 tablet by mouth daily., Disp: , Rfl:    buPROPion  (WELLBUTRIN  XL) 150 MG 24 hr tablet, TAKE 1 TABLET BY MOUTH DAILY, Disp: 90 tablet, Rfl: 0   colestipol (COLESTID) 1 g tablet, Take 1 g by mouth 2 (two) times daily., Disp: , Rfl:    diclofenac  Sodium (VOLTAREN ) 1 % GEL, Apply topically., Disp: , Rfl:    escitalopram  (LEXAPRO ) 10 MG tablet, Take 1 tablet (10 mg total) by mouth daily., Disp: 90 tablet, Rfl: 1   FIBER PO, Take 5 tablets by mouth 2 (two) times daily., Disp: , Rfl:    hydrOXYzine  (ATARAX ) 10 MG tablet, Take 1 tablet (10 mg total) by mouth daily as needed., Disp: 90 tablet, Rfl: 0   ketotifen (ZADITOR) 0.025 % ophthalmic solution, 1 drop 2 (two) times daily., Disp: , Rfl:    levonorgestrel  (MIRENA ) 20 MCG/24HR IUD, 1 each by Intrauterine route once. , Disp: , Rfl:    Magnesium  500 MG TABS, , Disp: , Rfl:    meloxicam  (MOBIC ) 15 MG tablet, TAKE 1 TABLET BY MOUTH EVERY DAY AS NEEDED WITH FOOD, Disp: , Rfl:    modafinil  (PROVIGIL ) 100 MG tablet, Take 1 tablet (100 mg total) by mouth daily., Disp: 90 tablet, Rfl: 0   Multiple Vitamin (MULTI-VITAMIN DAILY PO), Take 1 tablet by mouth daily with breakfast. Women's, Disp: , Rfl:    Omega-3 Fatty Acids (FISH OIL) 1000 MG CAPS, Take 1,000 mg by mouth 2 (two) times daily., Disp: , Rfl:    omeprazole  (PRILOSEC) 20 MG capsule, Take 1 capsule (20 mg total) by mouth daily., Disp: 90 capsule, Rfl: 0   traMADol  (ULTRAM ) 50 MG tablet, Take 1 tablet (50 mg total) by mouth daily as needed. To last 90 days, Disp: 90 tablet, Rfl: 0   traZODone  (DESYREL ) 50 MG tablet, TAKE 1 TABLET BY MOUTH AT  BEDTIME, Disp: 90 tablet, Rfl: 0   valsartan -hydrochlorothiazide  (DIOVAN -HCT) 320-25 MG tablet, Take 1 tablet by mouth daily., Disp: 90 tablet, Rfl: 1  Allergies  Allergen Reactions   Percocet [Oxycodone -Acetaminophen ] Anaphylaxis    bronchospasms   Povidone Iodine Rash and Dermatitis    blisters  Other reaction(s): Unknown    Adhesive [Tape] Itching   Allergy Relief  [Chlorpheniramine Maleate]     Heart flutters   Oxycodone -Acetaminophen      Other reaction(s): Unknown   Chlorpheniramine     Other reaction(s): irregular heart rate   Diphenhydramine Rash   Penicillins Hives and Rash    Has patient had a PCN reaction causing immediate rash, facial/tongue/throat swelling, SOB or lightheadedness with hypotension: no Has patient had a PCN reaction causing severe rash involving mucus membranes or skin necrosis: no Has patient had a PCN reaction that required hospitalization happened in the hospital Has patient had a PCN reaction occurring within the last 10 years: no If all of the above answers are  NO, then may proceed with Cephalosporin use.     I personally reviewed active problem list, medication list, allergies, family history with the patient/caregiver today.   ROS  Ten systems reviewed and is negative except as mentioned in HPI    Objective Physical Exam  CONSTITUTIONAL: Patient appears well-developed and well-nourished.  No distress. HEENT: Head atraumatic, normocephalic, neck supple. CARDIOVASCULAR: Normal rate, regular rhythm and normal heart sounds.  No murmur heard. No BLE edema. PULMONARY: Effort normal and breath sounds normal. No respiratory distress. ABDOMINAL: There is no tenderness or distention. MUSCULOSKELETAL: Normal gait. Without gross motor or sensory deficit. PSYCHIATRIC: Patient has a normal mood and affect. behavior is normal. Judgment and thought content normal.  Vitals:   11/22/23 1026  BP: 126/76  Pulse: 77  Resp: 16  SpO2: 99%  Weight: 207 lb 8 oz (94.1 kg)  Height: 5' 2 (1.575 m)    Body mass index is 37.95 kg/m.   PHQ2/9:    11/22/2023   10:22 AM 08/16/2023    9:48 AM 04/12/2023    2:18 PM 12/07/2022    1:53 PM 09/28/2022    2:29 PM  Depression screen PHQ 2/9  Decreased Interest 0 2 1 1  0  Down, Depressed, Hopeless 0 2 1 1 1   PHQ - 2 Score 0 4 2 2 1    Altered sleeping 0 0 1 0 0  Tired, decreased energy 0 0 1 1 0  Change in appetite 0 0 1 0 0  Feeling bad or failure about yourself  0 0 0 0 0  Trouble concentrating 0 1 1 0 0  Moving slowly or fidgety/restless 0 0 0 0 0  Suicidal thoughts 0 0 0 0 0  PHQ-9 Score 0 5  6  3  1    Difficult doing work/chores Not difficult at all Very difficult Very difficult Not difficult at all      Data saved with a previous flowsheet row definition    phq 9 is negative  Fall Risk:    11/22/2023   10:22 AM 08/16/2023    9:48 AM 12/07/2022    1:52 PM 09/28/2022    2:24 PM 09/07/2022   11:38 AM  Fall Risk   Falls in the past year? 0 1 0 0 0  Number falls in past yr: 0 0  0 0  Injury with Fall? 0 0  0 0  Risk for fall due to : No Fall Risks Impaired balance/gait No Fall Risks No Fall Risks No Fall Risks  Follow up Falls evaluation completed Falls evaluation completed Falls prevention discussed Falls prevention discussed Falls prevention discussed;Education provided;Falls evaluation completed     Assessment & Plan Primary osteoarthritis of both knees Chronic knee pain managed with medications. - Continue Voltaren  gel. - Apply ice packs to knees. - Prescribed tramadol  #90 to last 3 months  Morbid obesity BMI over 35 with co-morbidities such as HTN and GERD,  weight stable at 207 lbs. Wellbutrin  may aid weight management, we will increase dose to 300 mg   Essential hypertension Blood pressure controlled. Prescription error with amlodipine  noted. - Prescribed amlodipine  5 mg daily. - Prescribed valsartan  320/25 mg daily.  Gastroesophageal reflux disease without esophagitis Reflux managed with omeprazole . Lifestyle modifications discussed. - Continue omeprazole  20 mg daily. - Encouraged lifestyle modifications.  Dyslipidemia Managed with diet and exercise.  Major depressive disorder,  recurrent mild  Depression well-managed with Wellbutrin . PHQ-9 improved. Seasonal affective disorder  discussed.  Generalized anxiety disorder Anxiety managed  with hydroxyzine . - Prescribed hydroxyzine .  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 - Prescribed modafinil .

## 2024-01-07 ENCOUNTER — Encounter: Payer: Self-pay | Admitting: Licensed Practical Nurse

## 2024-01-07 ENCOUNTER — Ambulatory Visit: Admitting: Licensed Practical Nurse

## 2024-01-07 ENCOUNTER — Other Ambulatory Visit (HOSPITAL_COMMUNITY)
Admission: RE | Admit: 2024-01-07 | Discharge: 2024-01-07 | Disposition: A | Discharge status: Home / Self Care | Source: Ambulatory Visit | Attending: Licensed Practical Nurse | Admitting: Licensed Practical Nurse

## 2024-01-07 VITALS — BP 161/84 | HR 79 | Ht 61.0 in | Wt 214.8 lb

## 2024-01-07 DIAGNOSIS — Z124 Encounter for screening for malignant neoplasm of cervix: Secondary | ICD-10-CM | POA: Insufficient documentation

## 2024-01-07 DIAGNOSIS — Z01419 Encounter for gynecological examination (general) (routine) without abnormal findings: Secondary | ICD-10-CM | POA: Diagnosis not present

## 2024-01-07 NOTE — Progress Notes (Signed)
 "    Gynecology Annual Exam  PCP: Glenard Mire, MD  Chief Complaint:  Chief Complaint  Patient presents with   Annual Exam    History of Present Illness:Patient is a 58 y.o. G2P2002 presents for annual exam. The patient has no complaints today.   Now seeing a therapist  I am at peace now: Tearful when discussing her relationship with her husband, they are affectionate but do not have IC, this does bother her, she has spoken to him but he seems to have not taken any imitative  to improve this.   LMP: No LMP recorded (lmp unknown). (Menstrual status: IUD). Menarche:12 No cycle since 2011 Menopausal sxs: gets hot-but has not happened in awhile   The patient is not sexually active (husband has has issues) . She denies dyspareunia.  The patient does perform self breast exams.  There is notable family history of breast ( paternal aunt) or ovarian cancer in her family.  The patient wears seatbelts: yes.   The patient has regular exercise: yes.  On her feet all day, treadmill, vibrating machine and resistance bands   BP took meds  this am, normally 120's/80's, has been busy today getting her mother to medical appointments   The patient denies current symptoms of depression.   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    Mild major depression 08/03/2021    Other esophagitis without bleeding 04/08/2020    Morbid obesity (HCC) 03/28/2017    Hyperglycemia 12/22/2015    Tendonitis, Achilles, right 01/11/2015     Anxiety disorder 12/07/2014    Complete rotator cuff tear 11/18/2014    Hypercholesterolemia 07/30/2014    Osteoarthritis of both knees 07/30/2014    Chronic constipation 08/29/2011    Essential hypertension 05/30/2011    Insomnia 05/30/2011    GERD (gastroesophageal reflux disease) 05/30/2011     Past Surgical History:  Past Surgical History:  Procedure Laterality Date   CHOLECYSTECTOMY  07/26/2011   INTRAOPERATIVE CHOLANGIOGRAM  07/26/2011   Procedure: INTRAOPERATIVE CHOLANGIOGRAM;  Surgeon: Elspeth KYM Schultze, MD;  Location: [facility];  Service: General;  Laterality: N/A;   LIVER BIOPSY  07/26/2011   Procedure: LIVER BIOPSY;  Surgeon: Elspeth KYM Schultze, MD;  Location: [facility];  Service: General;  Laterality: N/A;  core liver biopsy    osteomilitis  1983   right femur   osteomylitis  1988   left femur   SHOULDER OPEN ROTATOR CUFF REPAIR Right 11/18/2014   Procedure: RIGHT SHOULDER MINI OPEN ROTATOR CUFF REPAIR SUBACROMIAL DECOMPRESSION  ;  Surgeon: Reyes Billing, MD;  Location: [facility];  Service: Orthopedics;  Laterality: Right;    Gynecologic History:  No LMP recorded (lmp unknown). (Menstrual status: IUD). Last Pap: Results were: 01/03/2023 NIL and HR HPV+  Last mammogram: 05/09/23 Results were: BI-RAD I  Obstetric History: [id number]  Family History:  Family History  Problem Relation Age of Onset   COPD Mother    Hypertension Mother    Hyperlipidemia Mother    Glaucoma Mother  Pulmonary embolism Mother        July 2024   Alcohol abuse Father    Colon cancer Father    Liver cancer Father    Kidney failure Brother        He was waiting to get a kidney   Cancer Maternal Aunt        breast   Breast cancer Paternal Aunt        unknown age   Cancer Maternal Aunt    Diabetes Maternal Aunt     Social History:  Social History   Socioeconomic History   Marital status: Married    Spouse name: Alm   Number of children: 2   Years of education: Not on file   Highest  education level: Some college, no degree  Occupational History   Not on file  Tobacco Use   Smoking status: Never   Smokeless tobacco: Never  Vaping Use   Vaping status: Never Used  Substance and Sexual Activity   Alcohol use: Yes    Alcohol/week: 0.0 standard drinks of alcohol    Comment: occassionally   Drug use: No   Sexual activity: Yes    Birth control/protection: I.U.D.    Comment: Mirena   Other Topics Concern   Not on file  Social History Narrative   Not on file   Social Drivers of Health   Tobacco Use: Low Risk (01/07/2024)   Patient History    Smoking Tobacco Use: Never    Smokeless Tobacco Use: Never    Passive Exposure: Not on file  Financial Resource Strain: Low Risk (11/22/2023)   Overall Financial Resource Strain (CARDIA)    Difficulty of Paying Living Expenses: Not hard at all  Food Insecurity: No Food Insecurity (11/22/2023)   Epic    Worried About Programme Researcher, Broadcasting/film/video in the Last Year: Never true    Ran Out of Food in the Last Year: Never true  Transportation Needs: No Transportation Needs (11/22/2023)   Epic    Lack of Transportation (Medical): No    Lack of Transportation (Non-Medical): No  Physical Activity: Insufficiently Active (11/22/2023)   Exercise Vital Sign    Days of Exercise per Week: 5 days    Minutes of Exercise per Session: 20 min  Stress: No Stress Concern Present (11/22/2023)   [employer] of Occupational Health - Occupational Stress Questionnaire    Feeling of Stress: Only a little  Social Connections: Socially Integrated (11/22/2023)   Social Connection and Isolation Panel    Frequency of Communication with Friends and Family: Three times a week    Frequency of Social Gatherings with Friends and Family: Twice a week    Attends Religious Services: 1 to 4 times per year    Active Member of Clubs or Organizations: Yes    Attends Banker Meetings: 1 to 4 times per year    Marital Status: Married  Catering Manager  Violence: Not on file  Depression (PHQ2-9): Low Risk (11/22/2023)   Depression (PHQ2-9)    PHQ-2 Score: 0  Alcohol Screen: Low Risk (11/22/2023)   Alcohol Screen    Last Alcohol Screening Score (AUDIT): 3  Housing: Low Risk (11/22/2023)   Epic    Unable to Pay for Housing in the Last Year: No    Number of Times Moved in the Last Year: 0    Homeless in the Last Year: No  Utilities: Not on file  Health Literacy: Not on file    Allergies:  Allergies[1]  Medications: Prior to Admission medications  Medication Sig Start Date End Date Taking? Authorizing Provider  Albuterol-Budesonide 90-80 MCG/ACT AERO Inhale 2 sprays into the lungs. 12/13/23  Yes [provider]  amLODipine  (NORVASC ) 5 MG tablet Take 1 tablet (5 mg total) by mouth daily. 11/22/23  Yes Sowles, Krichna, MD  b complex vitamins tablet Take 1 tablet by mouth daily.   Yes [provider]  buPROPion  (WELLBUTRIN  XL) 300 MG 24 hr tablet Take 1 tablet (300 mg total) by mouth daily. 11/22/23  Yes Sowles, Krichna, MD  colestipol (COLESTID) 1 g tablet Take 1 g by mouth 2 (two) times daily.   Yes Rollin Dover, MD  diclofenac  Sodium (VOLTAREN ) 1 % GEL Apply topically. 02/14/18  Yes [provider]  escitalopram  (LEXAPRO ) 10 MG tablet Take 1 tablet (10 mg total) by mouth daily. 08/16/23  Yes Sowles, Krichna, MD  FIBER PO Take 5 tablets by mouth 2 (two) times daily.   Yes [provider]  hydrOXYzine  (ATARAX ) 10 MG tablet Take 1 tablet (10 mg total) by mouth daily as needed. 11/22/23  Yes Sowles, Krichna, MD  ketotifen (ZADITOR) 0.025 % ophthalmic solution 1 drop 2 (two) times daily.   Yes [provider]  levonorgestrel  (MIRENA ) 20 MCG/24HR IUD 1 each by Intrauterine route once.    Yes [provider]  Magnesium  500 MG TABS  08/29/18  Yes [provider]  modafinil  (PROVIGIL ) 100 MG tablet Take 1 tablet (100 mg total) by mouth daily. 11/22/23  Yes Sowles, Krichna, MD  Multiple  Vitamin (MULTI-VITAMIN DAILY PO) Take 1 tablet by mouth daily with breakfast. Women's   Yes [provider]  Omega-3 Fatty Acids (FISH OIL) 1000 MG CAPS Take 1,000 mg by mouth 2 (two) times daily.   Yes [provider]  omeprazole  (PRILOSEC) 20 MG capsule Take 1 capsule (20 mg total) by mouth daily. 11/22/23  Yes Sowles, Krichna, MD  traMADol  (ULTRAM ) 50 MG tablet Take 1 tablet (50 mg total) by mouth 3 (three) times daily as needed. Chronic pain to last 90 days 11/22/23  Yes Sowles, Krichna, MD  traZODone  (DESYREL ) 50 MG tablet Take 1 tablet (50 mg total) by mouth at bedtime. 11/22/23  Yes Sowles, Krichna, MD  valsartan -hydrochlorothiazide  (DIOVAN -HCT) 320-25 MG tablet Take 1 tablet by mouth daily. 11/22/23  Yes Sowles, Krichna, MD  benzonatate (TESSALON) 200 MG capsule Take 200 mg by mouth 3 (three) times daily as needed. Patient not taking: Reported on 01/07/2024 12/13/23   [provider]    Physical Exam Vitals: There were no vitals taken for this visit.  General: NAD HEENT: normocephalic, anicteric Thyroid: no enlargement, no palpable nodules Pulmonary: No increased work of breathing, CTAB Cardiovascular: RRR, distal pulses 2+ Breast: Breast symmetrical, no tenderness, no palpable nodules or masses, no skin or nipple retraction present, no nipple discharge.  No axillary or supraclavicular lymphadenopathy. Abdomen: NABS, soft, non-tender, non-distended.  Umbilicus without lesions.  No hepatomegaly, splenomegaly or masses palpable. No evidence of hernia  Genitourinary:  External: Normal external female genitalia.  Normal urethral meatus, normal Bartholin's and Skene's glands.    Vagina: Normal vaginal mucosa, no evidence of prolapse.  Good tone  Cervix: Grossly normal in appearance, no bleeding  Uterus: Non-enlarged, mobile, normal contour.  No CMT  Adnexa: ovaries non-enlarged, no adnexal masses  Rectal: deferred  Lymphatic: no evidence of inguinal  lymphadenopathy Extremities: no edema, erythema, or tenderness Neurologic: Grossly intact Psychiatric: mood appropriate, affect full   Assessment: 58  y.o. [id number] routine annual exam  Plan: Problem List Items Addressed This Visit   None Visit Diagnoses       Well woman exam with routine gynecological exam    -  Primary     Screening for cervical cancer       Relevant Orders   Cytology - PAP       1) Mammogram - recommend yearly screening mammogram.  Mammogram Is up to date  2) STI screening  was notoffered and therefore not obtained  3) ASCCP guidelines and rational discussed.  Patient opts for based on results screening interval  4) Osteoporosis  - per USPTF routine screening DEXA at age 83   Consider FDA-approved medical therapies in postmenopausal women and men aged 34 years and older, based on the following: a) A hip or vertebral (clinical or morphometric) fracture b) T-score <= -2.5 at the femoral neck or spine after appropriate evaluation to exclude secondary causes C) Low bone mass (T-score between -1.0 and -2.5 at the femoral neck or spine) and a 10-year probability of a hip fracture >= 3% or a 10-year probability of a major osteoporosis-related fracture >= 20% based on the [place] -adapted WHO algorithm   5) Routine healthcare maintenance including cholesterol, diabetes screening discussed managed by PCP  6) Colonoscopy dure in 2030.  Screening recommended starting at age 60 for average risk individuals, age 73 for individuals deemed at increased risk (including African Americans) and recommended to continue until age 26.  For patient age 20-85 individualized approach is recommended.  Gold standard screening is via colonoscopy, Cologuard screening is an acceptable alternative for patient unwilling or unable to undergo colonoscopy.  Colorectal cancer screening for average?risk adults: 2018 guideline update from the American Cancer SocietyCA: A Cancer Journal for Clinicians:  Jun 13, 2016   7) No follow-ups on file.   Jinnie Cookey, CNM  [facility] 01/07/2024, 2:36 PM            12/23/20252:36 PM     [1]  Allergies Allergen Reactions   Percocet [Oxycodone -Acetaminophen ] Anaphylaxis    bronchospasms   Povidone Iodine Rash and Dermatitis    blisters  Other reaction(s): Unknown   Adhesive [Tape] Itching   Allergy Relief  [Chlorpheniramine Maleate]     Heart flutters   Oxycodone -Acetaminophen      Other reaction(s): Unknown   Chlorpheniramine     Other reaction(s): irregular heart rate   Diphenhydramine Rash   Penicillins Hives and Rash    Has patient had a PCN reaction causing immediate rash, facial/tongue/throat swelling, SOB or lightheadedness with hypotension: no Has patient had a PCN reaction causing severe rash involving mucus membranes or skin necrosis: no Has patient had a PCN reaction that required hospitalization happened in the hospital Has patient had a PCN reaction occurring within the last 10 years: no If all of the above answers are NO, then may proceed with Cephalosporin use.    "

## 2024-01-07 NOTE — Patient Instructions (Signed)
 Preventive Care 58-58 Years Old, Female Preventive care refers to lifestyle choices and visits with your health care provider that can promote health and wellness. Preventive care visits are also called wellness exams. What can I expect for my preventive care visit? Counseling Your health care provider may ask you questions about your: Medical history, including: Past medical problems. Family medical history. Pregnancy history. Current health, including: Menstrual cycle. Method of birth control. Emotional well-being. Home life and relationship well-being. Sexual activity and sexual health. Lifestyle, including: Alcohol, nicotine or tobacco, and drug use. Access to firearms. Diet, exercise, and sleep habits. Work and work Astronomer. Sunscreen use. Safety issues such as seatbelt and bike helmet use. Physical exam Your health care provider will check your: Height and weight. These may be used to calculate your BMI (body mass index). BMI is a measurement that tells if you are at a healthy weight. Waist circumference. This measures the distance around your waistline. This measurement also tells if you are at a healthy weight and may help predict your risk of certain diseases, such as type 2 diabetes and high blood pressure. Heart rate and blood pressure. Body temperature. Skin for abnormal spots. What immunizations do I need?  Vaccines are usually given at various ages, according to a schedule. Your health care provider will recommend vaccines for you based on your age, medical history, and lifestyle or other factors, such as travel or where you work. What tests do I need? Screening Your health care provider may recommend screening tests for certain conditions. This may include: Lipid and cholesterol levels. Diabetes screening. This is done by checking your blood sugar (glucose) after you have not eaten for a while (fasting). Pelvic exam and Pap test. Hepatitis B test. Hepatitis C  test. HIV (human immunodeficiency virus) test. STI (sexually transmitted infection) testing, if you are at risk. Lung cancer screening. Colorectal cancer screening. Mammogram. Talk with your health care provider about when you should start having regular mammograms. This may depend on whether you have a family history of breast cancer. BRCA-related cancer screening. This may be done if you have a family history of breast, ovarian, tubal, or peritoneal cancers. Bone density scan. This is done to screen for osteoporosis. Talk with your health care provider about your test results, treatment options, and if necessary, the need for more tests. Follow these instructions at home: Eating and drinking  Eat a diet that includes fresh fruits and vegetables, whole grains, lean protein, and low-fat dairy products. Take vitamin and mineral supplements as recommended by your health care provider. Do not drink alcohol if: Your health care provider tells you not to drink. You are pregnant, may be pregnant, or are planning to become pregnant. If you drink alcohol: Limit how much you have to 0-1 drink a day. Know how much alcohol is in your drink. In the [place] one drink equals one 12 oz bottle of beer (355 mL), one 5 oz glass of wine (148 mL), or one 1 oz glass of hard liquor (44 mL). Lifestyle Brush your teeth every morning and night with fluoride toothpaste. Floss one time each day. Exercise for at least 30 minutes 5 or more days each week. Do not use any products that contain nicotine or tobacco. These products include cigarettes, chewing tobacco, and vaping devices, such as e-cigarettes. If you need help quitting, ask your health care provider. Do not use drugs. If you are sexually active, practice safe sex. Use a condom or other form of protection to  prevent STIs. If you do not wish to become pregnant, use a form of birth control. If you plan to become pregnant, see your health care provider for a  prepregnancy visit. Take aspirin only as told by your health care provider. Make sure that you understand how much to take and what form to take. Work with your health care provider to find out whether it is safe and beneficial for you to take aspirin daily. Find healthy ways to manage stress, such as: Meditation, yoga, or listening to music. Journaling. Talking to a trusted person. Spending time with friends and family. Minimize exposure to UV radiation to reduce your risk of skin cancer. Safety Always wear your seat belt while driving or riding in a vehicle. Do not drive: If you have been drinking alcohol. Do not ride with someone who has been drinking. When you are tired or distracted. While texting. If you have been using any mind-altering substances or drugs. Wear a helmet and other protective equipment during sports activities. If you have firearms in your house, make sure you follow all gun safety procedures. Seek help if you have been physically or sexually abused. What's next? Visit your health care provider once a year for an annual wellness visit. Ask your health care provider how often you should have your eyes and teeth checked. Stay up to date on all vaccines. This information is not intended to replace advice given to you by your health care provider. Make sure you discuss any questions you have with your health care provider. Document Revised: 06/29/2020 Document Reviewed: 06/29/2020 Elsevier Patient Education  2024 [employer] How to Do a Breast Self-Exam Doing breast self-exams can help you stay healthy. They're one way to know what's normal for your breasts. They can help you catch a problem while it's still small and can be treated. You need to: Check your breasts often. Tell your doctor about any changes. You should do breast self-exams even if you have breast implants. What you need: A mirror. A well-lit room. A pillow or other soft object. How to do a  breast self-exam Look for changes  Take off all the clothes above your waist. Stand in front of a mirror in a room with good lighting. Put your hands down at your sides. Compare your breasts in the mirror. Look for difference between them, such as: Differences in shape. Differences in size. Wrinkles, dips, and bumps in one breast and not the other. Look at each breast for skin changes, such as: Redness. Scaly spots. Spots where your skin is thicker. Dimpling. Open sores. Look for changes in your nipples, such as: Fluid coming out of a nipple. Fluid around a nipple. Bleeding. Dimpling. Redness. A nipple that looks pushed in or that has changed position. Feel for changes Lie on your back. Feel each breast. To do this: Pick a breast to feel. Place a pillow under the shoulder closest to that breast. Put the arm closest to that breast behind your head. Feel the breast using the hand of your other arm. Use the pads of your three middle fingers to make small circles starting near the nipple. Use light, medium, and firm pressure. Keep making circles, moving down over the breast. Stop when you feel your ribs. Start making circles with your fingers again, this time going up until you reach your collarbone. Then, make circles out across your breast and into your armpit area. Squeeze your nipple. Check for fluid and lumps. Do these steps again  to check your other breast. Sit or stand in the tub or shower. With soapy water on your skin, feel each breast the same way you did when you were lying down. Write down what you find Writing down what you find can help you keep track of what you want to tell your doctor. Write down: What's normal for each breast. Any changes you find. Write down: The kind of change. If your breast feels tender or painful. Any lump you find. Write down its size and where it is. When you last had your period. General tips If you're breastfeeding, the best time  to check your breasts is after you feed your baby or after you use a breast pump. If you get a period, the best time to check your breasts is 5-7 days after your period ends. With time, you'll get more used to doing the self-exam. You'll also start to know if there are changes in your breasts. Contact a doctor if: You see a change in the shape or size of your breasts or nipples. You see a change in the skin of your breast or nipples. You have fluid coming from your nipples that isn't normal. You find a new lump or thick area. You have breast pain. You have any concerns about your breast health. This information is not intended to replace advice given to you by your health care provider. Make sure you discuss any questions you have with your health care provider. Document Revised: 03/13/2023 Document Reviewed: 03/13/2023 Elsevier Patient Education  2025 [employer].

## 2024-01-13 LAB — CYTOLOGY - PAP
Comment: NEGATIVE
Diagnosis: NEGATIVE
Diagnosis: REACTIVE
High risk HPV: NEGATIVE

## 2024-01-15 ENCOUNTER — Ambulatory Visit: Payer: Self-pay | Admitting: Licensed Practical Nurse

## 2024-01-27 ENCOUNTER — Other Ambulatory Visit (HOSPITAL_COMMUNITY): Payer: Self-pay

## 2024-01-28 ENCOUNTER — Other Ambulatory Visit: Payer: Self-pay | Admitting: Family Medicine

## 2024-01-28 DIAGNOSIS — G4709 Other insomnia: Secondary | ICD-10-CM

## 2024-01-28 NOTE — Telephone Encounter (Signed)
 Requested Prescriptions  Pending Prescriptions Disp Refills   traZODone  (DESYREL ) 50 MG tablet [Pharmacy Med Name: traZODone  HCl 50 MG Oral Tablet] 90 tablet 0    Sig: TAKE 1 TABLET BY MOUTH AT  BEDTIME     Psychiatry: Antidepressants - Serotonin Modulator Passed - 01/28/2024  4:27 PM      Passed - Completed PHQ-2 or PHQ-9 in the last 360 days      Passed - Valid encounter within last 6 months    Recent Outpatient Visits           2 months ago Morbid obesity [facility]   [facility] Glenard Mire, MD   5 months ago Morbid obesity [facility])   [facility] Glenard Mire, MD   9 months ago Morbid obesity [facility])   [facility] Glenard Mire, MD

## 2024-02-28 ENCOUNTER — Ambulatory Visit: Admitting: Family Medicine

## 2024-03-27 ENCOUNTER — Ambulatory Visit: Admitting: Family Medicine
